# Patient Record
Sex: Male | Born: 1960 | Race: White | Hispanic: No | Marital: Single | Smoking: Never smoker
Health system: Southern US, Community
[De-identification: ages and names within clinical notes are randomized; demographics above are authoritative.]

## PROBLEM LIST (undated history)

## (undated) DIAGNOSIS — M7580 Other shoulder lesions, unspecified shoulder: Secondary | ICD-10-CM

## (undated) DIAGNOSIS — C9211 Chronic myeloid leukemia, BCR/ABL-positive, in remission: Principal | ICD-10-CM

## (undated) DIAGNOSIS — N183 Chronic kidney disease, stage 3 (moderate): Secondary | ICD-10-CM

## (undated) DIAGNOSIS — T50905A Adverse effect of unspecified drugs, medicaments and biological substances, initial encounter: Secondary | ICD-10-CM

## (undated) DIAGNOSIS — I10 Essential (primary) hypertension: Secondary | ICD-10-CM

## (undated) DIAGNOSIS — C9590 Leukemia, unspecified not having achieved remission: Secondary | ICD-10-CM

## (undated) DIAGNOSIS — D721 Eosinophilia: Secondary | ICD-10-CM

## (undated) DIAGNOSIS — R1310 Dysphagia, unspecified: Principal | ICD-10-CM

## (undated) HISTORY — DX: Essential (primary) hypertension: I10

## (undated) HISTORY — DX: Chronic myeloid leukemia, BCR/ABL-positive, in remission: C92.11

## (undated) HISTORY — DX: Dysphagia, unspecified: R13.10

## (undated) HISTORY — DX: Leukemia, unspecified not having achieved remission: C95.90

## (undated) HISTORY — DX: Adverse effect of unspecified drugs, medicaments and biological substances, initial encounter: T50.905A

## (undated) HISTORY — DX: Eosinophilia: D72.1

## (undated) HISTORY — DX: Other shoulder lesions, unspecified shoulder: M75.80

## (undated) HISTORY — DX: Chronic kidney disease, stage 3 (moderate): N18.3

---

## 2002-04-30 DIAGNOSIS — C9590 Leukemia, unspecified not having achieved remission: Secondary | ICD-10-CM

## 2002-04-30 HISTORY — DX: Leukemia, unspecified not having achieved remission: C95.90

## 2003-04-15 ENCOUNTER — Encounter: Admission: RE | Admit: 2003-04-15 | Discharge: 2003-04-15 | Payer: Self-pay | Admitting: Infectious Diseases

## 2003-04-16 ENCOUNTER — Other Ambulatory Visit: Admission: RE | Admit: 2003-04-16 | Discharge: 2003-04-16 | Payer: Self-pay | Admitting: Oncology

## 2003-04-19 DIAGNOSIS — C9211 Chronic myeloid leukemia, BCR/ABL-positive, in remission: Secondary | ICD-10-CM

## 2003-04-19 HISTORY — DX: Chronic myeloid leukemia, BCR/ABL-positive, in remission: C92.11

## 2003-04-20 ENCOUNTER — Ambulatory Visit (HOSPITAL_COMMUNITY): Admission: RE | Admit: 2003-04-20 | Discharge: 2003-04-20 | Payer: Self-pay | Admitting: Oncology

## 2003-04-20 ENCOUNTER — Encounter (INDEPENDENT_AMBULATORY_CARE_PROVIDER_SITE_OTHER): Payer: Self-pay | Admitting: *Deleted

## 2006-01-29 ENCOUNTER — Ambulatory Visit: Payer: Self-pay | Admitting: Oncology

## 2006-03-06 LAB — COMPREHENSIVE METABOLIC PANEL
ALT: 19 U/L (ref 0–53)
Albumin: 4.4 g/dL (ref 3.5–5.2)
CO2: 29 mEq/L (ref 19–32)
Calcium: 9.1 mg/dL (ref 8.4–10.5)
Chloride: 103 mEq/L (ref 96–112)
Glucose, Bld: 98 mg/dL (ref 70–99)
Potassium: 3.9 mEq/L (ref 3.5–5.3)
Sodium: 139 mEq/L (ref 135–145)
Total Bilirubin: 0.6 mg/dL (ref 0.3–1.2)
Total Protein: 6.7 g/dL (ref 6.0–8.3)

## 2006-03-06 LAB — MORPHOLOGY: PLT EST: ADEQUATE

## 2006-03-06 LAB — CBC WITH DIFFERENTIAL/PLATELET
BASO%: 0.6 % (ref 0.0–2.0)
LYMPH%: 31.3 % (ref 14.0–48.0)
MCHC: 33.9 g/dL (ref 32.0–35.9)
MCV: 92.8 fL (ref 81.6–98.0)
MONO#: 0.4 10*3/uL (ref 0.1–0.9)
MONO%: 7.1 % (ref 0.0–13.0)
NEUT#: 3.1 10*3/uL (ref 1.5–6.5)
Platelets: 194 10*3/uL (ref 145–400)
RBC: 3.48 10*6/uL — ABNORMAL LOW (ref 4.20–5.71)
RDW: 13.7 % (ref 11.2–14.6)
WBC: 5.5 10*3/uL (ref 4.0–10.0)

## 2006-03-06 LAB — LACTATE DEHYDROGENASE: LDH: 199 U/L (ref 94–250)

## 2006-04-01 ENCOUNTER — Ambulatory Visit: Payer: Self-pay | Admitting: Oncology

## 2006-04-03 LAB — CBC WITH DIFFERENTIAL/PLATELET
Basophils Absolute: 0 10*3/uL (ref 0.0–0.1)
Eosinophils Absolute: 0.3 10*3/uL (ref 0.0–0.5)
HGB: 11 g/dL — ABNORMAL LOW (ref 13.0–17.1)
MCV: 91.7 fL (ref 81.6–98.0)
MONO#: 0.5 10*3/uL (ref 0.1–0.9)
MONO%: 7.9 % (ref 0.0–13.0)
NEUT#: 3.7 10*3/uL (ref 1.5–6.5)
Platelets: 241 10*3/uL (ref 145–400)
RDW: 13.1 % (ref 11.2–14.6)
WBC: 6.2 10*3/uL (ref 4.0–10.0)

## 2006-04-03 LAB — COMPREHENSIVE METABOLIC PANEL
Albumin: 4.6 g/dL (ref 3.5–5.2)
Alkaline Phosphatase: 69 U/L (ref 39–117)
BUN: 20 mg/dL (ref 6–23)
Calcium: 9.4 mg/dL (ref 8.4–10.5)
Chloride: 104 mEq/L (ref 96–112)
Glucose, Bld: 108 mg/dL — ABNORMAL HIGH (ref 70–99)
Potassium: 4.2 mEq/L (ref 3.5–5.3)
Sodium: 142 mEq/L (ref 135–145)
Total Protein: 6.7 g/dL (ref 6.0–8.3)

## 2006-04-03 LAB — MORPHOLOGY

## 2006-05-03 LAB — CBC WITH DIFFERENTIAL/PLATELET
BASO%: 0.7 % (ref 0.0–2.0)
EOS%: 5.2 % (ref 0.0–7.0)
HCT: 33.2 % — ABNORMAL LOW (ref 38.7–49.9)
MCH: 31.1 pg (ref 28.0–33.4)
MCHC: 33.8 g/dL (ref 32.0–35.9)
MONO#: 0.4 10*3/uL (ref 0.1–0.9)
NEUT%: 60.7 % (ref 40.0–75.0)
RBC: 3.61 10*6/uL — ABNORMAL LOW (ref 4.20–5.71)
WBC: 5.8 10*3/uL (ref 4.0–10.0)
lymph#: 1.5 10*3/uL (ref 0.9–3.3)

## 2006-05-03 LAB — MORPHOLOGY: RBC Comments: NORMAL

## 2006-05-03 LAB — COMPREHENSIVE METABOLIC PANEL
AST: 25 U/L (ref 0–37)
BUN: 22 mg/dL (ref 6–23)
CO2: 28 mEq/L (ref 19–32)
Calcium: 9.3 mg/dL (ref 8.4–10.5)
Chloride: 103 mEq/L (ref 96–112)
Creatinine, Ser: 1.17 mg/dL (ref 0.40–1.50)
Total Bilirubin: 0.5 mg/dL (ref 0.3–1.2)

## 2006-05-03 LAB — LACTATE DEHYDROGENASE: LDH: 190 U/L (ref 94–250)

## 2006-05-28 ENCOUNTER — Ambulatory Visit: Payer: Self-pay | Admitting: Oncology

## 2006-05-30 LAB — CBC WITH DIFFERENTIAL/PLATELET
Basophils Absolute: 0 10*3/uL (ref 0.0–0.1)
EOS%: 5.7 % (ref 0.0–7.0)
Eosinophils Absolute: 0.3 10*3/uL (ref 0.0–0.5)
HGB: 11 g/dL — ABNORMAL LOW (ref 13.0–17.1)
NEUT#: 2.3 10*3/uL (ref 1.5–6.5)
RBC: 3.45 10*6/uL — ABNORMAL LOW (ref 4.20–5.71)
RDW: 12.8 % (ref 11.2–14.6)
WBC: 4.4 10*3/uL (ref 4.0–10.0)
lymph#: 1.2 10*3/uL (ref 0.9–3.3)

## 2006-05-30 LAB — MORPHOLOGY

## 2006-06-06 LAB — BCR/ABL GENE REARRANGEMENT QNT, PCR
BCR/ABL Previous Quant 1: NEGATIVE
BCR/ABL Quantitative 1: POSITIVE — AB
BCR/ABL ratio: 0.0005

## 2006-06-06 LAB — COMPREHENSIVE METABOLIC PANEL WITH GFR
ALT: 21 U/L (ref 0–53)
AST: 24 U/L (ref 0–37)
Albumin: 4.4 g/dL (ref 3.5–5.2)
Alkaline Phosphatase: 66 U/L (ref 39–117)
BUN: 22 mg/dL (ref 6–23)
CO2: 28 meq/L (ref 19–32)
Calcium: 8.7 mg/dL (ref 8.4–10.5)
Chloride: 103 meq/L (ref 96–112)
Creatinine, Ser: 1.15 mg/dL (ref 0.40–1.50)
Glucose, Bld: 105 mg/dL — ABNORMAL HIGH (ref 70–99)
Potassium: 3.7 meq/L (ref 3.5–5.3)
Sodium: 143 meq/L (ref 135–145)
Total Bilirubin: 0.6 mg/dL (ref 0.3–1.2)
Total Protein: 6.7 g/dL (ref 6.0–8.3)

## 2006-06-06 LAB — BCR/ABL QUALITATIVE: BCR/ABL t(9,22): POSITIVE — AB

## 2006-06-06 LAB — LACTATE DEHYDROGENASE: LDH: 181 U/L (ref 94–250)

## 2006-07-09 LAB — COMPREHENSIVE METABOLIC PANEL
Albumin: 4.4 g/dL (ref 3.5–5.2)
BUN: 17 mg/dL (ref 6–23)
CO2: 28 mEq/L (ref 19–32)
Calcium: 9.3 mg/dL (ref 8.4–10.5)
Chloride: 104 mEq/L (ref 96–112)
Glucose, Bld: 108 mg/dL — ABNORMAL HIGH (ref 70–99)
Potassium: 3.9 mEq/L (ref 3.5–5.3)

## 2006-07-09 LAB — CBC WITH DIFFERENTIAL/PLATELET
Basophils Absolute: 0 10*3/uL (ref 0.0–0.1)
EOS%: 5.8 % (ref 0.0–7.0)
HCT: 30.9 % — ABNORMAL LOW (ref 38.7–49.9)
HGB: 10.7 g/dL — ABNORMAL LOW (ref 13.0–17.1)
MCH: 31.5 pg (ref 28.0–33.4)
MCHC: 34.7 g/dL (ref 32.0–35.9)
MCV: 90.6 fL (ref 81.6–98.0)
MONO%: 9.4 % (ref 0.0–13.0)
NEUT%: 49.8 % (ref 40.0–75.0)

## 2006-08-07 ENCOUNTER — Ambulatory Visit: Payer: Self-pay | Admitting: Oncology

## 2006-08-12 LAB — CBC WITH DIFFERENTIAL/PLATELET
Eosinophils Absolute: 0.4 10*3/uL (ref 0.0–0.5)
HGB: 11.1 g/dL — ABNORMAL LOW (ref 13.0–17.1)
MONO#: 0.6 10*3/uL (ref 0.1–0.9)
MONO%: 9.9 % (ref 0.0–13.0)
NEUT#: 2.6 10*3/uL (ref 1.5–6.5)
RBC: 3.53 10*6/uL — ABNORMAL LOW (ref 4.20–5.71)
RDW: 13.3 % (ref 11.2–14.6)
WBC: 5.7 10*3/uL (ref 4.0–10.0)
lymph#: 2.1 10*3/uL (ref 0.9–3.3)

## 2006-08-12 LAB — MORPHOLOGY: PLT EST: ADEQUATE

## 2006-08-12 LAB — COMPREHENSIVE METABOLIC PANEL
ALT: 18 U/L (ref 0–53)
AST: 22 U/L (ref 0–37)
Albumin: 4.7 g/dL (ref 3.5–5.2)
Alkaline Phosphatase: 76 U/L (ref 39–117)
BUN: 18 mg/dL (ref 6–23)
Potassium: 4.3 mEq/L (ref 3.5–5.3)

## 2006-09-09 LAB — CBC WITH DIFFERENTIAL/PLATELET
Basophils Absolute: 0 10*3/uL (ref 0.0–0.1)
Eosinophils Absolute: 0.4 10*3/uL (ref 0.0–0.5)
HGB: 11.2 g/dL — ABNORMAL LOW (ref 13.0–17.1)
LYMPH%: 34.3 % (ref 14.0–48.0)
MCV: 91.1 fL (ref 81.6–98.0)
MONO%: 8.7 % (ref 0.0–13.0)
NEUT#: 2.3 10*3/uL (ref 1.5–6.5)
Platelets: 208 10*3/uL (ref 145–400)
RBC: 3.53 10*6/uL — ABNORMAL LOW (ref 4.20–5.71)

## 2006-09-09 LAB — MORPHOLOGY

## 2006-09-11 LAB — COMPREHENSIVE METABOLIC PANEL
AST: 20 U/L (ref 0–37)
Albumin: 4.7 g/dL (ref 3.5–5.2)
BUN: 17 mg/dL (ref 6–23)
CO2: 33 mEq/L — ABNORMAL HIGH (ref 19–32)
Calcium: 9.6 mg/dL (ref 8.4–10.5)
Chloride: 103 mEq/L (ref 96–112)
Creatinine, Ser: 1.25 mg/dL (ref 0.40–1.50)
Glucose, Bld: 101 mg/dL — ABNORMAL HIGH (ref 70–99)
Potassium: 4 mEq/L (ref 3.5–5.3)

## 2006-09-11 LAB — LACTATE DEHYDROGENASE: LDH: 175 U/L (ref 94–250)

## 2006-09-17 LAB — BCR/ABL GENE REARRANGEMENT QNT, PCR

## 2006-10-10 ENCOUNTER — Ambulatory Visit: Payer: Self-pay | Admitting: Oncology

## 2006-10-14 LAB — CBC WITH DIFFERENTIAL/PLATELET
Basophils Absolute: 0 10*3/uL (ref 0.0–0.1)
Eosinophils Absolute: 0.2 10*3/uL (ref 0.0–0.5)
HGB: 10.7 g/dL — ABNORMAL LOW (ref 13.0–17.1)
MONO#: 0.7 10*3/uL (ref 0.1–0.9)
NEUT#: 4.2 10*3/uL (ref 1.5–6.5)
Platelets: 207 10*3/uL (ref 145–400)
RBC: 3.38 10*6/uL — ABNORMAL LOW (ref 4.20–5.71)
RDW: 12.8 % (ref 11.2–14.6)
WBC: 6.9 10*3/uL (ref 4.0–10.0)

## 2006-10-14 LAB — COMPREHENSIVE METABOLIC PANEL
Albumin: 4.5 g/dL (ref 3.5–5.2)
BUN: 17 mg/dL (ref 6–23)
Calcium: 9.3 mg/dL (ref 8.4–10.5)
Chloride: 107 mEq/L (ref 96–112)
Glucose, Bld: 88 mg/dL (ref 70–99)
Potassium: 4 mEq/L (ref 3.5–5.3)

## 2006-11-11 LAB — CBC WITH DIFFERENTIAL/PLATELET
Eosinophils Absolute: 0.3 10*3/uL (ref 0.0–0.5)
MONO#: 0.4 10*3/uL (ref 0.1–0.9)
MONO%: 7.4 % (ref 0.0–13.0)
NEUT#: 2.5 10*3/uL (ref 1.5–6.5)
RBC: 3.4 10*6/uL — ABNORMAL LOW (ref 4.20–5.71)
RDW: 12.7 % (ref 11.2–14.6)
WBC: 4.7 10*3/uL (ref 4.0–10.0)

## 2006-11-11 LAB — COMPREHENSIVE METABOLIC PANEL
ALT: 12 U/L (ref 0–53)
Albumin: 4.5 g/dL (ref 3.5–5.2)
CO2: 26 mEq/L (ref 19–32)
Calcium: 9.4 mg/dL (ref 8.4–10.5)
Chloride: 102 mEq/L (ref 96–112)
Glucose, Bld: 109 mg/dL — ABNORMAL HIGH (ref 70–99)
Potassium: 4 mEq/L (ref 3.5–5.3)
Sodium: 142 mEq/L (ref 135–145)
Total Protein: 6.5 g/dL (ref 6.0–8.3)

## 2006-11-11 LAB — MORPHOLOGY: PLT EST: ADEQUATE

## 2006-12-12 ENCOUNTER — Ambulatory Visit: Payer: Self-pay | Admitting: Oncology

## 2006-12-16 LAB — COMPREHENSIVE METABOLIC PANEL
Albumin: 4.5 g/dL (ref 3.5–5.2)
Alkaline Phosphatase: 71 U/L (ref 39–117)
Glucose, Bld: 83 mg/dL (ref 70–99)
Potassium: 4 mEq/L (ref 3.5–5.3)
Sodium: 139 mEq/L (ref 135–145)
Total Protein: 6.9 g/dL (ref 6.0–8.3)

## 2006-12-16 LAB — CBC WITH DIFFERENTIAL/PLATELET
Basophils Absolute: 0 10*3/uL (ref 0.0–0.1)
Eosinophils Absolute: 0.3 10*3/uL (ref 0.0–0.5)
HCT: 32.7 % — ABNORMAL LOW (ref 38.7–49.9)
HGB: 11.4 g/dL — ABNORMAL LOW (ref 13.0–17.1)
LYMPH%: 24 % (ref 14.0–48.0)
MCV: 89.9 fL (ref 81.6–98.0)
MONO%: 9.6 % (ref 0.0–13.0)
NEUT#: 4.4 10*3/uL (ref 1.5–6.5)
Platelets: 221 10*3/uL (ref 145–400)
RDW: 12.3 % (ref 11.2–14.6)

## 2006-12-16 LAB — MORPHOLOGY

## 2006-12-16 LAB — LACTATE DEHYDROGENASE: LDH: 187 U/L (ref 94–250)

## 2006-12-25 LAB — BCR/ABL

## 2007-01-31 ENCOUNTER — Ambulatory Visit: Payer: Self-pay | Admitting: Oncology

## 2007-02-04 LAB — COMPREHENSIVE METABOLIC PANEL
AST: 20 U/L (ref 0–37)
Alkaline Phosphatase: 69 U/L (ref 39–117)
BUN: 17 mg/dL (ref 6–23)
Calcium: 9.2 mg/dL (ref 8.4–10.5)
Creatinine, Ser: 1.18 mg/dL (ref 0.40–1.50)
Total Bilirubin: 0.7 mg/dL (ref 0.3–1.2)

## 2007-02-04 LAB — CBC WITH DIFFERENTIAL/PLATELET
BASO%: 0.5 % (ref 0.0–2.0)
EOS%: 5.7 % (ref 0.0–7.0)
MCH: 31.5 pg (ref 28.0–33.4)
MCV: 89 fL (ref 81.6–98.0)
MONO%: 9.4 % (ref 0.0–13.0)
RBC: 3.69 10*6/uL — ABNORMAL LOW (ref 4.20–5.71)
RDW: 12.5 % (ref 11.2–14.6)
lymph#: 1.7 10*3/uL (ref 0.9–3.3)

## 2007-02-04 LAB — MORPHOLOGY
PLT EST: ADEQUATE
RBC Comments: NORMAL

## 2007-03-04 LAB — COMPREHENSIVE METABOLIC PANEL
ALT: 16 U/L (ref 0–53)
AST: 20 U/L (ref 0–37)
Albumin: 4.4 g/dL (ref 3.5–5.2)
BUN: 15 mg/dL (ref 6–23)
CO2: 27 mEq/L (ref 19–32)
Calcium: 9.7 mg/dL (ref 8.4–10.5)
Chloride: 101 mEq/L (ref 96–112)
Potassium: 3.9 mEq/L (ref 3.5–5.3)

## 2007-03-04 LAB — CBC WITH DIFFERENTIAL/PLATELET
Basophils Absolute: 0 10*3/uL (ref 0.0–0.1)
EOS%: 6.7 % (ref 0.0–7.0)
HCT: 32.3 % — ABNORMAL LOW (ref 38.7–49.9)
HGB: 11.4 g/dL — ABNORMAL LOW (ref 13.0–17.1)
LYMPH%: 36.1 % (ref 14.0–48.0)
MCH: 31.4 pg (ref 28.0–33.4)
MCHC: 35.4 g/dL (ref 32.0–35.9)
MCV: 88.7 fL (ref 81.6–98.0)
NEUT%: 47.1 % (ref 40.0–75.0)
Platelets: 215 10*3/uL (ref 145–400)
lymph#: 2 10*3/uL (ref 0.9–3.3)

## 2007-03-04 LAB — LACTATE DEHYDROGENASE: LDH: 174 U/L (ref 94–250)

## 2007-03-04 LAB — MORPHOLOGY

## 2007-03-26 ENCOUNTER — Ambulatory Visit: Payer: Self-pay | Admitting: Oncology

## 2007-04-01 LAB — COMPREHENSIVE METABOLIC PANEL
ALT: 15 U/L (ref 0–53)
Albumin: 4.5 g/dL (ref 3.5–5.2)
CO2: 27 mEq/L (ref 19–32)
Calcium: 9.5 mg/dL (ref 8.4–10.5)
Chloride: 102 mEq/L (ref 96–112)
Potassium: 4.2 mEq/L (ref 3.5–5.3)
Sodium: 140 mEq/L (ref 135–145)
Total Protein: 7 g/dL (ref 6.0–8.3)

## 2007-04-01 LAB — CBC WITH DIFFERENTIAL/PLATELET
EOS%: 5.2 % (ref 0.0–7.0)
MCH: 31.2 pg (ref 28.0–33.4)
MCV: 88.3 fL (ref 81.6–98.0)
MONO%: 8.5 % (ref 0.0–13.0)
NEUT#: 2.2 10*3/uL (ref 1.5–6.5)
RBC: 3.67 10*6/uL — ABNORMAL LOW (ref 4.20–5.71)
RDW: 12.4 % (ref 11.2–14.6)

## 2007-04-01 LAB — MORPHOLOGY: RBC Comments: NORMAL

## 2007-04-01 LAB — LACTATE DEHYDROGENASE: LDH: 169 U/L (ref 94–250)

## 2007-05-08 ENCOUNTER — Ambulatory Visit: Payer: Self-pay | Admitting: Oncology

## 2007-05-12 LAB — CBC WITH DIFFERENTIAL/PLATELET
Basophils Absolute: 0.1 10*3/uL (ref 0.0–0.1)
EOS%: 5.6 % (ref 0.0–7.0)
HGB: 11.2 g/dL — ABNORMAL LOW (ref 13.0–17.1)
MCH: 30.1 pg (ref 28.0–33.4)
MCHC: 33.9 g/dL (ref 32.0–35.9)
MCV: 88.8 fL (ref 81.6–98.0)
MONO%: 7.7 % (ref 0.0–13.0)
RBC: 3.72 10*6/uL — ABNORMAL LOW (ref 4.20–5.71)
RDW: 12.4 % (ref 11.2–14.6)

## 2007-05-12 LAB — COMPREHENSIVE METABOLIC PANEL
ALT: 15 U/L (ref 0–53)
AST: 18 U/L (ref 0–37)
Alkaline Phosphatase: 70 U/L (ref 39–117)
Calcium: 9.5 mg/dL (ref 8.4–10.5)
Chloride: 106 mEq/L (ref 96–112)
Creatinine, Ser: 1.09 mg/dL (ref 0.40–1.50)
Potassium: 3.9 mEq/L (ref 3.5–5.3)

## 2007-05-12 LAB — MORPHOLOGY

## 2007-06-09 LAB — COMPREHENSIVE METABOLIC PANEL
AST: 22 U/L (ref 0–37)
Alkaline Phosphatase: 71 U/L (ref 39–117)
BUN: 21 mg/dL (ref 6–23)
Calcium: 9 mg/dL (ref 8.4–10.5)
Creatinine, Ser: 1.2 mg/dL (ref 0.40–1.50)
Glucose, Bld: 95 mg/dL (ref 70–99)

## 2007-06-09 LAB — CBC WITH DIFFERENTIAL/PLATELET
EOS%: 4.2 % (ref 0.0–7.0)
MCH: 29.2 pg (ref 28.0–33.4)
MCHC: 32.8 g/dL (ref 32.0–35.9)
MCV: 88.9 fL (ref 81.6–98.0)
MONO%: 7.3 % (ref 0.0–13.0)
RBC: 3.94 10*6/uL — ABNORMAL LOW (ref 4.20–5.71)
RDW: 12.4 % (ref 11.2–14.6)

## 2007-06-09 LAB — MORPHOLOGY: RBC Comments: NORMAL

## 2007-07-03 ENCOUNTER — Ambulatory Visit: Payer: Self-pay | Admitting: Oncology

## 2007-07-07 LAB — COMPREHENSIVE METABOLIC PANEL
Albumin: 4.4 g/dL (ref 3.5–5.2)
Alkaline Phosphatase: 73 U/L (ref 39–117)
BUN: 19 mg/dL (ref 6–23)
CO2: 25 mEq/L (ref 19–32)
Calcium: 8.9 mg/dL (ref 8.4–10.5)
Glucose, Bld: 87 mg/dL (ref 70–99)
Potassium: 3.8 mEq/L (ref 3.5–5.3)

## 2007-07-07 LAB — CBC WITH DIFFERENTIAL/PLATELET
Eosinophils Absolute: 0.2 10*3/uL (ref 0.0–0.5)
LYMPH%: 34.8 % (ref 14.0–48.0)
MCHC: 34.7 g/dL (ref 32.0–35.9)
MCV: 88.5 fL (ref 81.6–98.0)
MONO%: 10.2 % (ref 0.0–13.0)
NEUT#: 2.5 10*3/uL (ref 1.5–6.5)
NEUT%: 49.9 % (ref 40.0–75.0)
Platelets: 213 10*3/uL (ref 145–400)
RBC: 3.64 10*6/uL — ABNORMAL LOW (ref 4.20–5.71)

## 2007-07-07 LAB — MORPHOLOGY: RBC Comments: NORMAL

## 2007-08-04 LAB — COMPREHENSIVE METABOLIC PANEL
ALT: 14 U/L (ref 0–53)
AST: 16 U/L (ref 0–37)
Albumin: 4.4 g/dL (ref 3.5–5.2)
CO2: 26 mEq/L (ref 19–32)
Calcium: 9.3 mg/dL (ref 8.4–10.5)
Chloride: 105 mEq/L (ref 96–112)
Creatinine, Ser: 1.11 mg/dL (ref 0.40–1.50)
Potassium: 4 mEq/L (ref 3.5–5.3)
Total Protein: 6.7 g/dL (ref 6.0–8.3)

## 2007-08-04 LAB — CBC WITH DIFFERENTIAL/PLATELET
BASO%: 0.5 % (ref 0.0–2.0)
Basophils Absolute: 0 10*3/uL (ref 0.0–0.1)
EOS%: 3.7 % (ref 0.0–7.0)
HGB: 11.1 g/dL — ABNORMAL LOW (ref 13.0–17.1)
MCH: 30.9 pg (ref 28.0–33.4)
MONO#: 0.5 10*3/uL (ref 0.1–0.9)
NEUT#: 2.7 10*3/uL (ref 1.5–6.5)
RDW: 13.2 % (ref 11.2–14.6)
WBC: 5.4 10*3/uL (ref 4.0–10.0)
lymph#: 1.9 10*3/uL (ref 0.9–3.3)

## 2007-08-04 LAB — MORPHOLOGY: PLT EST: ADEQUATE

## 2007-08-04 LAB — LACTATE DEHYDROGENASE: LDH: 155 U/L (ref 94–250)

## 2007-08-13 ENCOUNTER — Ambulatory Visit: Payer: Self-pay | Admitting: Oncology

## 2007-09-17 LAB — CBC WITH DIFFERENTIAL/PLATELET
Basophils Absolute: 0 10*3/uL (ref 0.0–0.1)
EOS%: 5.4 % (ref 0.0–7.0)
Eosinophils Absolute: 0.3 10*3/uL (ref 0.0–0.5)
HGB: 11.5 g/dL — ABNORMAL LOW (ref 13.0–17.1)
NEUT#: 2.4 10*3/uL (ref 1.5–6.5)
RDW: 12.5 % (ref 11.2–14.6)
WBC: 5 10*3/uL (ref 4.0–10.0)
lymph#: 1.9 10*3/uL (ref 0.9–3.3)

## 2007-09-17 LAB — COMPREHENSIVE METABOLIC PANEL
ALT: 17 U/L (ref 0–53)
BUN: 18 mg/dL (ref 6–23)
CO2: 24 mEq/L (ref 19–32)
Calcium: 9.4 mg/dL (ref 8.4–10.5)
Chloride: 104 mEq/L (ref 96–112)
Creatinine, Ser: 1.19 mg/dL (ref 0.40–1.50)
Glucose, Bld: 91 mg/dL (ref 70–99)

## 2007-09-17 LAB — MORPHOLOGY: PLT EST: ADEQUATE

## 2007-09-17 LAB — LACTATE DEHYDROGENASE: LDH: 183 U/L (ref 94–250)

## 2007-10-13 ENCOUNTER — Ambulatory Visit: Payer: Self-pay | Admitting: Oncology

## 2007-10-15 LAB — CBC WITH DIFFERENTIAL/PLATELET
Basophils Absolute: 0 10*3/uL (ref 0.0–0.1)
EOS%: 6.8 % (ref 0.0–7.0)
HCT: 33.3 % — ABNORMAL LOW (ref 38.7–49.9)
HGB: 11.5 g/dL — ABNORMAL LOW (ref 13.0–17.1)
MCH: 31.2 pg (ref 28.0–33.4)
MCHC: 34.6 g/dL (ref 32.0–35.9)
MCV: 90.2 fL (ref 81.6–98.0)
MONO%: 8.6 % (ref 0.0–13.0)
NEUT%: 51.6 % (ref 40.0–75.0)
RDW: 12.6 % (ref 11.2–14.6)

## 2007-10-15 LAB — COMPREHENSIVE METABOLIC PANEL
BUN: 20 mg/dL (ref 6–23)
CO2: 26 mEq/L (ref 19–32)
Calcium: 9.4 mg/dL (ref 8.4–10.5)
Chloride: 104 mEq/L (ref 96–112)
Creatinine, Ser: 1.08 mg/dL (ref 0.40–1.50)

## 2007-10-15 LAB — LACTATE DEHYDROGENASE: LDH: 167 U/L (ref 94–250)

## 2007-11-12 LAB — CBC WITH DIFFERENTIAL/PLATELET
Basophils Absolute: 0.1 10*3/uL (ref 0.0–0.1)
Eosinophils Absolute: 0.4 10*3/uL (ref 0.0–0.5)
HGB: 11.6 g/dL — ABNORMAL LOW (ref 13.0–17.1)
NEUT#: 2.5 10*3/uL (ref 1.5–6.5)
RDW: 11.7 % (ref 11.2–14.6)
lymph#: 2.1 10*3/uL (ref 0.9–3.3)

## 2007-11-12 LAB — MORPHOLOGY
Blasts: 1 % (ref 0–0)
PLT EST: ADEQUATE

## 2007-11-12 LAB — COMPREHENSIVE METABOLIC PANEL
ALT: 16 U/L (ref 0–53)
Alkaline Phosphatase: 69 U/L (ref 39–117)
Glucose, Bld: 97 mg/dL (ref 70–99)
Sodium: 139 mEq/L (ref 135–145)
Total Bilirubin: 0.5 mg/dL (ref 0.3–1.2)
Total Protein: 6.6 g/dL (ref 6.0–8.3)

## 2007-12-04 ENCOUNTER — Ambulatory Visit: Payer: Self-pay | Admitting: Oncology

## 2007-12-08 LAB — MORPHOLOGY: PLT EST: ADEQUATE

## 2007-12-08 LAB — COMPREHENSIVE METABOLIC PANEL
AST: 19 U/L (ref 0–37)
BUN: 18 mg/dL (ref 6–23)
CO2: 25 mEq/L (ref 19–32)
Calcium: 9.1 mg/dL (ref 8.4–10.5)
Chloride: 105 mEq/L (ref 96–112)
Creatinine, Ser: 1.24 mg/dL (ref 0.40–1.50)

## 2007-12-08 LAB — CBC WITH DIFFERENTIAL/PLATELET
Basophils Absolute: 0 10*3/uL (ref 0.0–0.1)
Eosinophils Absolute: 0.3 10*3/uL (ref 0.0–0.5)
HGB: 11.4 g/dL — ABNORMAL LOW (ref 13.0–17.1)
MCV: 91.7 fL (ref 81.6–98.0)
MONO#: 0.4 10*3/uL (ref 0.1–0.9)
MONO%: 7.8 % (ref 0.0–13.0)
NEUT#: 2.2 10*3/uL (ref 1.5–6.5)
RDW: 12.7 % (ref 11.2–14.6)
lymph#: 1.7 10*3/uL (ref 0.9–3.3)

## 2007-12-08 LAB — LACTATE DEHYDROGENASE: LDH: 178 U/L (ref 94–250)

## 2008-01-20 ENCOUNTER — Ambulatory Visit: Payer: Self-pay | Admitting: Oncology

## 2008-01-20 LAB — COMPREHENSIVE METABOLIC PANEL
AST: 18 U/L (ref 0–37)
BUN: 15 mg/dL (ref 6–23)
Calcium: 8.7 mg/dL (ref 8.4–10.5)
Chloride: 103 mEq/L (ref 96–112)
Creatinine, Ser: 1.19 mg/dL (ref 0.40–1.50)

## 2008-01-20 LAB — CBC WITH DIFFERENTIAL/PLATELET
BASO%: 0.7 % (ref 0.0–2.0)
Basophils Absolute: 0 10*3/uL (ref 0.0–0.1)
EOS%: 5.6 % (ref 0.0–7.0)
HCT: 32.4 % — ABNORMAL LOW (ref 38.7–49.9)
HGB: 11.3 g/dL — ABNORMAL LOW (ref 13.0–17.1)
MCH: 31.7 pg (ref 28.0–33.4)
MCHC: 34.8 g/dL (ref 32.0–35.9)
MCV: 91 fL (ref 81.6–98.0)
MONO%: 8.6 % (ref 0.0–13.0)
NEUT%: 52.3 % (ref 40.0–75.0)
RDW: 12.7 % (ref 11.2–14.6)

## 2008-02-16 LAB — COMPREHENSIVE METABOLIC PANEL
ALT: 17 U/L (ref 0–53)
AST: 22 U/L (ref 0–37)
CO2: 27 mEq/L (ref 19–32)
Calcium: 8.7 mg/dL (ref 8.4–10.5)
Chloride: 103 mEq/L (ref 96–112)
Creatinine, Ser: 1.15 mg/dL (ref 0.40–1.50)
Potassium: 3.9 mEq/L (ref 3.5–5.3)
Sodium: 138 mEq/L (ref 135–145)
Total Protein: 6.8 g/dL (ref 6.0–8.3)

## 2008-02-16 LAB — LACTATE DEHYDROGENASE: LDH: 181 U/L (ref 94–250)

## 2008-02-16 LAB — CBC WITH DIFFERENTIAL/PLATELET
BASO%: 0.8 % (ref 0.0–2.0)
Basophils Absolute: 0 10*3/uL (ref 0.0–0.1)
Eosinophils Absolute: 0.3 10*3/uL (ref 0.0–0.5)
HCT: 32.8 % — ABNORMAL LOW (ref 38.7–49.9)
HGB: 11.3 g/dL — ABNORMAL LOW (ref 13.0–17.1)
LYMPH%: 36.3 % (ref 14.0–48.0)
MCHC: 34.5 g/dL (ref 32.0–35.9)
MONO#: 0.5 10*3/uL (ref 0.1–0.9)
NEUT#: 2.5 10*3/uL (ref 1.5–6.5)
NEUT%: 48.2 % (ref 40.0–75.0)
Platelets: 206 10*3/uL (ref 145–400)
WBC: 5.2 10*3/uL (ref 4.0–10.0)
lymph#: 1.9 10*3/uL (ref 0.9–3.3)

## 2008-02-16 LAB — MORPHOLOGY: PLT EST: ADEQUATE

## 2008-03-11 ENCOUNTER — Ambulatory Visit: Payer: Self-pay | Admitting: Oncology

## 2008-03-15 LAB — CBC WITH DIFFERENTIAL/PLATELET
Basophils Absolute: 0 10*3/uL (ref 0.0–0.1)
Eosinophils Absolute: 0.3 10*3/uL (ref 0.0–0.5)
HCT: 32 % — ABNORMAL LOW (ref 38.7–49.9)
HGB: 11.2 g/dL — ABNORMAL LOW (ref 13.0–17.1)
MCV: 91.2 fL (ref 81.6–98.0)
MONO%: 8.9 % (ref 0.0–13.0)
NEUT#: 3 10*3/uL (ref 1.5–6.5)
NEUT%: 52.2 % (ref 40.0–75.0)
Platelets: 202 10*3/uL (ref 145–400)
RDW: 12.8 % (ref 11.2–14.6)

## 2008-03-15 LAB — MORPHOLOGY

## 2008-03-15 LAB — COMPREHENSIVE METABOLIC PANEL
ALT: 17 U/L (ref 0–53)
AST: 19 U/L (ref 0–37)
Albumin: 4.4 g/dL (ref 3.5–5.2)
BUN: 18 mg/dL (ref 6–23)
Calcium: 9 mg/dL (ref 8.4–10.5)
Chloride: 104 mEq/L (ref 96–112)
Potassium: 4 mEq/L (ref 3.5–5.3)

## 2008-04-20 LAB — COMPREHENSIVE METABOLIC PANEL
ALT: 17 U/L (ref 0–53)
Albumin: 4.3 g/dL (ref 3.5–5.2)
CO2: 25 mEq/L (ref 19–32)
Calcium: 8.9 mg/dL (ref 8.4–10.5)
Chloride: 106 mEq/L (ref 96–112)
Creatinine, Ser: 1.15 mg/dL (ref 0.40–1.50)

## 2008-04-20 LAB — MORPHOLOGY: PLT EST: ADEQUATE

## 2008-04-20 LAB — CBC WITH DIFFERENTIAL/PLATELET
Basophils Absolute: 0 10*3/uL (ref 0.0–0.1)
EOS%: 7.7 % — ABNORMAL HIGH (ref 0.0–7.0)
HCT: 32 % — ABNORMAL LOW (ref 38.7–49.9)
HGB: 11 g/dL — ABNORMAL LOW (ref 13.0–17.1)
MCH: 31.5 pg (ref 28.0–33.4)
MCV: 91.5 fL (ref 81.6–98.0)
MONO%: 9.6 % (ref 0.0–13.0)
NEUT%: 48 % (ref 40.0–75.0)

## 2008-04-20 LAB — LACTATE DEHYDROGENASE: LDH: 163 U/L (ref 94–250)

## 2008-04-28 ENCOUNTER — Ambulatory Visit: Payer: Self-pay | Admitting: Oncology

## 2008-06-17 ENCOUNTER — Ambulatory Visit: Payer: Self-pay | Admitting: Oncology

## 2008-06-21 LAB — CBC WITH DIFFERENTIAL/PLATELET
Basophils Absolute: 0 10*3/uL (ref 0.0–0.1)
Eosinophils Absolute: 0.3 10*3/uL (ref 0.0–0.5)
HCT: 33.3 % — ABNORMAL LOW (ref 38.4–49.9)
HGB: 11.5 g/dL — ABNORMAL LOW (ref 13.0–17.1)
MCV: 91.4 fL (ref 79.3–98.0)
MONO%: 9.8 % (ref 0.0–14.0)
NEUT#: 2.6 10*3/uL (ref 1.5–6.5)
NEUT%: 52.6 % (ref 39.0–75.0)
Platelets: 198 10*3/uL (ref 140–400)
RDW: 12.6 % (ref 11.0–14.6)

## 2008-06-21 LAB — COMPREHENSIVE METABOLIC PANEL
ALT: 22 U/L (ref 0–53)
AST: 26 U/L (ref 0–37)
Alkaline Phosphatase: 68 U/L (ref 39–117)
Creatinine, Ser: 1.06 mg/dL (ref 0.40–1.50)
Total Bilirubin: 0.5 mg/dL (ref 0.3–1.2)

## 2008-06-21 LAB — MORPHOLOGY

## 2008-07-19 LAB — CBC WITH DIFFERENTIAL/PLATELET
Basophils Absolute: 0 10*3/uL (ref 0.0–0.1)
Eosinophils Absolute: 0.3 10*3/uL (ref 0.0–0.5)
HGB: 11.4 g/dL — ABNORMAL LOW (ref 13.0–17.1)
MCV: 90.9 fL (ref 79.3–98.0)
MONO#: 0.5 10*3/uL (ref 0.1–0.9)
NEUT#: 2.3 10*3/uL (ref 1.5–6.5)
RBC: 3.64 10*6/uL — ABNORMAL LOW (ref 4.20–5.82)
RDW: 12.5 % (ref 11.0–14.6)
WBC: 4.8 10*3/uL (ref 4.0–10.3)
lymph#: 1.7 10*3/uL (ref 0.9–3.3)

## 2008-07-19 LAB — MORPHOLOGY: PLT EST: ADEQUATE

## 2008-07-19 LAB — COMPREHENSIVE METABOLIC PANEL
AST: 24 U/L (ref 0–37)
Alkaline Phosphatase: 69 U/L (ref 39–117)
BUN: 13 mg/dL (ref 6–23)
Calcium: 8.9 mg/dL (ref 8.4–10.5)
Creatinine, Ser: 1.21 mg/dL (ref 0.40–1.50)
Total Bilirubin: 0.6 mg/dL (ref 0.3–1.2)

## 2008-08-12 ENCOUNTER — Ambulatory Visit: Payer: Self-pay | Admitting: Oncology

## 2008-08-16 LAB — COMPREHENSIVE METABOLIC PANEL
ALT: 14 U/L (ref 0–53)
BUN: 16 mg/dL (ref 6–23)
CO2: 26 mEq/L (ref 19–32)
Calcium: 9 mg/dL (ref 8.4–10.5)
Chloride: 104 mEq/L (ref 96–112)
Creatinine, Ser: 1.09 mg/dL (ref 0.40–1.50)
Total Bilirubin: 0.4 mg/dL (ref 0.3–1.2)

## 2008-08-16 LAB — MORPHOLOGY: PLT EST: ADEQUATE

## 2008-08-16 LAB — CBC WITH DIFFERENTIAL/PLATELET
Basophils Absolute: 0 10*3/uL (ref 0.0–0.1)
Eosinophils Absolute: 0.3 10*3/uL (ref 0.0–0.5)
HGB: 11.1 g/dL — ABNORMAL LOW (ref 13.0–17.1)
NEUT#: 2 10*3/uL (ref 1.5–6.5)
RBC: 3.58 10*6/uL — ABNORMAL LOW (ref 4.20–5.82)
RDW: 12.7 % (ref 11.0–14.6)
WBC: 4.4 10*3/uL (ref 4.0–10.3)
lymph#: 1.7 10*3/uL (ref 0.9–3.3)

## 2008-08-16 LAB — LACTATE DEHYDROGENASE: LDH: 148 U/L (ref 94–250)

## 2008-10-13 ENCOUNTER — Ambulatory Visit: Payer: Self-pay | Admitting: Oncology

## 2008-10-18 LAB — MORPHOLOGY: PLT EST: ADEQUATE

## 2008-10-18 LAB — COMPREHENSIVE METABOLIC PANEL
ALT: 15 U/L (ref 0–53)
AST: 22 U/L (ref 0–37)
Alkaline Phosphatase: 60 U/L (ref 39–117)
CO2: 29 mEq/L (ref 19–32)
Creatinine, Ser: 1.14 mg/dL (ref 0.40–1.50)
Sodium: 138 mEq/L (ref 135–145)
Total Bilirubin: 0.9 mg/dL (ref 0.3–1.2)
Total Protein: 6.5 g/dL (ref 6.0–8.3)

## 2008-10-18 LAB — CBC WITH DIFFERENTIAL/PLATELET
Basophils Absolute: 0 10*3/uL (ref 0.0–0.1)
Eosinophils Absolute: 0.3 10*3/uL (ref 0.0–0.5)
HGB: 11.6 g/dL — ABNORMAL LOW (ref 13.0–17.1)
MCV: 91.5 fL (ref 79.3–98.0)
MONO#: 0.4 10*3/uL (ref 0.1–0.9)
MONO%: 8.6 % (ref 0.0–14.0)
NEUT#: 2.1 10*3/uL (ref 1.5–6.5)
Platelets: 175 10*3/uL (ref 140–400)
RDW: 13 % (ref 11.0–14.6)
WBC: 4.4 10*3/uL (ref 4.0–10.3)

## 2008-12-15 ENCOUNTER — Ambulatory Visit: Payer: Self-pay | Admitting: Oncology

## 2009-02-24 ENCOUNTER — Ambulatory Visit: Payer: Self-pay | Admitting: Oncology

## 2009-02-28 LAB — MORPHOLOGY
PLT EST: ADEQUATE
RBC Comments: NORMAL

## 2009-02-28 LAB — CBC WITH DIFFERENTIAL/PLATELET
BASO%: 0.4 % (ref 0.0–2.0)
HCT: 33.3 % — ABNORMAL LOW (ref 38.4–49.9)
MCHC: 34 g/dL (ref 32.0–36.0)
MONO#: 0.5 10*3/uL (ref 0.1–0.9)
RBC: 3.54 10*6/uL — ABNORMAL LOW (ref 4.20–5.82)
WBC: 6.3 10*3/uL (ref 4.0–10.3)
lymph#: 1.9 10*3/uL (ref 0.9–3.3)

## 2009-02-28 LAB — COMPREHENSIVE METABOLIC PANEL
ALT: 21 U/L (ref 0–53)
Alkaline Phosphatase: 83 U/L (ref 39–117)
Sodium: 138 mEq/L (ref 135–145)
Total Bilirubin: 0.4 mg/dL (ref 0.3–1.2)
Total Protein: 6.7 g/dL (ref 6.0–8.3)

## 2009-04-27 ENCOUNTER — Ambulatory Visit: Payer: Self-pay | Admitting: Oncology

## 2009-05-02 LAB — CBC WITH DIFFERENTIAL/PLATELET
BASO%: 0.2 % (ref 0.0–2.0)
EOS%: 3.4 % (ref 0.0–7.0)
HCT: 33.1 % — ABNORMAL LOW (ref 38.4–49.9)
LYMPH%: 22.4 % (ref 14.0–49.0)
MCH: 31.8 pg (ref 27.2–33.4)
MCHC: 34.2 g/dL (ref 32.0–36.0)
MONO%: 9.1 % (ref 0.0–14.0)
NEUT%: 64.9 % (ref 39.0–75.0)
Platelets: 184 10*3/uL (ref 140–400)

## 2009-05-02 LAB — COMPREHENSIVE METABOLIC PANEL
BUN: 17 mg/dL (ref 6–23)
CO2: 26 mEq/L (ref 19–32)
Creatinine, Ser: 1.16 mg/dL (ref 0.40–1.50)
Glucose, Bld: 107 mg/dL — ABNORMAL HIGH (ref 70–99)
Total Bilirubin: 0.4 mg/dL (ref 0.3–1.2)
Total Protein: 6.4 g/dL (ref 6.0–8.3)

## 2009-05-02 LAB — LACTATE DEHYDROGENASE: LDH: 162 U/L (ref 94–250)

## 2009-05-10 LAB — BCR/ABL

## 2009-07-12 ENCOUNTER — Ambulatory Visit: Payer: Self-pay | Admitting: Oncology

## 2009-07-14 LAB — MORPHOLOGY: PLT EST: ADEQUATE

## 2009-07-14 LAB — COMPREHENSIVE METABOLIC PANEL
ALT: 21 U/L (ref 0–53)
CO2: 25 mEq/L (ref 19–32)
Calcium: 9 mg/dL (ref 8.4–10.5)
Chloride: 102 mEq/L (ref 96–112)
Potassium: 3.9 mEq/L (ref 3.5–5.3)
Sodium: 137 mEq/L (ref 135–145)
Total Protein: 6.6 g/dL (ref 6.0–8.3)

## 2009-07-14 LAB — CBC WITH DIFFERENTIAL/PLATELET
Basophils Absolute: 0 10*3/uL (ref 0.0–0.1)
Eosinophils Absolute: 0.3 10*3/uL (ref 0.0–0.5)
HCT: 33 % — ABNORMAL LOW (ref 38.4–49.9)
HGB: 11.4 g/dL — ABNORMAL LOW (ref 13.0–17.1)
MCH: 31.8 pg (ref 27.2–33.4)
MONO#: 0.5 10*3/uL (ref 0.1–0.9)
NEUT%: 52.5 % (ref 39.0–75.0)
lymph#: 1.7 10*3/uL (ref 0.9–3.3)

## 2009-07-14 LAB — LACTATE DEHYDROGENASE: LDH: 155 U/L (ref 94–250)

## 2009-09-12 ENCOUNTER — Ambulatory Visit: Payer: Self-pay | Admitting: Oncology

## 2009-09-13 LAB — CBC WITH DIFFERENTIAL/PLATELET
BASO%: 0.5 % (ref 0.0–2.0)
HCT: 33.9 % — ABNORMAL LOW (ref 38.4–49.9)
HGB: 11.7 g/dL — ABNORMAL LOW (ref 13.0–17.1)
MCHC: 34.5 g/dL (ref 32.0–36.0)
MONO#: 0.4 10*3/uL (ref 0.1–0.9)
NEUT#: 2.8 10*3/uL (ref 1.5–6.5)
NEUT%: 55.6 % (ref 39.0–75.0)
WBC: 5.1 10*3/uL (ref 4.0–10.3)
lymph#: 1.6 10*3/uL (ref 0.9–3.3)

## 2009-09-13 LAB — COMPREHENSIVE METABOLIC PANEL
AST: 18 U/L (ref 0–37)
Albumin: 4.6 g/dL (ref 3.5–5.2)
Alkaline Phosphatase: 68 U/L (ref 39–117)
BUN: 16 mg/dL (ref 6–23)
Creatinine, Ser: 1.13 mg/dL (ref 0.40–1.50)
Potassium: 3.9 mEq/L (ref 3.5–5.3)

## 2009-09-13 LAB — MORPHOLOGY

## 2009-09-19 LAB — BCR/ABL

## 2009-10-27 ENCOUNTER — Ambulatory Visit: Payer: Self-pay | Admitting: Oncology

## 2009-10-27 LAB — COMPREHENSIVE METABOLIC PANEL
BUN: 16 mg/dL (ref 6–23)
CO2: 29 mEq/L (ref 19–32)
Creatinine, Ser: 1.18 mg/dL (ref 0.40–1.50)
Glucose, Bld: 105 mg/dL — ABNORMAL HIGH (ref 70–99)
Total Bilirubin: 0.6 mg/dL (ref 0.3–1.2)
Total Protein: 6.4 g/dL (ref 6.0–8.3)

## 2009-10-27 LAB — CBC WITH DIFFERENTIAL/PLATELET
BASO%: 1.1 % (ref 0.0–2.0)
EOS%: 5.9 % (ref 0.0–7.0)
HCT: 32 % — ABNORMAL LOW (ref 38.4–49.9)
LYMPH%: 31.1 % (ref 14.0–49.0)
MCH: 32 pg (ref 27.2–33.4)
MCHC: 34.9 g/dL (ref 32.0–36.0)
MONO%: 9.3 % (ref 0.0–14.0)
NEUT%: 52.6 % (ref 39.0–75.0)
Platelets: 253 10*3/uL (ref 140–400)
RBC: 3.5 10*6/uL — ABNORMAL LOW (ref 4.20–5.82)

## 2009-10-27 LAB — LACTATE DEHYDROGENASE: LDH: 165 U/L (ref 94–250)

## 2010-01-06 ENCOUNTER — Ambulatory Visit: Payer: Self-pay | Admitting: Oncology

## 2010-01-10 LAB — CBC WITH DIFFERENTIAL/PLATELET
BASO%: 0.9 % (ref 0.0–2.0)
Basophils Absolute: 0 10*3/uL (ref 0.0–0.1)
EOS%: 4.9 % (ref 0.0–7.0)
MCH: 31.5 pg (ref 27.2–33.4)
MCHC: 34.5 g/dL (ref 32.0–36.0)
MCV: 91.5 fL (ref 79.3–98.0)
MONO%: 9 % (ref 0.0–14.0)
RBC: 3.48 10*6/uL — ABNORMAL LOW (ref 4.20–5.82)
RDW: 12.7 % (ref 11.0–14.6)
lymph#: 1.5 10*3/uL (ref 0.9–3.3)

## 2010-01-10 LAB — COMPREHENSIVE METABOLIC PANEL
AST: 19 U/L (ref 0–37)
Albumin: 4.4 g/dL (ref 3.5–5.2)
Alkaline Phosphatase: 50 U/L (ref 39–117)
Calcium: 9.1 mg/dL (ref 8.4–10.5)
Chloride: 105 mEq/L (ref 96–112)
Glucose, Bld: 103 mg/dL — ABNORMAL HIGH (ref 70–99)
Potassium: 3.9 mEq/L (ref 3.5–5.3)
Sodium: 138 mEq/L (ref 135–145)
Total Protein: 6.5 g/dL (ref 6.0–8.3)

## 2010-01-10 LAB — MORPHOLOGY

## 2010-03-10 ENCOUNTER — Ambulatory Visit: Payer: Self-pay | Admitting: Oncology

## 2010-03-14 LAB — CBC WITH DIFFERENTIAL/PLATELET
BASO%: 0.7 % (ref 0.0–2.0)
EOS%: 5.1 % (ref 0.0–7.0)
MCH: 32.2 pg (ref 27.2–33.4)
MCHC: 34.8 g/dL (ref 32.0–36.0)
MCV: 92.6 fL (ref 79.3–98.0)
MONO%: 9.3 % (ref 0.0–14.0)
NEUT%: 48.1 % (ref 39.0–75.0)
RDW: 12.6 % (ref 11.0–14.6)
lymph#: 1.9 10*3/uL (ref 0.9–3.3)

## 2010-03-14 LAB — LACTATE DEHYDROGENASE: LDH: 162 U/L (ref 94–250)

## 2010-03-14 LAB — MORPHOLOGY
PLT EST: ADEQUATE
White Cell Comments: NONE SEEN

## 2010-03-14 LAB — COMPREHENSIVE METABOLIC PANEL
ALT: 18 U/L (ref 0–53)
BUN: 20 mg/dL (ref 6–23)
CO2: 26 mEq/L (ref 19–32)
Creatinine, Ser: 1.26 mg/dL (ref 0.40–1.50)
Total Bilirubin: 0.4 mg/dL (ref 0.3–1.2)

## 2010-05-25 ENCOUNTER — Ambulatory Visit: Payer: Self-pay | Admitting: Oncology

## 2010-05-30 LAB — LACTATE DEHYDROGENASE: LDH: 166 U/L (ref 94–250)

## 2010-05-30 LAB — CBC WITH DIFFERENTIAL/PLATELET
BASO%: 0.5 % (ref 0.0–2.0)
Basophils Absolute: 0 10*3/uL (ref 0.0–0.1)
EOS%: 6.5 % (ref 0.0–7.0)
HGB: 11.4 g/dL — ABNORMAL LOW (ref 13.0–17.1)
MCH: 31.6 pg (ref 27.2–33.4)
NEUT#: 2.3 10*3/uL (ref 1.5–6.5)
RDW: 12.6 % (ref 11.0–14.6)
WBC: 4.6 10*3/uL (ref 4.0–10.3)
lymph#: 1.6 10*3/uL (ref 0.9–3.3)

## 2010-05-30 LAB — MORPHOLOGY: RBC Comments: NORMAL

## 2010-05-30 LAB — COMPREHENSIVE METABOLIC PANEL
ALT: 19 U/L (ref 0–53)
Albumin: 4.4 g/dL (ref 3.5–5.2)
CO2: 27 mEq/L (ref 19–32)
Calcium: 9.3 mg/dL (ref 8.4–10.5)
Chloride: 104 mEq/L (ref 96–112)
Glucose, Bld: 95 mg/dL (ref 70–99)
Sodium: 140 mEq/L (ref 135–145)
Total Bilirubin: 0.5 mg/dL (ref 0.3–1.2)
Total Protein: 6.6 g/dL (ref 6.0–8.3)

## 2010-06-07 LAB — BCR/ABL

## 2010-07-25 ENCOUNTER — Other Ambulatory Visit: Payer: Self-pay | Admitting: Oncology

## 2010-07-25 ENCOUNTER — Encounter (HOSPITAL_BASED_OUTPATIENT_CLINIC_OR_DEPARTMENT_OTHER): Payer: PRIVATE HEALTH INSURANCE | Admitting: Oncology

## 2010-07-25 DIAGNOSIS — C9210 Chronic myeloid leukemia, BCR/ABL-positive, not having achieved remission: Secondary | ICD-10-CM

## 2010-07-25 DIAGNOSIS — C9211 Chronic myeloid leukemia, BCR/ABL-positive, in remission: Secondary | ICD-10-CM

## 2010-07-25 LAB — COMPREHENSIVE METABOLIC PANEL
ALT: 14 U/L (ref 0–53)
Albumin: 4.4 g/dL (ref 3.5–5.2)
CO2: 29 mEq/L (ref 19–32)
Calcium: 9.2 mg/dL (ref 8.4–10.5)
Chloride: 103 mEq/L (ref 96–112)
Glucose, Bld: 96 mg/dL (ref 70–99)
Sodium: 140 mEq/L (ref 135–145)
Total Bilirubin: 0.5 mg/dL (ref 0.3–1.2)
Total Protein: 6.6 g/dL (ref 6.0–8.3)

## 2010-07-25 LAB — CBC WITH DIFFERENTIAL/PLATELET
Basophils Absolute: 0 10*3/uL (ref 0.0–0.1)
EOS%: 4.5 % (ref 0.0–7.0)
Eosinophils Absolute: 0.2 10*3/uL (ref 0.0–0.5)
HCT: 33.6 % — ABNORMAL LOW (ref 38.4–49.9)
HGB: 11.3 g/dL — ABNORMAL LOW (ref 13.0–17.1)
MCH: 30.4 pg (ref 27.2–33.4)
MONO#: 0.4 10*3/uL (ref 0.1–0.9)
NEUT#: 2.7 10*3/uL (ref 1.5–6.5)
NEUT%: 58.6 % (ref 39.0–75.0)
RDW: 12.8 % (ref 11.0–14.6)
lymph#: 1.3 10*3/uL (ref 0.9–3.3)

## 2010-07-25 LAB — MORPHOLOGY: PLT EST: ADEQUATE

## 2010-07-25 LAB — LACTATE DEHYDROGENASE: LDH: 172 U/L (ref 94–250)

## 2010-09-13 ENCOUNTER — Other Ambulatory Visit: Payer: Self-pay | Admitting: Oncology

## 2010-09-13 ENCOUNTER — Encounter (HOSPITAL_BASED_OUTPATIENT_CLINIC_OR_DEPARTMENT_OTHER): Payer: PRIVATE HEALTH INSURANCE | Admitting: Oncology

## 2010-09-13 DIAGNOSIS — C9211 Chronic myeloid leukemia, BCR/ABL-positive, in remission: Secondary | ICD-10-CM

## 2010-09-13 DIAGNOSIS — C9210 Chronic myeloid leukemia, BCR/ABL-positive, not having achieved remission: Secondary | ICD-10-CM

## 2010-09-13 LAB — CBC WITH DIFFERENTIAL/PLATELET
BASO%: 0.7 % (ref 0.0–2.0)
EOS%: 7.5 % — ABNORMAL HIGH (ref 0.0–7.0)
HCT: 32 % — ABNORMAL LOW (ref 38.4–49.9)
MCH: 31.6 pg (ref 27.2–33.4)
MCHC: 33.9 g/dL (ref 32.0–36.0)
MONO#: 0.4 10*3/uL (ref 0.1–0.9)
NEUT%: 50.6 % (ref 39.0–75.0)
RBC: 3.43 10*6/uL — ABNORMAL LOW (ref 4.20–5.82)
RDW: 12.9 % (ref 11.0–14.6)
WBC: 4.4 10*3/uL (ref 4.0–10.3)
lymph#: 1.4 10*3/uL (ref 0.9–3.3)

## 2010-09-13 LAB — COMPREHENSIVE METABOLIC PANEL
ALT: 12 U/L (ref 0–53)
AST: 20 U/L (ref 0–37)
Alkaline Phosphatase: 53 U/L (ref 39–117)
CO2: 25 mEq/L (ref 19–32)
Sodium: 138 mEq/L (ref 135–145)
Total Bilirubin: 0.6 mg/dL (ref 0.3–1.2)
Total Protein: 6.4 g/dL (ref 6.0–8.3)

## 2010-09-13 LAB — MORPHOLOGY
PLT EST: ADEQUATE
RBC Comments: NORMAL

## 2010-09-26 ENCOUNTER — Encounter (HOSPITAL_BASED_OUTPATIENT_CLINIC_OR_DEPARTMENT_OTHER): Payer: PRIVATE HEALTH INSURANCE | Admitting: Oncology

## 2010-09-26 DIAGNOSIS — C9211 Chronic myeloid leukemia, BCR/ABL-positive, in remission: Secondary | ICD-10-CM

## 2010-09-26 DIAGNOSIS — F411 Generalized anxiety disorder: Secondary | ICD-10-CM

## 2010-11-21 ENCOUNTER — Other Ambulatory Visit: Payer: Self-pay | Admitting: Oncology

## 2010-11-21 ENCOUNTER — Encounter (HOSPITAL_BASED_OUTPATIENT_CLINIC_OR_DEPARTMENT_OTHER): Payer: PRIVATE HEALTH INSURANCE | Admitting: Oncology

## 2010-11-21 DIAGNOSIS — C9210 Chronic myeloid leukemia, BCR/ABL-positive, not having achieved remission: Secondary | ICD-10-CM

## 2010-11-21 DIAGNOSIS — C9211 Chronic myeloid leukemia, BCR/ABL-positive, in remission: Secondary | ICD-10-CM

## 2010-11-21 LAB — MORPHOLOGY

## 2010-11-21 LAB — CBC WITH DIFFERENTIAL/PLATELET
Basophils Absolute: 0 10*3/uL (ref 0.0–0.1)
EOS%: 8.1 % — ABNORMAL HIGH (ref 0.0–7.0)
HCT: 31.7 % — ABNORMAL LOW (ref 38.4–49.9)
HGB: 10.9 g/dL — ABNORMAL LOW (ref 13.0–17.1)
MCH: 31.8 pg (ref 27.2–33.4)
MCV: 92.7 fL (ref 79.3–98.0)
MONO%: 8.7 % (ref 0.0–14.0)
NEUT%: 57.8 % (ref 39.0–75.0)
Platelets: 174 10*3/uL (ref 140–400)

## 2010-11-21 LAB — COMPREHENSIVE METABOLIC PANEL
Albumin: 4.3 g/dL (ref 3.5–5.2)
BUN: 18 mg/dL (ref 6–23)
CO2: 27 mEq/L (ref 19–32)
Glucose, Bld: 95 mg/dL (ref 70–99)
Sodium: 139 mEq/L (ref 135–145)
Total Bilirubin: 0.5 mg/dL (ref 0.3–1.2)
Total Protein: 6.4 g/dL (ref 6.0–8.3)

## 2010-11-21 LAB — LACTATE DEHYDROGENASE: LDH: 163 U/L (ref 94–250)

## 2011-01-23 ENCOUNTER — Other Ambulatory Visit: Payer: Self-pay | Admitting: Oncology

## 2011-01-23 ENCOUNTER — Encounter (HOSPITAL_BASED_OUTPATIENT_CLINIC_OR_DEPARTMENT_OTHER): Payer: PRIVATE HEALTH INSURANCE | Admitting: Oncology

## 2011-01-23 DIAGNOSIS — C9210 Chronic myeloid leukemia, BCR/ABL-positive, not having achieved remission: Secondary | ICD-10-CM

## 2011-01-23 DIAGNOSIS — C9211 Chronic myeloid leukemia, BCR/ABL-positive, in remission: Secondary | ICD-10-CM

## 2011-01-23 LAB — COMPREHENSIVE METABOLIC PANEL
ALT: 15 U/L (ref 0–53)
AST: 21 U/L (ref 0–37)
CO2: 28 mEq/L (ref 19–32)
Calcium: 8.8 mg/dL (ref 8.4–10.5)
Chloride: 104 mEq/L (ref 96–112)
Potassium: 4.1 mEq/L (ref 3.5–5.3)
Sodium: 141 mEq/L (ref 135–145)
Total Protein: 6.1 g/dL (ref 6.0–8.3)

## 2011-01-23 LAB — LACTATE DEHYDROGENASE: LDH: 165 U/L (ref 94–250)

## 2011-01-23 LAB — CBC WITH DIFFERENTIAL/PLATELET
Basophils Absolute: 0 10*3/uL (ref 0.0–0.1)
EOS%: 8.1 % — ABNORMAL HIGH (ref 0.0–7.0)
HGB: 11.1 g/dL — ABNORMAL LOW (ref 13.0–17.1)
LYMPH%: 32.4 % (ref 14.0–49.0)
MCH: 31.6 pg (ref 27.2–33.4)
MCV: 92.9 fL (ref 79.3–98.0)
MONO%: 9.1 % (ref 0.0–14.0)
NEUT%: 49.8 % (ref 39.0–75.0)
Platelets: 165 10*3/uL (ref 140–400)
RDW: 12.4 % (ref 11.0–14.6)

## 2011-01-23 LAB — MORPHOLOGY: PLT EST: ADEQUATE

## 2011-03-17 ENCOUNTER — Telehealth: Payer: Self-pay | Admitting: Oncology

## 2011-03-17 NOTE — Telephone Encounter (Signed)
Talked to pt gave him appt date for January, pt aware of appt on 04/02/11

## 2011-04-02 ENCOUNTER — Other Ambulatory Visit: Payer: Self-pay | Admitting: Oncology

## 2011-04-02 ENCOUNTER — Other Ambulatory Visit (HOSPITAL_BASED_OUTPATIENT_CLINIC_OR_DEPARTMENT_OTHER): Payer: PRIVATE HEALTH INSURANCE

## 2011-04-02 DIAGNOSIS — C9211 Chronic myeloid leukemia, BCR/ABL-positive, in remission: Secondary | ICD-10-CM

## 2011-04-02 LAB — MORPHOLOGY: PLT EST: ADEQUATE

## 2011-04-02 LAB — CBC WITH DIFFERENTIAL/PLATELET
Basophils Absolute: 0 10*3/uL (ref 0.0–0.1)
Eosinophils Absolute: 0.3 10*3/uL (ref 0.0–0.5)
HGB: 11.2 g/dL — ABNORMAL LOW (ref 13.0–17.1)
MCV: 92.6 fL (ref 79.3–98.0)
MONO#: 0.5 10*3/uL (ref 0.1–0.9)
NEUT#: 2 10*3/uL (ref 1.5–6.5)
RDW: 12.9 % (ref 11.0–14.6)
lymph#: 1.5 10*3/uL (ref 0.9–3.3)

## 2011-04-02 LAB — COMPREHENSIVE METABOLIC PANEL
ALT: 19 U/L (ref 0–53)
Alkaline Phosphatase: 63 U/L (ref 39–117)
Sodium: 138 mEq/L (ref 135–145)
Total Bilirubin: 0.4 mg/dL (ref 0.3–1.2)
Total Protein: 6.4 g/dL (ref 6.0–8.3)

## 2011-04-13 NOTE — Progress Notes (Signed)
BCR/ABL report mailed to pt's home address per Dr Cyndie Chime. dph

## 2011-05-03 ENCOUNTER — Other Ambulatory Visit: Payer: Self-pay

## 2011-05-03 DIAGNOSIS — C9210 Chronic myeloid leukemia, BCR/ABL-positive, not having achieved remission: Secondary | ICD-10-CM

## 2011-05-03 MED ORDER — IMATINIB MESYLATE 400 MG PO TABS: 400.0000 mg | ORAL_TABLET | Freq: Every day | ORAL | Status: AC

## 2011-05-29 ENCOUNTER — Encounter: Payer: Self-pay | Admitting: Oncology

## 2011-05-29 ENCOUNTER — Ambulatory Visit (HOSPITAL_BASED_OUTPATIENT_CLINIC_OR_DEPARTMENT_OTHER): Payer: PRIVATE HEALTH INSURANCE | Admitting: Oncology

## 2011-05-29 ENCOUNTER — Ambulatory Visit (HOSPITAL_COMMUNITY)
Admission: RE | Admit: 2011-05-29 | Discharge: 2011-05-29 | Disposition: A | Discharge status: Home / Self Care | Payer: PRIVATE HEALTH INSURANCE | Source: Ambulatory Visit | Attending: Oncology | Admitting: Oncology

## 2011-05-29 ENCOUNTER — Telehealth: Payer: Self-pay | Admitting: Oncology

## 2011-05-29 VITALS — BP 150/85 | HR 99 | Temp 98.5°F | Ht 69.0 in | Wt 173.2 lb

## 2011-05-29 DIAGNOSIS — M7989 Other specified soft tissue disorders: Secondary | ICD-10-CM

## 2011-05-29 DIAGNOSIS — C9211 Chronic myeloid leukemia, BCR/ABL-positive, in remission: Secondary | ICD-10-CM

## 2011-05-29 NOTE — Progress Notes (Signed)
PRELIMINARY  PRELIMINARY PRELIMINARY  Right lower extremity venous duplex performed.  Right leg is negative for deep and superficial vein thrombosis.  Florestine Avers, RVT Chief Vascular Sonographer

## 2011-05-29 NOTE — Progress Notes (Signed)
Hematology and Oncology Follow Up Visit  Amauris Debois [id number] Mar 15, 1961 51 y.o. 05/29/2011 6:21 PM   Principle Diagnosis: Encounter Diagnoses  Name Primary?  . CML in remission   . 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 He was diagnosed in December 2004 when he began to develop progressive left upper quadrant discomfort. His father who is a retired Acupuncturist examined him and found that he had a massively enlarged spleen. Laboratory studies were done and showed marked leukocytosis with a white blood count of 340,000, initial hemoglobin 10, platelets 222,000. Physical findings confirm massive splenomegaly. He also had retinal hemorrhages. He was started on Gleevec which had been recently FDA approved and had a rapid and dramatic response. Dose was escalated up to 800 mg. He achieved a complete molecular response within a short time with BCR-ABL transcript falling to below 3 logs from baseline. Over time, we have slowly decreased his Gleevec dose first to 600 mg daily and then last month in December 2012 to 400 mg daily. Most recent molecular markers still showed complete molecular response done on 04/02/2011. Overall he is doing well. He is still getting some intermittent chest discomfort associated with anxiety. He bumped the anterior tibial region of his leg coming out of the shower when he slipped on the floor matt. It is sore if he palpates over the tibia but it doesn't hurt to walk and he has no calf tenderness.  Medications: reviewed  Allergies: No Known Allergies  Review of Systems: Constitutional:  No constitutional symptoms  Respiratory: No dyspnea or cough Cardiovascular:  See above  Gastrointestinal no change in bowel habit Genito-Urinary: No urinary tract symptoms Musculoskeletal: See above Neurologic: Not questioned Skin: No rash or ecchymosis Remaining ROS  negative.  Physical Exam: Blood pressure 150/85, pulse 99, temperature 98.5 F (36.9 C), temperature source Oral, height 5\' 9"  (1.753 m), weight 173 lb 3.2 oz (78.563 kg). Wt Readings from Last 3 Encounters:  05/29/11 173 lb 3.2 oz (78.563 kg)     General appearance: Pale Caucasian man HENNT: Normal male pattern baldness Lymph nodes: No adenopathy Breasts: Lungs: Clear to auscultation resonant to percussion Heart: Regular cardiac rhythm no murmur Abdomen: soft nontender no mass no organomegaly Extremities: Asymmetric edema right calf 2 cm greater than left 36 cm compared with 34 ankle symmetric 22 cm bilaterally right calf is firm but not tender no cord Vascular: No cyanosis Neurologic: Motor strength 5 over 5 reflexes 1+ symmetric Skin: No rash or ecchymosis  Lab Results: Lab Results  Component Value Date   WBC 4.2 04/02/2011   HGB 11.2* 04/02/2011   HCT 33.0* 04/02/2011   MCV 92.6 04/02/2011   PLT 184 04/02/2011     Chemistry      Component Value Date/Time   NA 138 04/02/2011 1301   K 3.9 04/02/2011 1301   CL 102 04/02/2011 1301   CO2 28 04/02/2011 1301   BUN 19 04/02/2011 1301   CREATININE 1.08 04/02/2011 1301      Component Value Date/Time   CALCIUM 9.0 04/02/2011 1301   ALKPHOS 63 04/02/2011 1301   AST 26 04/02/2011 1301   ALT 19 04/02/2011 1301   BILITOT 0.4 04/02/2011 1301       Radiological Studies: Ultrasound right lower extremity done this evening shows no clot   Impression and Plan: #1. 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 Plan: Continue Gleevec at recently reduced dose 400 mg daily. Continue lab every 2 months and molecular studies every 4 months. #2. Minor trauma right shin with physical findings of calf swelling without tenderness. Ultrasound negative as noted above for any blood clots. #3. Anxiety related chest discomfort stable no progressive symptoms.   CC:.    Levert Feinstein,  MD 1/29/20136:21 PM

## 2011-05-29 NOTE — Telephone Encounter (Signed)
Talked to pt, gave him lab appt on 2/5 then 4/2 and see MD on 10/05/11, MD is on vacation on 10/02/11

## 2011-06-05 ENCOUNTER — Other Ambulatory Visit (HOSPITAL_BASED_OUTPATIENT_CLINIC_OR_DEPARTMENT_OTHER): Payer: PRIVATE HEALTH INSURANCE | Admitting: Lab

## 2011-06-05 DIAGNOSIS — C9211 Chronic myeloid leukemia, BCR/ABL-positive, in remission: Secondary | ICD-10-CM

## 2011-06-05 LAB — CBC WITH DIFFERENTIAL/PLATELET
BASO%: 0.7 % (ref 0.0–2.0)
HCT: 35.3 % — ABNORMAL LOW (ref 38.4–49.9)
HGB: 11.9 g/dL — ABNORMAL LOW (ref 13.0–17.1)
MCHC: 33.6 g/dL (ref 32.0–36.0)
MONO#: 0.7 10*3/uL (ref 0.1–0.9)
NEUT#: 2.6 10*3/uL (ref 1.5–6.5)
NEUT%: 49.2 % (ref 39.0–75.0)
WBC: 5.2 10*3/uL (ref 4.0–10.3)
lymph#: 1.6 10*3/uL (ref 0.9–3.3)

## 2011-06-05 LAB — COMPREHENSIVE METABOLIC PANEL
ALT: 20 U/L (ref 0–53)
CO2: 28 mEq/L (ref 19–32)
Calcium: 9.5 mg/dL (ref 8.4–10.5)
Chloride: 101 mEq/L (ref 96–112)
Creatinine, Ser: 1.28 mg/dL (ref 0.50–1.35)
Total Protein: 6.6 g/dL (ref 6.0–8.3)

## 2011-06-05 LAB — LACTATE DEHYDROGENASE: LDH: 154 U/L (ref 94–250)

## 2011-07-31 ENCOUNTER — Other Ambulatory Visit (HOSPITAL_BASED_OUTPATIENT_CLINIC_OR_DEPARTMENT_OTHER): Payer: PRIVATE HEALTH INSURANCE | Admitting: Lab

## 2011-07-31 DIAGNOSIS — C9211 Chronic myeloid leukemia, BCR/ABL-positive, in remission: Secondary | ICD-10-CM

## 2011-07-31 LAB — CBC WITH DIFFERENTIAL/PLATELET
BASO%: 1.1 % (ref 0.0–2.0)
EOS%: 7.2 % — ABNORMAL HIGH (ref 0.0–7.0)
HGB: 12.1 g/dL — ABNORMAL LOW (ref 13.0–17.1)
MCH: 30.5 pg (ref 27.2–33.4)
MCHC: 33.4 g/dL (ref 32.0–36.0)
RBC: 3.97 10*6/uL — ABNORMAL LOW (ref 4.20–5.82)
RDW: 12.1 % (ref 11.0–14.6)
lymph#: 1.5 10*3/uL (ref 0.9–3.3)
nRBC: 0 % (ref 0–0)

## 2011-07-31 LAB — COMPREHENSIVE METABOLIC PANEL
AST: 23 U/L (ref 0–37)
Alkaline Phosphatase: 67 U/L (ref 39–117)
BUN: 16 mg/dL (ref 6–23)
Creatinine, Ser: 1.3 mg/dL (ref 0.50–1.35)

## 2011-08-10 ENCOUNTER — Telehealth: Payer: Self-pay | Admitting: *Deleted

## 2011-08-10 NOTE — Telephone Encounter (Signed)
Copy of BCR-ABL result mailed to pt per Dr.Granfortuna's request.

## 2011-10-02 ENCOUNTER — Other Ambulatory Visit: Payer: PRIVATE HEALTH INSURANCE

## 2011-10-05 ENCOUNTER — Ambulatory Visit (HOSPITAL_BASED_OUTPATIENT_CLINIC_OR_DEPARTMENT_OTHER): Payer: PRIVATE HEALTH INSURANCE | Admitting: Oncology

## 2011-10-05 ENCOUNTER — Telehealth: Payer: Self-pay | Admitting: Oncology

## 2011-10-05 ENCOUNTER — Other Ambulatory Visit (HOSPITAL_BASED_OUTPATIENT_CLINIC_OR_DEPARTMENT_OTHER): Payer: PRIVATE HEALTH INSURANCE | Admitting: Lab

## 2011-10-05 VITALS — BP 148/81 | HR 102 | Temp 98.5°F | Ht 69.0 in | Wt 173.6 lb

## 2011-10-05 DIAGNOSIS — C9211 Chronic myeloid leukemia, BCR/ABL-positive, in remission: Secondary | ICD-10-CM

## 2011-10-05 LAB — COMPREHENSIVE METABOLIC PANEL
CO2: 30 mEq/L (ref 19–32)
Calcium: 9.2 mg/dL (ref 8.4–10.5)
Chloride: 105 mEq/L (ref 96–112)
Glucose, Bld: 94 mg/dL (ref 70–99)
Sodium: 140 mEq/L (ref 135–145)
Total Bilirubin: 0.4 mg/dL (ref 0.3–1.2)
Total Protein: 6.2 g/dL (ref 6.0–8.3)

## 2011-10-05 LAB — CBC WITH DIFFERENTIAL/PLATELET
Eosinophils Absolute: 0.3 10*3/uL (ref 0.0–0.5)
HCT: 35.8 % — ABNORMAL LOW (ref 38.4–49.9)
LYMPH%: 31.1 % (ref 14.0–49.0)
MONO#: 0.6 10*3/uL (ref 0.1–0.9)
NEUT#: 2.8 10*3/uL (ref 1.5–6.5)
NEUT%: 51.6 % (ref 39.0–75.0)
Platelets: 191 10*3/uL (ref 140–400)
RBC: 3.98 10*6/uL — ABNORMAL LOW (ref 4.20–5.82)
WBC: 5.4 10*3/uL (ref 4.0–10.3)

## 2011-10-05 LAB — LACTATE DEHYDROGENASE: LDH: 132 U/L (ref 94–250)

## 2011-10-05 NOTE — Progress Notes (Signed)
Hematology and Oncology Follow Up Visit  Patrick Nash [id number] Sep 17, 1960 51 y.o. 10/05/2011 11:46 AM   Principle Diagnosis: Encounter Diagnosis  Name Primary?  . 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 He was diagnosed in December 2004 when he began to develop progressive left upper quadrant discomfort. His father who is a retired Acupuncturist examined him and found that he had a massively enlarged spleen. Laboratory studies were done and showed marked leukocytosis with a white blood count of 340,000, initial hemoglobin 10, platelets 222,000. Physical findings confirm massive splenomegaly. He also had retinal hemorrhages. He was started on Gleevec which had been recently FDA approved and had a rapid and dramatic response. Dose was escalated up to 800 mg. He achieved a complete molecular response within a short time with BCR-ABL transcript falling to below 3 logs from baseline. Over time, we have slowly decreased his Gleevec dose first to 600 mg daily and then last month in December 2012 to 400 mg daily. Most recent molecular markers still showed complete molecular response done on 07/31/11 with BCR-ABL transcript level below for logs from baseline. He has no new complaints today. He get some intermittent vertigo when he gets up from bed. He notes that his bed is on the floor. Symptoms have not been persistent or progressive.   Medications: reviewed  Allergies: No Known Allergies  Review of Systems: Constitutional:  No constitutional symptoms  Respiratory: No cough or dyspnea Cardiovascular:  No chest pain or palpitations Gastrointestinal: No change in bowel habit Genito-Urinary: Not questioned Musculoskeletal: No muscle or bone pain Neurologic: No headache or change in vision Skin: No rash Remaining ROS negative.  Physical Exam: Blood pressure 148/81, pulse 102, temperature 98.5  F (36.9 C), temperature source Oral, height 5\' 9"  (1.753 m), weight 173 lb 9.6 oz (78.744 kg). Wt Readings from Last 3 Encounters:  10/05/11 173 lb 9.6 oz (78.744 kg)  05/29/11 173 lb 3.2 oz (78.563 kg)     General appearance: Pale Caucasian man HENNT: Pharynx no erythema or exudate Lymph nodes: No adenopathy Breasts: Lungs: Clear to auscultation resonant to percussion Heart: Regular rhythm no murmur Abdomen: Soft nontender no mass no organomegaly Extremities: No edema no calf tenderness Vascular: No cyanosis Neurologic: Motor strength 5 over 5, reflexes 3+ symmetric Skin: No rash or ecchymosis  Lab Results: Lab Results  Component Value Date   WBC 5.4 10/05/2011   HGB 12.2* 10/05/2011   HCT 35.8* 10/05/2011   MCV 89.8 10/05/2011   PLT 191 10/05/2011     Chemistry      Component Value Date/Time   NA 140 07/31/2011 1029   K 4.1 07/31/2011 1029   CL 105 07/31/2011 1029   CO2 28 07/31/2011 1029   BUN 16 07/31/2011 1029   CREATININE 1.30 07/31/2011 1029      Component Value Date/Time   CALCIUM 9.2 07/31/2011 1029   ALKPHOS 67 07/31/2011 1029   AST 23 07/31/2011 1029   ALT 17 07/31/2011 1029   BILITOT 0.4 07/31/2011 1029       Impression and Plan: #1. CML He remains in a molecular remission on Gleevec now out 8-1/2 years from initial diagnosis in December 2004. Plan: Continue Gleevec. Molecular monitoring every 4 months. Routine lab analysis every 2 months.   CC:.    Levert Feinstein, MD 6/7/201311:46 AM

## 2011-10-05 NOTE — Telephone Encounter (Signed)
Gave pt appt for August  Lab , October lab and see Md with lab in December 2013

## 2011-11-30 ENCOUNTER — Other Ambulatory Visit (HOSPITAL_BASED_OUTPATIENT_CLINIC_OR_DEPARTMENT_OTHER): Payer: PRIVATE HEALTH INSURANCE | Admitting: Lab

## 2011-11-30 DIAGNOSIS — C9210 Chronic myeloid leukemia, BCR/ABL-positive, not having achieved remission: Secondary | ICD-10-CM

## 2011-11-30 DIAGNOSIS — C9211 Chronic myeloid leukemia, BCR/ABL-positive, in remission: Secondary | ICD-10-CM

## 2011-11-30 LAB — CBC WITH DIFFERENTIAL/PLATELET
Basophils Absolute: 0.1 10*3/uL (ref 0.0–0.1)
Eosinophils Absolute: 0.3 10*3/uL (ref 0.0–0.5)
HGB: 12.3 g/dL — ABNORMAL LOW (ref 13.0–17.1)
LYMPH%: 33.1 % (ref 14.0–49.0)
MONO#: 0.5 10*3/uL (ref 0.1–0.9)
NEUT#: 2.4 10*3/uL (ref 1.5–6.5)
Platelets: 192 10*3/uL (ref 140–400)
RBC: 4.05 10*6/uL — ABNORMAL LOW (ref 4.20–5.82)
RDW: 12.3 % (ref 11.0–14.6)
WBC: 4.9 10*3/uL (ref 4.0–10.3)

## 2011-11-30 LAB — COMPREHENSIVE METABOLIC PANEL
ALT: 15 U/L (ref 0–53)
Alkaline Phosphatase: 61 U/L (ref 39–117)
Sodium: 140 mEq/L (ref 135–145)
Total Bilirubin: 0.4 mg/dL (ref 0.3–1.2)
Total Protein: 6.1 g/dL (ref 6.0–8.3)

## 2011-11-30 LAB — MORPHOLOGY: PLT EST: ADEQUATE

## 2011-12-04 ENCOUNTER — Other Ambulatory Visit: Payer: Self-pay | Admitting: Oncology

## 2011-12-04 DIAGNOSIS — C9211 Chronic myeloid leukemia, BCR/ABL-positive, in remission: Secondary | ICD-10-CM

## 2012-01-03 ENCOUNTER — Telehealth: Payer: Self-pay | Admitting: *Deleted

## 2012-01-03 NOTE — Telephone Encounter (Signed)
Pt. called asking for report of BCR-ABL to be mailed to him & also would like to have molecular testing in Dec done before MD visit.  Note to Dr Cyndie Chime & will have schedulers move lab appt. Will mail lab to pt after discussing with MD.

## 2012-01-03 NOTE — Telephone Encounter (Signed)
Talked to patient he is aware of labs on 03/28/12 lab only

## 2012-02-01 ENCOUNTER — Other Ambulatory Visit (HOSPITAL_BASED_OUTPATIENT_CLINIC_OR_DEPARTMENT_OTHER): Payer: PRIVATE HEALTH INSURANCE | Admitting: Lab

## 2012-02-01 DIAGNOSIS — C9211 Chronic myeloid leukemia, BCR/ABL-positive, in remission: Secondary | ICD-10-CM

## 2012-02-01 LAB — COMPREHENSIVE METABOLIC PANEL (CC13)
ALT: 15 U/L (ref 0–55)
AST: 20 U/L (ref 5–34)
Albumin: 3.8 g/dL (ref 3.5–5.0)
Calcium: 9.1 mg/dL (ref 8.4–10.4)
Chloride: 105 mEq/L (ref 98–107)
Potassium: 4 mEq/L (ref 3.5–5.1)
Total Protein: 6.5 g/dL (ref 6.4–8.3)

## 2012-02-01 LAB — CBC WITH DIFFERENTIAL/PLATELET
Eosinophils Absolute: 0.3 10*3/uL (ref 0.0–0.5)
HCT: 35.8 % — ABNORMAL LOW (ref 38.4–49.9)
LYMPH%: 33.9 % (ref 14.0–49.0)
MCHC: 33.8 g/dL (ref 32.0–36.0)
MCV: 88.4 fL (ref 79.3–98.0)
MONO#: 0.5 10*3/uL (ref 0.1–0.9)
NEUT%: 48.6 % (ref 39.0–75.0)
Platelets: 200 10*3/uL (ref 140–400)
WBC: 5.1 10*3/uL (ref 4.0–10.3)

## 2012-02-01 LAB — MORPHOLOGY: PLT EST: ADEQUATE

## 2012-03-28 ENCOUNTER — Other Ambulatory Visit (HOSPITAL_BASED_OUTPATIENT_CLINIC_OR_DEPARTMENT_OTHER): Payer: PRIVATE HEALTH INSURANCE | Admitting: Lab

## 2012-03-28 DIAGNOSIS — C9211 Chronic myeloid leukemia, BCR/ABL-positive, in remission: Secondary | ICD-10-CM

## 2012-03-28 LAB — COMPREHENSIVE METABOLIC PANEL (CC13)
ALT: 21 U/L (ref 0–55)
AST: 23 U/L (ref 5–34)
Alkaline Phosphatase: 89 U/L (ref 40–150)
BUN: 15 mg/dL (ref 7.0–26.0)
Chloride: 104 mEq/L (ref 98–107)
Creatinine: 1.2 mg/dL (ref 0.7–1.3)
Total Bilirubin: 0.46 mg/dL (ref 0.20–1.20)

## 2012-03-28 LAB — CBC WITH DIFFERENTIAL/PLATELET
Basophils Absolute: 0 10*3/uL (ref 0.0–0.1)
EOS%: 7.1 % — ABNORMAL HIGH (ref 0.0–7.0)
MCH: 29.6 pg (ref 27.2–33.4)
MCV: 90.2 fL (ref 79.3–98.0)
MONO%: 12.2 % (ref 0.0–14.0)
RBC: 4.19 10*6/uL — ABNORMAL LOW (ref 4.20–5.82)
RDW: 12.9 % (ref 11.0–14.6)

## 2012-03-28 LAB — MORPHOLOGY

## 2012-03-28 LAB — LACTATE DEHYDROGENASE (CC13): LDH: 176 U/L (ref 125–245)

## 2012-04-10 ENCOUNTER — Telehealth: Payer: Self-pay | Admitting: *Deleted

## 2012-04-10 NOTE — Telephone Encounter (Signed)
Copy of BCR/ABL report from Nov. mailed to pt per Dr. Cyndie Chime.

## 2012-04-11 ENCOUNTER — Telehealth: Payer: Self-pay | Admitting: Oncology

## 2012-04-11 ENCOUNTER — Other Ambulatory Visit: Payer: PRIVATE HEALTH INSURANCE

## 2012-04-11 ENCOUNTER — Ambulatory Visit (HOSPITAL_BASED_OUTPATIENT_CLINIC_OR_DEPARTMENT_OTHER): Payer: PRIVATE HEALTH INSURANCE | Admitting: Oncology

## 2012-04-11 VITALS — BP 146/76 | HR 90 | Temp 97.9°F | Resp 18 | Ht 69.0 in | Wt 173.2 lb

## 2012-04-11 DIAGNOSIS — C9211 Chronic myeloid leukemia, BCR/ABL-positive, in remission: Secondary | ICD-10-CM

## 2012-04-11 NOTE — Telephone Encounter (Signed)
gv and printed pt appt schedule for Jan, March, May and June 2014....the patient wanted May 26th lab order to be done May 16th

## 2012-04-11 NOTE — Progress Notes (Signed)
in Hematology and Oncology Follow Up Visit  Carter Kassel [id number] 1961-04-30 51 y.o. 04/11/2012 12:18 PM   Principle Diagnosis: Encounter Diagnosis  Name Primary?  . 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 He was one of the first patients I treated with Gleevec without using any other induction chemotherapy to lower his white count. He achieved a rapid molecular response. We were testing different dose levels at that time and his dose was escalated from the standard 400 mg daily dose to 800 mg. We have been able to taper his dose over time and he is currently down to just 400 mg a day and he is maintaining an excellent molecular response consistently less than 3 logs below baseline. Most recent testing done just 2 weeks ago.  He has had no major side effects on the drug. He has a mild, chronic, stable, anemia. He had some minimal fluid retention initially but this has resolved over time. Liver functions have remained stable.  He had a recent bronchitis which has resolved. No other interim medical problems.  Medications: reviewed  Allergies: No Known Allergies  Review of Systems: Constitutional:   No constitutional symptoms Respiratory: Resolved bronchitis Cardiovascular:  No chest pain or palpitations Gastrointestinal: No change in bowel but Genito-Urinary: Not questioned Musculoskeletal: No muscle or bone pain Neurologic: No recurrence of lumbar radiculopathy Skin: No rash Remaining ROS negative.  Physical Exam: Blood pressure 146/76, pulse 90, temperature 97.9 F (36.6 C), temperature source Oral, resp. rate 18, height 5\' 9"  (1.753 m), weight 173 lb 3.2 oz (78.563 kg). Wt Readings from Last 3 Encounters:   04/11/12 173 lb 3.2 oz (78.563 kg)  10/05/11 173 lb 9.6 oz (78.744 kg)  05/29/11 173 lb 3.2 oz (78.563 kg)     General appearance: Well-nourished Caucasian man HENNT: Male pattern baldness, pharynx no erythema or exudate Lymph nodes: No adenopathy Breasts: Lungs: Clear to auscultation resonant to percussion Heart: Regular rhythm no murmur Abdomen: Soft, nontender, no mass, no organomegaly Extremities: No edema, no Tenderness Vascular: No cyanosis Neurologic: Mental status intact, cranial nerves intact, motor strength 5 over 5, reflexes 1+ symmetric Skin: No rash or ecchymosis  Lab Results: Lab Results  Component Value Date   WBC 5.3 03/28/2012   HGB 12.4* 03/28/2012   HCT 37.8* 03/28/2012   MCV 90.2 03/28/2012   PLT 212 03/28/2012     Chemistry      Component Value Date/Time   NA 141 03/28/2012 1202   NA 140 11/30/2011 1112   K 4.5 03/28/2012 1202   K 4.2 11/30/2011 1112   CL 104 03/28/2012 1202   CL 104 11/30/2011 1112   CO2 29 03/28/2012 1202   CO2 29 11/30/2011 1112   BUN 15.0 03/28/2012 1202   BUN 18 11/30/2011 1112   CREATININE 1.2 03/28/2012 1202   CREATININE 1.27 11/30/2011 1112      Component Value Date/Time   CALCIUM 9.6 03/28/2012 1202   CALCIUM 9.3 11/30/2011 1112   ALKPHOS 89 03/28/2012 1202   ALKPHOS 61 11/30/2011 1112   AST 23 03/28/2012 1202   AST 19 11/30/2011 1112   ALT 21 03/28/2012 1202   ALT 15 11/30/2011 1112   BILITOT 0.46 03/28/2012 1202   BILITOT 0.4 11/30/2011 1112  Impression and Plan: Chronic myeloid leukemia. He remains in a hematologic and molecular remission 9 years out from initial diagnosis Plan: Continue every other month laboratory analysis. We will decrease frequency of molecular testing to every 6 months. I am back from recent [employer] of hemat[employer] meeting last week. It appears that about 30% of patients who stop Gleevec stay in remission long-term. We don't have any good predictors other than initial rapid complete cytogenetic  response is. I reviewed this information with Mr. Fedor. I think time will tell with this strategy can be applied to the average person on treatment. For now we will continue the Gleevec.   CC:.    Levert Feinstein, MD 12/13/201312:18 PM

## 2012-04-11 NOTE — Patient Instructions (Addendum)
Continue gleevec 400 mg daily Monthly lab  bcr-abl testing in 6 months

## 2012-04-14 LAB — BCR/ABL

## 2012-05-23 ENCOUNTER — Other Ambulatory Visit (HOSPITAL_BASED_OUTPATIENT_CLINIC_OR_DEPARTMENT_OTHER): Payer: BC Managed Care – PPO | Admitting: Lab

## 2012-05-23 DIAGNOSIS — C9211 Chronic myeloid leukemia, BCR/ABL-positive, in remission: Secondary | ICD-10-CM

## 2012-05-23 LAB — CBC WITH DIFFERENTIAL/PLATELET
BASO%: 0.9 % (ref 0.0–2.0)
Basophils Absolute: 0.1 10*3/uL (ref 0.0–0.1)
EOS%: 6.9 % (ref 0.0–7.0)
HCT: 37.8 % — ABNORMAL LOW (ref 38.4–49.9)
HGB: 12.9 g/dL — ABNORMAL LOW (ref 13.0–17.1)
LYMPH%: 29.4 % (ref 14.0–49.0)
MCH: 30.2 pg (ref 27.2–33.4)
MCHC: 34 g/dL (ref 32.0–36.0)
MCV: 89 fL (ref 79.3–98.0)
MONO%: 10 % (ref 0.0–14.0)
NEUT%: 52.8 % (ref 39.0–75.0)
Platelets: 212 10*3/uL (ref 140–400)
lymph#: 1.7 10*3/uL (ref 0.9–3.3)

## 2012-05-23 LAB — COMPREHENSIVE METABOLIC PANEL (CC13)
ALT: 20 U/L (ref 0–55)
Albumin: 3.9 g/dL (ref 3.5–5.0)
CO2: 29 mEq/L (ref 22–29)
Calcium: 9.6 mg/dL (ref 8.4–10.4)
Chloride: 102 mEq/L (ref 98–107)
Potassium: 4 mEq/L (ref 3.5–5.1)
Sodium: 140 mEq/L (ref 136–145)
Total Protein: 7.1 g/dL (ref 6.4–8.3)

## 2012-05-23 LAB — LACTATE DEHYDROGENASE (CC13): LDH: 179 U/L (ref 125–245)

## 2012-05-23 LAB — MORPHOLOGY: PLT EST: ADEQUATE

## 2012-05-28 ENCOUNTER — Other Ambulatory Visit: Payer: Self-pay | Admitting: *Deleted

## 2012-05-28 DIAGNOSIS — C9211 Chronic myeloid leukemia, BCR/ABL-positive, in remission: Secondary | ICD-10-CM

## 2012-05-28 MED ORDER — IMATINIB MESYLATE 400 MG PO TABS: 400.0000 mg | ORAL_TABLET | Freq: Every day | ORAL | Status: DC

## 2012-06-14 ENCOUNTER — Other Ambulatory Visit: Payer: Self-pay

## 2012-07-18 ENCOUNTER — Other Ambulatory Visit (HOSPITAL_BASED_OUTPATIENT_CLINIC_OR_DEPARTMENT_OTHER): Payer: BC Managed Care – PPO | Admitting: Lab

## 2012-07-18 DIAGNOSIS — C9211 Chronic myeloid leukemia, BCR/ABL-positive, in remission: Secondary | ICD-10-CM

## 2012-07-18 LAB — COMPREHENSIVE METABOLIC PANEL (CC13)
CO2: 28 mEq/L (ref 22–29)
Calcium: 9.3 mg/dL (ref 8.4–10.4)
Chloride: 104 mEq/L (ref 98–107)
Creatinine: 1.2 mg/dL (ref 0.7–1.3)
Glucose: 92 mg/dl (ref 70–99)
Total Bilirubin: 0.46 mg/dL (ref 0.20–1.20)

## 2012-07-18 LAB — CBC WITH DIFFERENTIAL/PLATELET
BASO%: 0.5 % (ref 0.0–2.0)
EOS%: 6 % (ref 0.0–7.0)
HCT: 37.3 % — ABNORMAL LOW (ref 38.4–49.9)
MCH: 29.8 pg (ref 27.2–33.4)
MCHC: 33.5 g/dL (ref 32.0–36.0)
NEUT%: 49.1 % (ref 39.0–75.0)
RDW: 12.7 % (ref 11.0–14.6)
lymph#: 1.8 10*3/uL (ref 0.9–3.3)

## 2012-07-18 LAB — MORPHOLOGY

## 2012-09-12 ENCOUNTER — Other Ambulatory Visit (HOSPITAL_BASED_OUTPATIENT_CLINIC_OR_DEPARTMENT_OTHER): Payer: BC Managed Care – PPO | Admitting: Lab

## 2012-09-12 DIAGNOSIS — C9211 Chronic myeloid leukemia, BCR/ABL-positive, in remission: Secondary | ICD-10-CM

## 2012-09-12 LAB — CBC WITH DIFFERENTIAL/PLATELET
BASO%: 1.1 % (ref 0.0–2.0)
Eosinophils Absolute: 0.4 10*3/uL (ref 0.0–0.5)
HCT: 36.9 % — ABNORMAL LOW (ref 38.4–49.9)
LYMPH%: 32.9 % (ref 14.0–49.0)
MCHC: 33.4 g/dL (ref 32.0–36.0)
MONO#: 0.6 10*3/uL (ref 0.1–0.9)
NEUT#: 2.5 10*3/uL (ref 1.5–6.5)
NEUT%: 47.7 % (ref 39.0–75.0)
Platelets: 192 10*3/uL (ref 140–400)
RBC: 4.16 10*6/uL — ABNORMAL LOW (ref 4.20–5.82)
WBC: 5.2 10*3/uL (ref 4.0–10.3)
lymph#: 1.7 10*3/uL (ref 0.9–3.3)

## 2012-09-12 LAB — COMPREHENSIVE METABOLIC PANEL (CC13)
ALT: 14 U/L (ref 0–55)
AST: 18 U/L (ref 5–34)
Alkaline Phosphatase: 71 U/L (ref 40–150)
CO2: 27 mEq/L (ref 22–29)
Creatinine: 1.3 mg/dL (ref 0.7–1.3)
Sodium: 141 mEq/L (ref 136–145)
Total Bilirubin: 0.52 mg/dL (ref 0.20–1.20)
Total Protein: 6.7 g/dL (ref 6.4–8.3)

## 2012-09-12 LAB — MORPHOLOGY: PLT EST: ADEQUATE

## 2012-09-19 LAB — BCR/ABL

## 2012-10-10 ENCOUNTER — Ambulatory Visit (HOSPITAL_BASED_OUTPATIENT_CLINIC_OR_DEPARTMENT_OTHER): Payer: BC Managed Care – PPO | Admitting: Oncology

## 2012-10-10 ENCOUNTER — Encounter: Payer: Self-pay | Admitting: Oncology

## 2012-10-10 ENCOUNTER — Telehealth: Payer: Self-pay | Admitting: Oncology

## 2012-10-10 VITALS — BP 143/89 | HR 90 | Temp 98.0°F | Resp 18 | Ht 69.0 in | Wt 177.4 lb

## 2012-10-10 DIAGNOSIS — M778 Other enthesopathies, not elsewhere classified: Secondary | ICD-10-CM

## 2012-10-10 DIAGNOSIS — C9211 Chronic myeloid leukemia, BCR/ABL-positive, in remission: Secondary | ICD-10-CM

## 2012-10-10 DIAGNOSIS — M67919 Unspecified disorder of synovium and tendon, unspecified shoulder: Secondary | ICD-10-CM

## 2012-10-10 HISTORY — DX: Other enthesopathies, not elsewhere classified: M77.8

## 2012-10-10 NOTE — Progress Notes (Signed)
Hematology and Oncology Follow Up Visit  Patrick Nash [id number] 24-Nov-1960 52 y.o. 10/10/2012 6:40 PM   Principle Diagnosis: Encounter Diagnosis  Name Primary?  . 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 He was one of the first patients I treated with Gleevec without using any other induction chemotherapy to lower his white count. He achieved a rapid molecular response. We were testing different dose levels at that time and his dose was escalated from the standard 400 mg daily dose to 800 mg. I have been able to taper his dose over time and he is currently down to just 400 mg a day and he is maintaining an excellent molecular response consistently less than 3 logs below baseline. Most recent testing done on 09/12/2012 remains stable.  His main complaint today is some intermittent pain posterior upper left shoulder which began after he helped his father move from his house to an apartment and was doing considerable heavy lifting. He still has full range of motion around the joint. Pain is intermittent. He has really taken no medication except for an occasional aspirin for relief.  Medications: reviewed  Allergies: No Known Allergies  Review of Systems: Constitutional:   No constitutional symptoms Respiratory: No cough or dyspnea Cardiovascular:  No chest pain or palpitations Gastrointestinal: No change in bowel habit Genito-Urinary:  Musculoskeletal: See above Neurologic: No headache or change in vision Skin: No rash or ecchymosis Remaining ROS negative.  Physical Exam: Blood pressure 143/89, pulse 90, temperature 98 F (36.7 C), temperature source Oral, resp. rate 18, height 5\' 9"  (1.753 m), weight 177 lb 6.4 oz  (80.468 kg). Wt Readings from Last 3 Encounters:  10/10/12 177 lb 6.4 oz (80.468 kg)  04/11/12 173 lb 3.2 oz (78.563 kg)  10/05/11 173 lb 9.6 oz (78.744 kg)     General appearance: Well-nourished Caucasian man HENNT: Male pattern baldness. Pharynx no erythema or exudate Lymph nodes: No adenopathy Breasts: Lungs: Clear to auscultation resonant to percussion Heart: Regular rhythm no murmur Abdomen: Soft, nontender, no mass, no organomegaly Extremities: No edema, no calf tenderness Musculoskeletal: Full range of motion at the left shoulder point tenderness posterior superior glenohumeral joint GU: Vascular: No cyanosis Neurologic: Motor strength 5 over 5, reflexes 1+ symmetric Skin: Pale  Lab Results: Lab Results  Component Value Date   WBC 5.2 09/12/2012   HGB 12.3* 09/12/2012   HCT 36.9* 09/12/2012   MCV 88.7 09/12/2012   PLT 192 09/12/2012  White count differential: 48 neutrophils, 33 lymphocytes, 11 monocytes, 6 eosinophils, 1 basophil,   Chemistry      Component Value Date/Time   NA 141 09/12/2012 1119   NA 140 11/30/2011 1112   K 4.2 09/12/2012 1119   K 4.2 11/30/2011 1112   CL 103 09/12/2012 1119   CL 104 11/30/2011 1112   CO2 27 09/12/2012 1119   CO2 29 11/30/2011 1112   BUN 16.6 09/12/2012 1119   BUN 18 11/30/2011 1112   CREATININE 1.3 09/12/2012 1119   CREATININE 1.27 11/30/2011 1112      Component Value Date/Time   CALCIUM 9.1 09/12/2012 1119   CALCIUM 9.3 11/30/2011 1112   ALKPHOS 71 09/12/2012 1119   ALKPHOS 61 11/30/2011 1112   AST 18  09/12/2012 1119   AST 19 11/30/2011 1112   ALT 14 09/12/2012 1119   ALT 15 11/30/2011 1112   BILITOT 0.52 09/12/2012 1119   BILITOT 0.4 11/30/2011 1112       Impression: #1. BCR-ABL chronic myeloid leukemia. He remains an ongoing hematologic and molecular remission now out 9-1/2 years. Plan continue Gleevec 400 mg daily. Routine lab every 2 months. Followup BCR-ABL analysis every 6 months.  #2. Minor tendon injury left rotator cuff I told him it  may take some time to heal. I told him not to over exercise the arm and to use aspirin or nonsteroidals as needed for discomfort. I do not think he needs an orthopedic surgery referral at this point.     CC:.    Levert Feinstein, MD 6/13/20146:40 PM

## 2012-10-10 NOTE — Telephone Encounter (Signed)
gv and printed appt sched and avs for pt  °

## 2012-10-22 ENCOUNTER — Other Ambulatory Visit: Payer: Self-pay | Admitting: *Deleted

## 2012-10-22 DIAGNOSIS — C9211 Chronic myeloid leukemia, BCR/ABL-positive, in remission: Secondary | ICD-10-CM

## 2012-10-22 MED ORDER — IMATINIB MESYLATE 400 MG PO TABS: 400.0000 mg | ORAL_TABLET | Freq: Every day | ORAL | Status: DC

## 2012-12-08 ENCOUNTER — Other Ambulatory Visit (HOSPITAL_BASED_OUTPATIENT_CLINIC_OR_DEPARTMENT_OTHER): Payer: BC Managed Care – PPO

## 2012-12-08 DIAGNOSIS — C9211 Chronic myeloid leukemia, BCR/ABL-positive, in remission: Secondary | ICD-10-CM

## 2012-12-08 LAB — COMPREHENSIVE METABOLIC PANEL (CC13)
ALT: 11 U/L (ref 0–55)
Albumin: 3.7 g/dL (ref 3.5–5.0)
Alkaline Phosphatase: 68 U/L (ref 40–150)
Glucose: 98 mg/dl (ref 70–140)
Potassium: 4 mEq/L (ref 3.5–5.1)
Sodium: 141 mEq/L (ref 136–145)
Total Protein: 6.6 g/dL (ref 6.4–8.3)

## 2012-12-08 LAB — CBC WITH DIFFERENTIAL/PLATELET
BASO%: 1 % (ref 0.0–2.0)
Eosinophils Absolute: 0.3 10*3/uL (ref 0.0–0.5)
MCHC: 34.3 g/dL (ref 32.0–36.0)
MONO#: 0.6 10*3/uL (ref 0.1–0.9)
MONO%: 10.6 % (ref 0.0–14.0)
NEUT#: 3.4 10*3/uL (ref 1.5–6.5)
RBC: 4.04 10*6/uL — ABNORMAL LOW (ref 4.20–5.82)
RDW: 12.7 % (ref 11.0–14.6)
WBC: 5.8 10*3/uL (ref 4.0–10.3)

## 2013-02-02 ENCOUNTER — Other Ambulatory Visit (HOSPITAL_BASED_OUTPATIENT_CLINIC_OR_DEPARTMENT_OTHER): Payer: BC Managed Care – PPO

## 2013-02-02 DIAGNOSIS — C9211 Chronic myeloid leukemia, BCR/ABL-positive, in remission: Secondary | ICD-10-CM

## 2013-02-02 LAB — COMPREHENSIVE METABOLIC PANEL (CC13)
ALT: 9 U/L (ref 0–55)
AST: 16 U/L (ref 5–34)
Albumin: 3.8 g/dL (ref 3.5–5.0)
Alkaline Phosphatase: 63 U/L (ref 40–150)
BUN: 16.1 mg/dL (ref 7.0–26.0)
Creatinine: 1.2 mg/dL (ref 0.7–1.3)
Total Bilirubin: 0.41 mg/dL (ref 0.20–1.20)

## 2013-02-02 LAB — MORPHOLOGY

## 2013-02-02 LAB — CBC WITH DIFFERENTIAL/PLATELET
BASO%: 1 % (ref 0.0–2.0)
EOS%: 7.1 % — ABNORMAL HIGH (ref 0.0–7.0)
Eosinophils Absolute: 0.4 10*3/uL (ref 0.0–0.5)
LYMPH%: 31.3 % (ref 14.0–49.0)
MCH: 30.2 pg (ref 27.2–33.4)
MCHC: 33.7 g/dL (ref 32.0–36.0)
MCV: 89.6 fL (ref 79.3–98.0)
MONO%: 11.9 % (ref 0.0–14.0)
NEUT#: 2.8 10*3/uL (ref 1.5–6.5)
RBC: 4.03 10*6/uL — ABNORMAL LOW (ref 4.20–5.82)
RDW: 12.6 % (ref 11.0–14.6)

## 2013-03-05 ENCOUNTER — Other Ambulatory Visit: Payer: Self-pay

## 2013-03-06 ENCOUNTER — Other Ambulatory Visit: Payer: Self-pay | Admitting: *Deleted

## 2013-03-09 ENCOUNTER — Other Ambulatory Visit: Payer: BC Managed Care – PPO

## 2013-03-09 DIAGNOSIS — C9211 Chronic myeloid leukemia, BCR/ABL-positive, in remission: Secondary | ICD-10-CM

## 2013-03-10 ENCOUNTER — Telehealth: Payer: Self-pay | Admitting: Oncology

## 2013-03-10 NOTE — Telephone Encounter (Signed)
, °

## 2013-03-30 ENCOUNTER — Telehealth: Payer: Self-pay | Admitting: Oncology

## 2013-03-30 ENCOUNTER — Other Ambulatory Visit (HOSPITAL_BASED_OUTPATIENT_CLINIC_OR_DEPARTMENT_OTHER): Payer: BC Managed Care – PPO

## 2013-03-30 ENCOUNTER — Ambulatory Visit (HOSPITAL_BASED_OUTPATIENT_CLINIC_OR_DEPARTMENT_OTHER): Payer: BC Managed Care – PPO | Admitting: Oncology

## 2013-03-30 VITALS — BP 140/82 | HR 89 | Temp 98.3°F | Resp 20 | Ht 69.0 in | Wt 173.5 lb

## 2013-03-30 DIAGNOSIS — C9211 Chronic myeloid leukemia, BCR/ABL-positive, in remission: Secondary | ICD-10-CM

## 2013-03-30 LAB — CBC WITH DIFFERENTIAL/PLATELET
Basophils Absolute: 0 10*3/uL (ref 0.0–0.1)
EOS%: 6 % (ref 0.0–7.0)
Eosinophils Absolute: 0.3 10*3/uL (ref 0.0–0.5)
HCT: 36 % — ABNORMAL LOW (ref 38.4–49.9)
HGB: 11.8 g/dL — ABNORMAL LOW (ref 13.0–17.1)
MCH: 30.2 pg (ref 27.2–33.4)
MCV: 92.1 fL (ref 79.3–98.0)
MONO%: 11.1 % (ref 0.0–14.0)
NEUT#: 3 10*3/uL (ref 1.5–6.5)
NEUT%: 53.2 % (ref 39.0–75.0)
RBC: 3.91 10*6/uL — ABNORMAL LOW (ref 4.20–5.82)
RDW: 12.7 % (ref 11.0–14.6)
WBC: 5.6 10*3/uL (ref 4.0–10.3)
lymph#: 1.6 10*3/uL (ref 0.9–3.3)

## 2013-03-30 LAB — COMPREHENSIVE METABOLIC PANEL (CC13)
AST: 20 U/L (ref 5–34)
Albumin: 3.9 g/dL (ref 3.5–5.0)
Alkaline Phosphatase: 66 U/L (ref 40–150)
BUN: 16.6 mg/dL (ref 7.0–26.0)
Calcium: 9.2 mg/dL (ref 8.4–10.4)
Chloride: 106 mEq/L (ref 98–109)
Potassium: 4 mEq/L (ref 3.5–5.1)
Sodium: 141 mEq/L (ref 136–145)
Total Protein: 6.6 g/dL (ref 6.4–8.3)

## 2013-03-30 LAB — MORPHOLOGY

## 2013-03-30 NOTE — Progress Notes (Signed)
Hematology and Oncology Follow Up Visit  Aiyden Lauderback [id number] 1960/07/26 52 y.o. 03/30/2013 2:44 PM   Principle Diagnosis: Encounter Diagnosis  Name Primary?  . CML in remission Yes     Interim History:   It is hard to believe that Mr. Hackbart has now been on Gleevec for almost 10 years and remains in a molecular remission. He was diagnosed with CML in December 2004 when he presented with marked leukocytosis, white count 340,000, massive splenomegaly, with additional findings of retinal hemorrhages  on exam. He was initially treated with Gleevec at 400 mg daily, subsequently escalated to 800 mg, and then over time, decreased to current dose of 400 mg. He achieved a rapid initial major molecular response. He has been able to maintain this over time despite dose reduction. Most recent BCR-ABL analysis done on 03/09/2013.  He's had no interim problems. He declined a flu vaccine.  Medications: reviewed  Allergies: No Known Allergies  Review of Systems: Hematology:  See above ENT ROS: No sore throat Breast ROS:  Respiratory ROS: No cough or dyspnea Cardiovascular ROS:   No chest pain or palpitations Gastrointestinal ROS:  No abdominal pain Genito-Urinary ROS: Not questioned Musculoskeletal ROS: Improved left shoulder pain Neurological ROS: No headache or change in vision Dermatological ROS: No rash or ecchymosis Remaining ROS negative.  Physical Exam: Blood pressure 140/82, pulse 89, temperature 98.3 F (36.8 C), temperature source Oral, resp. rate 20, height 5\' 9"  (1.753 m), weight 173 lb 8 oz (78.699 kg). Wt Readings from Last 3 Encounters:  03/30/13 173 lb 8 oz (78.699 kg)  10/10/12 177 lb 6.4 oz (80.468 kg)  04/11/12 173 lb 3.2 oz (78.563 kg)     General appearance: Well-nourished Caucasian man. HENNT: Male pattern baldness. Pharynx no erythema, exudate, mass, or ulcer. No thyromegaly or thyroid nodules Lymph nodes: No cervical, supraclavicular, or axillary  lymphadenopathy Breasts:  Lungs: Clear to auscultation, resonant to percussion throughout Heart: Regular rhythm, no murmur, no gallop, no rub, no click, no edema Abdomen: Soft, nontender, normal bowel sounds, no mass, no organomegaly Extremities: No edema, no calf tenderness Musculoskeletal: no joint deformities GU: Vascular: Carotid pulses 2+, no bruits, distal pulses: Dorsalis pedis 1+ symmetric Neurologic: Alert, oriented, PERRLA, , cranial nerves grossly normal, motor strength 5 over 5, reflexes 1+ symmetric, upper body coordination normal, gait normal, Skin: No rash or ecchymosis  Lab Results: CBC W/Diff    Component Value Date/Time   WBC 5.6 03/30/2013 1049   RBC 3.91* 03/30/2013 1049   HGB 11.8* 03/30/2013 1049   HCT 36.0* 03/30/2013 1049   PLT 192 03/30/2013 1049   MCV 92.1 03/30/2013 1049   MCH 30.2 03/30/2013 1049   MCH 31.6 05/30/2010 1301   MCHC 32.7 03/30/2013 1049   RDW 12.7 03/30/2013 1049   LYMPHSABS 1.6 03/30/2013 1049   MONOABS 0.6 03/30/2013 1049   EOSABS 0.3 03/30/2013 1049   BASOSABS 0.0 03/30/2013 1049     Chemistry      Component Value Date/Time   NA 141 03/30/2013 1050   NA 140 11/30/2011 1112   K 4.0 03/30/2013 1050   K 4.2 11/30/2011 1112   CL 103 09/12/2012 1119   CL 104 11/30/2011 1112   CO2 27 03/30/2013 1050   CO2 29 11/30/2011 1112   BUN 16.6 03/30/2013 1050   BUN 18 11/30/2011 1112   CREATININE 1.2 03/30/2013 1050   CREATININE 1.27 11/30/2011 1112      Component Value Date/Time   CALCIUM 9.2 03/30/2013 1050  CALCIUM 9.3 11/30/2011 1112   ALKPHOS 66 03/30/2013 1050   ALKPHOS 61 11/30/2011 1112   AST 20 03/30/2013 1050   AST 19 11/30/2011 1112   ALT 14 03/30/2013 1050   ALT 15 11/30/2011 1112   BILITOT 0.52 03/30/2013 1050   BILITOT 0.4 11/30/2011 1112       Impression:  CML in ongoing molecular remission 10 years from diagnosis. I'm not getting labs every 2 months and molecular markers every 6 months He will continue Gleevec 400 mg daily.   CC: Patient Care  Team: No Pcp Per Patient as PCP - General (General Practice)   Levert Feinstein, MD 12/1/20142:44 PM

## 2013-03-30 NOTE — Telephone Encounter (Signed)
POF dd not come into message box but pt told me what he understood plan to be appts made based on pts work with understanding if not correct we would call him AVS and CAL for Jan and March given shh

## 2013-04-15 ENCOUNTER — Other Ambulatory Visit: Payer: Self-pay | Admitting: *Deleted

## 2013-04-15 DIAGNOSIS — C9211 Chronic myeloid leukemia, BCR/ABL-positive, in remission: Secondary | ICD-10-CM

## 2013-04-15 MED ORDER — IMATINIB MESYLATE 400 MG PO TABS: 400.0000 mg | ORAL_TABLET | Freq: Every day | ORAL | Status: DC

## 2013-04-15 NOTE — Addendum Note (Signed)
Addended by: Wandalee Ferdinand on: 04/15/2013 01:45 PM   Modules accepted: Orders

## 2013-04-15 NOTE — Telephone Encounter (Signed)
Refill request to MD for approval. 

## 2013-05-11 ENCOUNTER — Other Ambulatory Visit (HOSPITAL_BASED_OUTPATIENT_CLINIC_OR_DEPARTMENT_OTHER): Payer: BC Managed Care – PPO

## 2013-05-11 DIAGNOSIS — C9211 Chronic myeloid leukemia, BCR/ABL-positive, in remission: Secondary | ICD-10-CM

## 2013-05-11 LAB — COMPREHENSIVE METABOLIC PANEL (CC13)
ALT: 15 U/L (ref 0–55)
AST: 19 U/L (ref 5–34)
Albumin: 3.9 g/dL (ref 3.5–5.0)
Alkaline Phosphatase: 82 U/L (ref 40–150)
Anion Gap: 10 mEq/L (ref 3–11)
BILIRUBIN TOTAL: 0.41 mg/dL (ref 0.20–1.20)
BUN: 18.8 mg/dL (ref 7.0–26.0)
CO2: 27 mEq/L (ref 22–29)
Calcium: 9.3 mg/dL (ref 8.4–10.4)
Chloride: 104 mEq/L (ref 98–109)
Creatinine: 1.3 mg/dL (ref 0.7–1.3)
Glucose: 94 mg/dl (ref 70–140)
Potassium: 4.3 mEq/L (ref 3.5–5.1)
SODIUM: 141 meq/L (ref 136–145)
TOTAL PROTEIN: 6.6 g/dL (ref 6.4–8.3)

## 2013-05-11 LAB — CBC WITH DIFFERENTIAL/PLATELET
BASO%: 1.2 % (ref 0.0–2.0)
Basophils Absolute: 0.1 10*3/uL (ref 0.0–0.1)
EOS%: 13.3 % — ABNORMAL HIGH (ref 0.0–7.0)
Eosinophils Absolute: 0.7 10*3/uL — ABNORMAL HIGH (ref 0.0–0.5)
HCT: 37 % — ABNORMAL LOW (ref 38.4–49.9)
HGB: 12.4 g/dL — ABNORMAL LOW (ref 13.0–17.1)
LYMPH%: 26.7 % (ref 14.0–49.0)
MCH: 30.5 pg (ref 27.2–33.4)
MCHC: 33.5 g/dL (ref 32.0–36.0)
MCV: 91.2 fL (ref 79.3–98.0)
MONO#: 0.7 10*3/uL (ref 0.1–0.9)
MONO%: 13.2 % (ref 0.0–14.0)
NEUT#: 2.4 10*3/uL (ref 1.5–6.5)
NEUT%: 45.6 % (ref 39.0–75.0)
Platelets: 209 10*3/uL (ref 140–400)
RBC: 4.06 10*6/uL — ABNORMAL LOW (ref 4.20–5.82)
RDW: 12.4 % (ref 11.0–14.6)
WBC: 5.3 10*3/uL (ref 4.0–10.3)
lymph#: 1.4 10*3/uL (ref 0.9–3.3)

## 2013-05-11 LAB — MORPHOLOGY: PLT EST: ADEQUATE

## 2013-05-11 LAB — LACTATE DEHYDROGENASE (CC13): LDH: 175 U/L (ref 125–245)

## 2013-06-01 ENCOUNTER — Telehealth: Payer: Self-pay | Admitting: *Deleted

## 2013-06-01 NOTE — Telephone Encounter (Signed)
sw pt and he is already aware of his appt for 07/15/13@ 11:30am...td

## 2013-06-29 ENCOUNTER — Encounter: Payer: Self-pay | Admitting: Oncology

## 2013-07-06 ENCOUNTER — Other Ambulatory Visit (HOSPITAL_BASED_OUTPATIENT_CLINIC_OR_DEPARTMENT_OTHER): Payer: BC Managed Care – PPO

## 2013-07-06 DIAGNOSIS — C9211 Chronic myeloid leukemia, BCR/ABL-positive, in remission: Secondary | ICD-10-CM

## 2013-07-06 LAB — CBC WITH DIFFERENTIAL/PLATELET
BASO%: 1.2 % (ref 0.0–2.0)
BASOS ABS: 0.1 10*3/uL (ref 0.0–0.1)
EOS%: 8.4 % — AB (ref 0.0–7.0)
Eosinophils Absolute: 0.5 10*3/uL (ref 0.0–0.5)
HCT: 36.1 % — ABNORMAL LOW (ref 38.4–49.9)
HEMOGLOBIN: 11.9 g/dL — AB (ref 13.0–17.1)
LYMPH%: 28.6 % (ref 14.0–49.0)
MCH: 30.1 pg (ref 27.2–33.4)
MCHC: 33 g/dL (ref 32.0–36.0)
MCV: 91.2 fL (ref 79.3–98.0)
MONO#: 0.6 10*3/uL (ref 0.1–0.9)
MONO%: 11 % (ref 0.0–14.0)
NEUT#: 2.8 10*3/uL (ref 1.5–6.5)
NEUT%: 50.8 % (ref 39.0–75.0)
Platelets: 198 10*3/uL (ref 140–400)
RBC: 3.96 10*6/uL — ABNORMAL LOW (ref 4.20–5.82)
RDW: 12.6 % (ref 11.0–14.6)
WBC: 5.6 10*3/uL (ref 4.0–10.3)
lymph#: 1.6 10*3/uL (ref 0.9–3.3)

## 2013-07-06 LAB — COMPREHENSIVE METABOLIC PANEL (CC13)
ALT: 15 U/L (ref 0–55)
AST: 18 U/L (ref 5–34)
Albumin: 4 g/dL (ref 3.5–5.0)
Alkaline Phosphatase: 68 U/L (ref 40–150)
Anion Gap: 6 mEq/L (ref 3–11)
BUN: 16.5 mg/dL (ref 7.0–26.0)
CALCIUM: 8.9 mg/dL (ref 8.4–10.4)
CHLORIDE: 107 meq/L (ref 98–109)
CO2: 29 meq/L (ref 22–29)
CREATININE: 1.1 mg/dL (ref 0.7–1.3)
Glucose: 105 mg/dl (ref 70–140)
POTASSIUM: 4.3 meq/L (ref 3.5–5.1)
Sodium: 141 mEq/L (ref 136–145)
TOTAL PROTEIN: 6.5 g/dL (ref 6.4–8.3)
Total Bilirubin: 0.43 mg/dL (ref 0.20–1.20)

## 2013-07-06 LAB — MORPHOLOGY: PLT EST: ADEQUATE

## 2013-07-06 LAB — LACTATE DEHYDROGENASE (CC13): LDH: 180 U/L (ref 125–245)

## 2013-07-15 ENCOUNTER — Ambulatory Visit (HOSPITAL_BASED_OUTPATIENT_CLINIC_OR_DEPARTMENT_OTHER): Payer: BC Managed Care – PPO | Admitting: Oncology

## 2013-07-15 ENCOUNTER — Telehealth: Payer: Self-pay | Admitting: Oncology

## 2013-07-15 VITALS — BP 150/69 | HR 80 | Temp 97.7°F | Resp 18 | Ht 69.0 in | Wt 173.5 lb

## 2013-07-15 DIAGNOSIS — C9211 Chronic myeloid leukemia, BCR/ABL-positive, in remission: Secondary | ICD-10-CM

## 2013-07-15 NOTE — Telephone Encounter (Signed)
gave pt appt for labs only until Septtember 2015

## 2013-07-15 NOTE — Progress Notes (Signed)
Hematology and Oncology Follow Up Visit  Patrick Nash [id number] Aug 02, 1960 53 y.o. 07/15/2013 1:42 PM   Principle Diagnosis: Encounter Diagnosis  Name Primary?  . 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 Hehas now been on [place] for over 10 years and remains in a molecular remission. 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 He was initially treated with Gleevec at 400 mg daily, subsequently escalated to 800 mg, and then over time, decreased to current dose of 400 mg. He achieved a rapid initial major molecular response. He has been able to maintain this over time despite dose reduction. Most recent BCR-ABL analysis done on 07/06/2013 continues to show molecular remission.   He slipped and fell on the ice a few weeks ago. He is still having pain in the right sacral area with some radiation down his right leg. He had a previous radiculopathy also affecting the right lower extremity. He is slowly improving.no other interim problems.   Medications: reviewed  Allergies: No Known Allergies  Review of Systems: Hematology:  No bleeding or bruising ENT ROS: no sore throat Breast ROS:  Respiratory ROS: no cough or dyspnea Cardiovascular ROS:  No chest pain or palpitations Gastrointestinal ROS:  No abdominal pain or change about the  Genito-Urinary ROS: not questioned Musculoskeletal ROS: see above Neurological ROS: no headache or change in vision Dermatological ROS: no rash Remaining ROS negative:   Physical Exam: Blood pressure 150/69, pulse 80, temperature 97.7 F (36.5 C), temperature source Oral, resp. rate 18, height 5\' 9"  (1.753 m), weight 173 lb 8 oz (78.699 kg), SpO2 99.00%. Wt Readings from Last 3 Encounters:  07/15/13 173 lb 8 oz (78.699 kg)   03/30/13 173 lb 8 oz (78.699 kg)  10/10/12 177 lb 6.4 oz (80.468 kg)     General appearance: Pale caucasian man HENNT: Pharynx no erythema, exudate, mass, or ulcer. No thyromegaly or thyroid nodules Lymph nodes: No cervical, supraclavicular, or axillary lymphadenopathy Breasts:  Lungs: Clear to auscultation, resonant to percussion throughout Heart: Regular rhythm, no murmur, no gallop, no rub, no click, no edema Abdomen: Soft, nontender, normal bowel sounds, no mass, no organomegaly Extremities: No edema, no calf tenderness Musculoskeletal: no joint deformities GU:  Vascular: Carotid pulses 2+, no bruits, Neurologic: Alert, oriented, PERRLA, cranial nerves grossly normal, motor strength 5 over 5, reflexes 3+ symmetric at the knees, upper body coordination normal, gait normal, Skin: No rash or ecchymosis  Lab Results: CBC W/Diff    Component Value Date/Time   WBC 5.6 07/06/2013 1108   RBC 3.96* 07/06/2013 1108   HGB 11.9* 07/06/2013 1108   HCT 36.1* 07/06/2013 1108   PLT 198 07/06/2013 1108   MCV 91.2 07/06/2013 1108   MCH 30.1 07/06/2013 1108   MCH 31.6 05/30/2010 1301   MCHC 33.0 07/06/2013 1108   RDW 12.6 07/06/2013 1108   LYMPHSABS 1.6 07/06/2013 1108   MONOABS 0.6 07/06/2013 1108   EOSABS 0.5 07/06/2013 1108   BASOSABS 0.1 07/06/2013 1108     Chemistry      Component Value Date/Time   NA 141 07/06/2013 1108   NA 140 11/30/2011 1112   K 4.3 07/06/2013 1108   K 4.2 11/30/2011 1112   CL 103 09/12/2012 1119   CL 104 11/30/2011 1112   CO2 29 07/06/2013 1108  CO2 29 11/30/2011 1112   BUN 16.5 07/06/2013 1108   BUN 18 11/30/2011 1112   CREATININE 1.1 07/06/2013 1108   CREATININE 1.27 11/30/2011 1112      Component Value Date/Time   CALCIUM 8.9 07/06/2013 1108   CALCIUM 9.3 11/30/2011 1112   ALKPHOS 68 07/06/2013 1108   ALKPHOS 61 11/30/2011 1112   AST 18 07/06/2013 1108   AST 19 11/30/2011 1112   ALT 15 07/06/2013 1108   ALT 15 11/30/2011 1112   BILITOT 0.43 07/06/2013 1108   BILITOT 0.4 11/30/2011 1112          Impression:  Chronic myeloid leukemia in ongoing molecular remission. Continue Gleevec at current dose. Continue to monitor CBC and chem profile every 2 months, molecular markers every 6 months along with clinical exams.I would like to keep getting the molecular markers through the [facility] lab since I get consistently good results using this lab.   CC: Patient Care Team: No Pcp Per Patient as PCP - General (General Practice)   Annia Belt, MD 3/18/20151:42 PM

## 2013-07-17 ENCOUNTER — Ambulatory Visit: Payer: BC Managed Care – PPO | Admitting: Oncology

## 2013-07-17 LAB — BCR/ABL (LIO MMD)

## 2013-09-14 ENCOUNTER — Other Ambulatory Visit (HOSPITAL_BASED_OUTPATIENT_CLINIC_OR_DEPARTMENT_OTHER): Payer: BC Managed Care – PPO

## 2013-09-14 DIAGNOSIS — C9211 Chronic myeloid leukemia, BCR/ABL-positive, in remission: Secondary | ICD-10-CM

## 2013-09-14 LAB — CBC WITH DIFFERENTIAL/PLATELET
BASO%: 1 % (ref 0.0–2.0)
Basophils Absolute: 0.1 10*3/uL (ref 0.0–0.1)
EOS%: 5 % (ref 0.0–7.0)
Eosinophils Absolute: 0.3 10*3/uL (ref 0.0–0.5)
HEMATOCRIT: 35.8 % — AB (ref 38.4–49.9)
HGB: 11.8 g/dL — ABNORMAL LOW (ref 13.0–17.1)
LYMPH%: 29.4 % (ref 14.0–49.0)
MCH: 29.6 pg (ref 27.2–33.4)
MCHC: 32.9 g/dL (ref 32.0–36.0)
MCV: 89.9 fL (ref 79.3–98.0)
MONO#: 0.5 10*3/uL (ref 0.1–0.9)
MONO%: 9.7 % (ref 0.0–14.0)
NEUT#: 2.9 10*3/uL (ref 1.5–6.5)
NEUT%: 54.9 % (ref 39.0–75.0)
PLATELETS: 185 10*3/uL (ref 140–400)
RBC: 3.98 10*6/uL — ABNORMAL LOW (ref 4.20–5.82)
RDW: 13 % (ref 11.0–14.6)
WBC: 5.4 10*3/uL (ref 4.0–10.3)
lymph#: 1.6 10*3/uL (ref 0.9–3.3)

## 2013-09-14 LAB — COMPREHENSIVE METABOLIC PANEL (CC13)
ALT: 12 U/L (ref 0–55)
ANION GAP: 9 meq/L (ref 3–11)
AST: 16 U/L (ref 5–34)
Albumin: 4 g/dL (ref 3.5–5.0)
Alkaline Phosphatase: 66 U/L (ref 40–150)
BILIRUBIN TOTAL: 0.54 mg/dL (ref 0.20–1.20)
BUN: 15.7 mg/dL (ref 7.0–26.0)
CO2: 24 meq/L (ref 22–29)
Calcium: 9 mg/dL (ref 8.4–10.4)
Chloride: 107 mEq/L (ref 98–109)
Creatinine: 1.2 mg/dL (ref 0.7–1.3)
GLUCOSE: 109 mg/dL (ref 70–140)
Potassium: 4 mEq/L (ref 3.5–5.1)
SODIUM: 140 meq/L (ref 136–145)
Total Protein: 6.4 g/dL (ref 6.4–8.3)

## 2013-09-14 LAB — MORPHOLOGY: PLT EST: ADEQUATE

## 2013-09-14 LAB — LACTATE DEHYDROGENASE (CC13): LDH: 173 U/L (ref 125–245)

## 2013-10-15 ENCOUNTER — Other Ambulatory Visit: Payer: Self-pay | Admitting: Oncology

## 2013-11-16 ENCOUNTER — Other Ambulatory Visit (HOSPITAL_BASED_OUTPATIENT_CLINIC_OR_DEPARTMENT_OTHER): Payer: BC Managed Care – PPO

## 2013-11-16 DIAGNOSIS — C9211 Chronic myeloid leukemia, BCR/ABL-positive, in remission: Secondary | ICD-10-CM

## 2013-11-16 LAB — CBC WITH DIFFERENTIAL/PLATELET
BASO%: 0.4 % (ref 0.0–2.0)
Basophils Absolute: 0 10*3/uL (ref 0.0–0.1)
EOS%: 5.7 % (ref 0.0–7.0)
Eosinophils Absolute: 0.3 10*3/uL (ref 0.0–0.5)
HEMATOCRIT: 36.1 % — AB (ref 38.4–49.9)
HGB: 11.9 g/dL — ABNORMAL LOW (ref 13.0–17.1)
LYMPH#: 1.9 10*3/uL (ref 0.9–3.3)
LYMPH%: 38.2 % (ref 14.0–49.0)
MCH: 29.8 pg (ref 27.2–33.4)
MCHC: 33 g/dL (ref 32.0–36.0)
MCV: 90.3 fL (ref 79.3–98.0)
MONO#: 0.4 10*3/uL (ref 0.1–0.9)
MONO%: 9 % (ref 0.0–14.0)
NEUT#: 2.3 10*3/uL (ref 1.5–6.5)
NEUT%: 46.7 % (ref 39.0–75.0)
Platelets: 177 10*3/uL (ref 140–400)
RBC: 4 10*6/uL — ABNORMAL LOW (ref 4.20–5.82)
RDW: 12.6 % (ref 11.0–14.6)
WBC: 4.9 10*3/uL (ref 4.0–10.3)

## 2013-11-16 LAB — COMPREHENSIVE METABOLIC PANEL (CC13)
ALK PHOS: 60 U/L (ref 40–150)
ALT: 12 U/L (ref 0–55)
AST: 19 U/L (ref 5–34)
Albumin: 4 g/dL (ref 3.5–5.0)
Anion Gap: 7 mEq/L (ref 3–11)
BILIRUBIN TOTAL: 0.6 mg/dL (ref 0.20–1.20)
BUN: 15.7 mg/dL (ref 7.0–26.0)
CO2: 26 meq/L (ref 22–29)
Calcium: 9 mg/dL (ref 8.4–10.4)
Chloride: 107 mEq/L (ref 98–109)
Creatinine: 1.2 mg/dL (ref 0.7–1.3)
Glucose: 97 mg/dl (ref 70–140)
Potassium: 3.8 mEq/L (ref 3.5–5.1)
SODIUM: 140 meq/L (ref 136–145)
TOTAL PROTEIN: 6.6 g/dL (ref 6.4–8.3)

## 2013-11-16 LAB — LACTATE DEHYDROGENASE (CC13): LDH: 177 U/L (ref 125–245)

## 2013-11-16 LAB — MORPHOLOGY: PLT EST: ADEQUATE

## 2013-11-18 ENCOUNTER — Telehealth: Payer: Self-pay | Admitting: *Deleted

## 2013-11-18 NOTE — Telephone Encounter (Signed)
Message copied by Ebbie Latus on Wed Nov 18, 2013  1:55 PM ------      Message from: Annia Belt      Created: Tue Nov 17, 2013  1:32 PM       Call pt: lab all stable compared with previous ------

## 2013-11-18 NOTE — Telephone Encounter (Signed)
Called pt - pt informed labs remain stable as compared to his previous labs per Dr. Beryle Beams.  Stated he usually look up his results on My Chart.

## 2014-01-18 ENCOUNTER — Other Ambulatory Visit (HOSPITAL_BASED_OUTPATIENT_CLINIC_OR_DEPARTMENT_OTHER): Payer: BC Managed Care – PPO

## 2014-01-18 DIAGNOSIS — C9211 Chronic myeloid leukemia, BCR/ABL-positive, in remission: Secondary | ICD-10-CM

## 2014-01-18 LAB — CBC WITH DIFFERENTIAL/PLATELET
BASO%: 0.4 % (ref 0.0–2.0)
Basophils Absolute: 0 10*3/uL (ref 0.0–0.1)
EOS ABS: 0.3 10*3/uL (ref 0.0–0.5)
EOS%: 6.8 % (ref 0.0–7.0)
HEMATOCRIT: 35.4 % — AB (ref 38.4–49.9)
HEMOGLOBIN: 11.6 g/dL — AB (ref 13.0–17.1)
LYMPH%: 35.2 % (ref 14.0–49.0)
MCH: 29.9 pg (ref 27.2–33.4)
MCHC: 32.8 g/dL (ref 32.0–36.0)
MCV: 91.2 fL (ref 79.3–98.0)
MONO#: 0.4 10*3/uL (ref 0.1–0.9)
MONO%: 8.8 % (ref 0.0–14.0)
NEUT%: 48.8 % (ref 39.0–75.0)
NEUTROS ABS: 2.4 10*3/uL (ref 1.5–6.5)
Platelets: 168 10*3/uL (ref 140–400)
RBC: 3.88 10*6/uL — ABNORMAL LOW (ref 4.20–5.82)
RDW: 12.7 % (ref 11.0–14.6)
WBC: 4.9 10*3/uL (ref 4.0–10.3)
lymph#: 1.7 10*3/uL (ref 0.9–3.3)

## 2014-01-18 LAB — COMPREHENSIVE METABOLIC PANEL (CC13)
ALK PHOS: 62 U/L (ref 40–150)
ALT: 11 U/L (ref 0–55)
AST: 18 U/L (ref 5–34)
Albumin: 3.9 g/dL (ref 3.5–5.0)
Anion Gap: 9 mEq/L (ref 3–11)
BUN: 18.9 mg/dL (ref 7.0–26.0)
CALCIUM: 8.9 mg/dL (ref 8.4–10.4)
CHLORIDE: 107 meq/L (ref 98–109)
CO2: 26 mEq/L (ref 22–29)
Creatinine: 1.3 mg/dL (ref 0.7–1.3)
GLUCOSE: 101 mg/dL (ref 70–140)
Potassium: 4 mEq/L (ref 3.5–5.1)
Sodium: 142 mEq/L (ref 136–145)
TOTAL PROTEIN: 6.5 g/dL (ref 6.4–8.3)
Total Bilirubin: 0.43 mg/dL (ref 0.20–1.20)

## 2014-01-18 LAB — MORPHOLOGY
PLT EST: ADEQUATE
RBC Comments: NORMAL

## 2014-01-18 LAB — LACTATE DEHYDROGENASE (CC13): LDH: 171 U/L (ref 125–245)

## 2014-01-28 LAB — BCR/ABL (LIO MMD)

## 2014-02-01 ENCOUNTER — Telehealth: Payer: Self-pay | Admitting: *Deleted

## 2014-02-01 NOTE — Telephone Encounter (Signed)
Message copied by Ebbie Latus on Mon Feb 01, 2014 10:25 AM ------      Message from: Annia Belt      Created: Fri Jan 29, 2014  4:01 PM       Call pt:  Leukemia marker remians undetectable - please send him a copy of report (bcr-abl from 01/18/14 ------

## 2014-02-01 NOTE — Telephone Encounter (Signed)
Pt called/informed leukemia marker remains undetectable per Dr Beryle Beams and I will be mailing him a copy of his labs. Stated he's more concern about Bcr marker.

## 2014-02-15 ENCOUNTER — Other Ambulatory Visit: Payer: Self-pay | Admitting: Oncology

## 2014-02-15 ENCOUNTER — Encounter: Payer: Self-pay | Admitting: Oncology

## 2014-02-15 ENCOUNTER — Ambulatory Visit (INDEPENDENT_AMBULATORY_CARE_PROVIDER_SITE_OTHER): Payer: BC Managed Care – PPO | Admitting: Oncology

## 2014-02-15 VITALS — BP 130/78 | HR 81 | Temp 98.0°F | Resp 20 | Ht 68.0 in | Wt 172.5 lb

## 2014-02-15 DIAGNOSIS — C9211 Chronic myeloid leukemia, BCR/ABL-positive, in remission: Secondary | ICD-10-CM

## 2014-02-15 NOTE — Progress Notes (Signed)
Hematology and Oncology Follow Up Visit  Patrick Nash [id number] [birth year] 53 y.o. 02/15/2014 12:48 PM   Principle Diagnosis: Encounter Diagnosis  Name Primary?  . 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 . He has now been on [place] for over 10 years and remains in a molecular remission. 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 He was initially treated with Gleevec at 400 mg daily, subsequently escalated to 800 mg, and then over time, decreased to current dose of 400 mg. He achieved a rapid initial major molecular response. He has been able to maintain this over time despite dose reduction. Most recent BCR-ABL analysis done on 01/18/2014 continues to show molecular remission.    Medications: reviewed  Allergies: No Known Allergies  Review of Systems: Hematology:   ENT ROS:  Breast ROS:  Respiratory ROS:  Cardiovascular ROS:   Gastrointestinal ROS:    Genito-Urinary ROS:  Musculoskeletal ROS:  Neurological ROS:  Dermatological ROS:  Remaining ROS negative:   Physical Exam: Blood pressure 130/78, pulse 81, temperature 98 F (36.7 C), temperature source Oral, resp. rate 20, height 5\' 8"  (1.727 m), weight 172 lb 8 oz (78.245 kg), SpO2 100.00%. Wt Readings from Last 3 Encounters:  02/15/14 172 lb 8 oz (78.245 kg)  07/15/13 173 lb 8 oz (78.699 kg)  03/30/13 173 lb 8 oz (78.699 kg)     General appearance:  HENNT: Pharynx no erythema, exudate, mass, or ulcer. No thyromegaly or thyroid nodules Lymph nodes: No cervical, supraclavicular, or axillary lymphadenopathy Breasts: No abnormal skin changes, no dominant mass in either breast Lungs: Clear to auscultation, resonant to percussion throughout Heart: Regular rhythm, no murmur, no gallop, no rub, no  click, no edema Abdomen: Soft, nontender, normal bowel sounds, no mass, no organomegaly Extremities: No edema, no calf tenderness Musculoskeletal: no joint deformities GU:  Vascular: Carotid pulses 2+, no bruits, distal pulses: Dorsalis pedis 1+ symmetric Neurologic: Alert, oriented, PERRLA, optic discs sharp and vessels normal, no hemorrhage or exudate, cranial nerves grossly normal, motor strength 5 over 5, reflexes 1+ symmetric, upper body coordination normal, gait normal, Skin: No rash or ecchymosis  Lab Results: CBC W/Diff    Component Value Date/Time   WBC 4.9 01/18/2014 1344   RBC 3.88* 01/18/2014 1344   HGB 11.6* 01/18/2014 1344   HCT 35.4* 01/18/2014 1344   PLT 168 01/18/2014 1344   MCV 91.2 01/18/2014 1344   MCH 29.9 01/18/2014 1344   MCH 31.6 05/30/2010 1301   MCHC 32.8 01/18/2014 1344   RDW 12.7 01/18/2014 1344   LYMPHSABS 1.7 01/18/2014 1344   MONOABS 0.4 01/18/2014 1344   EOSABS 0.3 01/18/2014 1344   BASOSABS 0.0 01/18/2014 1344     Chemistry      Component Value Date/Time   NA 142 01/18/2014 1344   NA 140 11/30/2011 1112   K 4.0 01/18/2014 1344   K 4.2 11/30/2011 1112   CL 103 09/12/2012 1119   CL 104 11/30/2011 1112   CO2 26 01/18/2014 1344   CO2 29 11/30/2011 1112   BUN 18.9 01/18/2014 1344   BUN 18 11/30/2011 1112   CREATININE 1.3 01/18/2014 1344   CREATININE 1.27 11/30/2011 1112      Component Value Date/Time   CALCIUM 8.9 01/18/2014 1344   CALCIUM 9.3 11/30/2011 1112  ALKPHOS 62 01/18/2014 1344   ALKPHOS 61 11/30/2011 1112   AST 18 01/18/2014 1344   AST 19 11/30/2011 1112   ALT 11 01/18/2014 1344   ALT 15 11/30/2011 1112   BILITOT 0.43 01/18/2014 1344   BILITOT 0.4 11/30/2011 1112        Impression:    CC: Patient Care Team: No Pcp Per Patient as PCP - General ([facility])   Annia Belt, MD 10/19/201512:48 PM

## 2014-02-15 NOTE — Patient Instructions (Addendum)
Return visit 6 months Continue lab every 2  Months at [facility] on 07/19/14 MD visit 2-3 weeks after bcr-abl done

## 2014-02-16 ENCOUNTER — Telehealth: Payer: Self-pay | Admitting: Oncology

## 2014-03-15 ENCOUNTER — Other Ambulatory Visit (HOSPITAL_BASED_OUTPATIENT_CLINIC_OR_DEPARTMENT_OTHER): Payer: BC Managed Care – PPO

## 2014-03-15 DIAGNOSIS — C9211 Chronic myeloid leukemia, BCR/ABL-positive, in remission: Secondary | ICD-10-CM

## 2014-03-15 LAB — CBC WITH DIFFERENTIAL/PLATELET
BASO%: 1.1 % (ref 0.0–2.0)
BASOS ABS: 0.1 10*3/uL (ref 0.0–0.1)
EOS ABS: 0.4 10*3/uL (ref 0.0–0.5)
EOS%: 7.6 % — ABNORMAL HIGH (ref 0.0–7.0)
HEMATOCRIT: 38.1 % — AB (ref 38.4–49.9)
HEMOGLOBIN: 12.2 g/dL — AB (ref 13.0–17.1)
LYMPH%: 32.6 % (ref 14.0–49.0)
MCH: 29.6 pg (ref 27.2–33.4)
MCHC: 32.1 g/dL (ref 32.0–36.0)
MCV: 92.4 fL (ref 79.3–98.0)
MONO#: 0.6 10*3/uL (ref 0.1–0.9)
MONO%: 10.6 % (ref 0.0–14.0)
NEUT%: 48.1 % (ref 39.0–75.0)
NEUTROS ABS: 2.5 10*3/uL (ref 1.5–6.5)
PLATELETS: 176 10*3/uL (ref 140–400)
RBC: 4.13 10*6/uL — ABNORMAL LOW (ref 4.20–5.82)
RDW: 12.9 % (ref 11.0–14.6)
WBC: 5.2 10*3/uL (ref 4.0–10.3)
lymph#: 1.7 10*3/uL (ref 0.9–3.3)

## 2014-03-15 LAB — COMPREHENSIVE METABOLIC PANEL (CC13)
ALBUMIN: 4.1 g/dL (ref 3.5–5.0)
ALT: 14 U/L (ref 0–55)
AST: 18 U/L (ref 5–34)
Alkaline Phosphatase: 66 U/L (ref 40–150)
Anion Gap: 6 mEq/L (ref 3–11)
BUN: 21.5 mg/dL (ref 7.0–26.0)
CALCIUM: 9.4 mg/dL (ref 8.4–10.4)
CHLORIDE: 107 meq/L (ref 98–109)
CO2: 29 mEq/L (ref 22–29)
Creatinine: 1.3 mg/dL (ref 0.7–1.3)
GLUCOSE: 102 mg/dL (ref 70–140)
POTASSIUM: 4.7 meq/L (ref 3.5–5.1)
Sodium: 141 mEq/L (ref 136–145)
TOTAL PROTEIN: 6.6 g/dL (ref 6.4–8.3)
Total Bilirubin: 0.37 mg/dL (ref 0.20–1.20)

## 2014-03-15 LAB — MORPHOLOGY
PLT EST: ADEQUATE
RBC Comments: NORMAL
White Cell Comments: 5

## 2014-03-15 LAB — CHCC SMEAR

## 2014-03-15 LAB — LACTATE DEHYDROGENASE (CC13): LDH: 179 U/L (ref 125–245)

## 2014-05-17 ENCOUNTER — Other Ambulatory Visit (HOSPITAL_BASED_OUTPATIENT_CLINIC_OR_DEPARTMENT_OTHER): Payer: 59

## 2014-05-17 DIAGNOSIS — C9211 Chronic myeloid leukemia, BCR/ABL-positive, in remission: Secondary | ICD-10-CM

## 2014-05-17 LAB — COMPREHENSIVE METABOLIC PANEL (CC13)
ALT: 12 U/L (ref 0–55)
AST: 20 U/L (ref 5–34)
Albumin: 4.1 g/dL (ref 3.5–5.0)
Alkaline Phosphatase: 69 U/L (ref 40–150)
Anion Gap: 8 mEq/L (ref 3–11)
BUN: 15.9 mg/dL (ref 7.0–26.0)
CALCIUM: 8.7 mg/dL (ref 8.4–10.4)
CO2: 28 meq/L (ref 22–29)
CREATININE: 1.1 mg/dL (ref 0.7–1.3)
Chloride: 105 mEq/L (ref 98–109)
EGFR: 76 mL/min/{1.73_m2} — ABNORMAL LOW (ref >90)
Glucose: 93 mg/dl (ref 70–140)
Potassium: 3.8 mEq/L (ref 3.5–5.1)
Sodium: 141 mEq/L (ref 136–145)
TOTAL PROTEIN: 6.5 g/dL (ref 6.4–8.3)
Total Bilirubin: 0.45 mg/dL (ref 0.20–1.20)

## 2014-05-17 LAB — CBC WITH DIFFERENTIAL/PLATELET
BASO%: 0.6 % (ref 0.0–2.0)
BASOS ABS: 0 10*3/uL (ref 0.0–0.1)
EOS ABS: 0.4 10*3/uL (ref 0.0–0.5)
EOS%: 7.2 % — ABNORMAL HIGH (ref 0.0–7.0)
HCT: 37.4 % — ABNORMAL LOW (ref 38.4–49.9)
HEMOGLOBIN: 12.1 g/dL — AB (ref 13.0–17.1)
LYMPH%: 29.4 % (ref 14.0–49.0)
MCH: 29.8 pg (ref 27.2–33.4)
MCHC: 32.4 g/dL (ref 32.0–36.0)
MCV: 92.1 fL (ref 79.3–98.0)
MONO#: 0.5 10*3/uL (ref 0.1–0.9)
MONO%: 10.3 % (ref 0.0–14.0)
NEUT#: 2.6 10*3/uL (ref 1.5–6.5)
NEUT%: 52.5 % (ref 39.0–75.0)
Platelets: 191 10*3/uL (ref 140–400)
RBC: 4.06 10*6/uL — AB (ref 4.20–5.82)
RDW: 12.7 % (ref 11.0–14.6)
WBC: 5 10*3/uL (ref 4.0–10.3)
lymph#: 1.5 10*3/uL (ref 0.9–3.3)

## 2014-05-17 LAB — LACTATE DEHYDROGENASE (CC13): LDH: 181 U/L (ref 125–245)

## 2014-05-17 LAB — MORPHOLOGY
PLT EST: ADEQUATE
RBC COMMENTS: NORMAL

## 2014-05-19 ENCOUNTER — Telehealth: Payer: Self-pay

## 2014-05-19 NOTE — Telephone Encounter (Signed)
Pt was assigned to Dr Silvio Pate by [facility] and pt request referral to Dr Beryle Beams to continue getting [place] for CML. Pt called earlier and was advised next available appt may 2016. Pt did not schedule appt yet; pt wants to know if can be seen sooner as new pt or can Dr Silvio Pate do referral before seeing pt. Pt request cb [phone].

## 2014-05-20 NOTE — Telephone Encounter (Signed)
Called patient , referral completed.

## 2014-05-20 NOTE — Telephone Encounter (Signed)
Please proceed with the referral--- he should not have interruption of his Gleevec.  Then send to [facility] to set up appt sooner (like within next month)

## 2014-05-27 ENCOUNTER — Telehealth: Payer: Self-pay | Admitting: *Deleted

## 2014-05-27 NOTE — Telephone Encounter (Signed)
Received faxed documentation from [employer] stating that  PA was on file for pt's [place] that was valid through 05/21/2015.  Not aware of criteria met, as PA was not submitted by me.     REF# [id number] PT ID# [id number]

## 2014-06-10 ENCOUNTER — Encounter: Payer: Self-pay | Admitting: *Deleted

## 2014-07-19 ENCOUNTER — Other Ambulatory Visit (HOSPITAL_BASED_OUTPATIENT_CLINIC_OR_DEPARTMENT_OTHER): Payer: 59

## 2014-07-19 DIAGNOSIS — C9211 Chronic myeloid leukemia, BCR/ABL-positive, in remission: Secondary | ICD-10-CM

## 2014-07-19 LAB — CBC WITH DIFFERENTIAL/PLATELET
BASO%: 0.5 % (ref 0.0–2.0)
Basophils Absolute: 0 10*3/uL (ref 0.0–0.1)
EOS%: 8.7 % — AB (ref 0.0–7.0)
Eosinophils Absolute: 0.5 10*3/uL (ref 0.0–0.5)
HCT: 36.1 % — ABNORMAL LOW (ref 38.4–49.9)
HGB: 11.9 g/dL — ABNORMAL LOW (ref 13.0–17.1)
LYMPH%: 28 % (ref 14.0–49.0)
MCH: 30.5 pg (ref 27.2–33.4)
MCHC: 33 g/dL (ref 32.0–36.0)
MCV: 92.6 fL (ref 79.3–98.0)
MONO#: 0.6 10*3/uL (ref 0.1–0.9)
MONO%: 9.4 % (ref 0.0–14.0)
NEUT#: 3.1 10*3/uL (ref 1.5–6.5)
NEUT%: 53.4 % (ref 39.0–75.0)
Platelets: 182 10*3/uL (ref 140–400)
RBC: 3.9 10*6/uL — ABNORMAL LOW (ref 4.20–5.82)
RDW: 12.9 % (ref 11.0–14.6)
WBC: 5.8 10*3/uL (ref 4.0–10.3)
lymph#: 1.6 10*3/uL (ref 0.9–3.3)

## 2014-07-19 LAB — COMPREHENSIVE METABOLIC PANEL (CC13)
ALT: 14 U/L (ref 0–55)
AST: 16 U/L (ref 5–34)
Albumin: 3.9 g/dL (ref 3.5–5.0)
Alkaline Phosphatase: 60 U/L (ref 40–150)
Anion Gap: 8 mEq/L (ref 3–11)
BUN: 17.3 mg/dL (ref 7.0–26.0)
CHLORIDE: 107 meq/L (ref 98–109)
CO2: 26 meq/L (ref 22–29)
Calcium: 8.7 mg/dL (ref 8.4–10.4)
Creatinine: 1.2 mg/dL (ref 0.7–1.3)
EGFR: 70 mL/min/{1.73_m2} — AB (ref >90)
Glucose: 96 mg/dl (ref 70–140)
POTASSIUM: 4 meq/L (ref 3.5–5.1)
Sodium: 142 mEq/L (ref 136–145)
Total Bilirubin: 0.43 mg/dL (ref 0.20–1.20)
Total Protein: 6.3 g/dL — ABNORMAL LOW (ref 6.4–8.3)

## 2014-08-02 ENCOUNTER — Ambulatory Visit: Payer: BC Managed Care – PPO | Admitting: Oncology

## 2014-08-04 LAB — BCR/ABL (LIO MMD)

## 2014-08-09 ENCOUNTER — Telehealth: Payer: Self-pay | Admitting: *Deleted

## 2014-08-09 NOTE — Telephone Encounter (Signed)
Pt called / informed leukemia marker remains undetectable per Dr Beryle Beams. I will mail a copy of this report to him; pt awared.

## 2014-08-09 NOTE — Telephone Encounter (Signed)
-----   Message from Annia Belt, MD sent at 08/09/2014 11:34 AM EDT ----- Call pt: bcr-abl leukemia marker remains undetectable: we will send him a copy of report

## 2014-08-10 ENCOUNTER — Telehealth: Payer: Self-pay | Admitting: *Deleted

## 2014-08-10 NOTE — Telephone Encounter (Signed)
[employer] called requesting a diagnosis code to fill the [place] for this patient.  Informed them Dr. Beryle Beams is with the [facility] and the number [phone] given.  ICD-10 code needed and per problem list CML in remission = C92.11 given with this call.

## 2014-08-30 ENCOUNTER — Telehealth: Payer: Self-pay | Admitting: Oncology

## 2014-08-30 NOTE — Telephone Encounter (Signed)
Call to patient to confirm appointment for 08/31/14 at 11:15 lmtcb

## 2014-08-31 ENCOUNTER — Ambulatory Visit (INDEPENDENT_AMBULATORY_CARE_PROVIDER_SITE_OTHER): Payer: 59 | Admitting: Oncology

## 2014-08-31 ENCOUNTER — Other Ambulatory Visit: Payer: Self-pay | Admitting: Oncology

## 2014-08-31 ENCOUNTER — Encounter: Payer: Self-pay | Admitting: Oncology

## 2014-08-31 VITALS — BP 148/73 | HR 94 | Temp 98.4°F | Ht 68.0 in | Wt 170.7 lb

## 2014-08-31 DIAGNOSIS — C9211 Chronic myeloid leukemia, BCR/ABL-positive, in remission: Secondary | ICD-10-CM

## 2014-08-31 NOTE — Patient Instructions (Signed)
Change routine lab to every 3 months at the cancer center next October 17, 2014 Molecular markers every 6 months next Sept 20 Visit with Dr Darnell Level  In October

## 2014-08-31 NOTE — Progress Notes (Signed)
Patient ID: Patrick Nash, male   DOB: Jul 22, 1960, 54 y.o.   MRN: [id number] Hematology and Oncology Follow Up Visit  Patrick Nash [id number] 1960-08-22 54 y.o. 08/31/2014 7:07 PM   Principle Diagnosis: Encounter Diagnosis  Name Primary?  . 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 . He has now been on [place] for over 10 years and remains in a molecular remission. 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 He was initially treated with Gleevec at 400 mg daily, subsequently escalated to 800 mg, and then over time, decreased to current dose of 400 mg. He achieved a rapid initial major molecular response. He has been able to maintain this over time despite dose reduction. Most recent BCR-ABL analysis done on 07/19/14 continues to show molecular remission.    Interim History:  He has had no interim medical problems. He did have a change in his insurance and is now required to have a primary care physician to continue to see me.  Medications:  Current outpatient prescriptions:  .  aspirin EC 325 MG tablet, Take 325 mg by mouth as needed., Disp: , Rfl:  .  GLEEVEC 400 MG tablet, TAKE ONE TABLET BY MOUTH DAILY. TAKE WITH MEALS AND LARGE GLASS OF WATER., Disp: 30 tablet, Rfl: PRN .  loratadine (CLARITIN) 10 MG tablet, Take 10 mg by mouth daily as needed. , Disp: , Rfl:   Allergies: No Known Allergies  Review of Systems: See HPI Remaining ROS negative:   Physical Exam: Blood pressure 148/73, pulse 94, temperature 98.4 F (36.9 C), temperature source Oral, height 5\' 8"  (1.727 m), weight 170 lb 11.2 oz (77.429 kg), SpO2 100 %. Wt Readings from Last 3 Encounters:  08/31/14 170 lb 11.2 oz (77.429 kg)  02/15/14 172 lb 8 oz (78.245 kg)  07/15/13 173 lb 8 oz (78.699 kg)     General  appearance: pale caucasian man HENNT: Pharynx no erythema, exudate, mass, or ulcer. No thyromegaly or thyroid nodules Lymph nodes: No cervical, supraclavicular, or axillary lymphadenopathy Breasts:  Lungs: Clear to auscultation, resonant to percussion throughout Heart: Regular rhythm, no murmur, no gallop, no rub, no click, no edema Abdomen: Soft, nontender, normal bowel sounds, no mass, no organomegaly Extremities: No edema, no calf tenderness Musculoskeletal: no joint deformities GU:  Vascular: Carotid pulses 2+, no bruits, distal pulses: Dorsalis pedis 1+ symmetric Neurologic: Alert, oriented, PERRLA, cranial nerves grossly normal, motor strength 5 over 5, reflexes 1+ symmetric, upper body coordination normal, gait normal, Skin: No rash or ecchymosis  Lab Results: CBC W/Diff    Component Value Date/Time   WBC 5.8 07/19/2014 1303   RBC 3.90* 07/19/2014 1303   HGB 11.9* 07/19/2014 1303   HCT 36.1* 07/19/2014 1303   PLT 182 07/19/2014 1303   MCV 92.6 07/19/2014 1303   MCH 30.5 07/19/2014 1303   MCH 31.6 05/30/2010 1301   MCHC 33.0 07/19/2014 1303   RDW 12.9 07/19/2014 1303   LYMPHSABS 1.6 07/19/2014 1303   MONOABS 0.6 07/19/2014 1303   EOSABS 0.5 07/19/2014 1303   BASOSABS 0.0 07/19/2014 1303     Chemistry      Component Value Date/Time   NA 142 07/19/2014 1306   NA 140 11/30/2011 1112   K 4.0 07/19/2014 1306   K 4.2 11/30/2011 1112   CL 103 09/12/2012 1119   CL 104 11/30/2011  1112   CO2 26 07/19/2014 1306   CO2 29 11/30/2011 1112   BUN 17.3 07/19/2014 1306   BUN 18 11/30/2011 1112   CREATININE 1.2 07/19/2014 1306   CREATININE 1.27 11/30/2011 1112      Component Value Date/Time   CALCIUM 8.7 07/19/2014 1306   CALCIUM 9.3 11/30/2011 1112   ALKPHOS 60 07/19/2014 1306   ALKPHOS 61 11/30/2011 1112   AST 16 07/19/2014 1306   AST 19 11/30/2011 1112   ALT 14 07/19/2014 1306   ALT 15 11/30/2011 1112   BILITOT 0.43 07/19/2014 1306   BILITOT 0.4 11/30/2011 1112       Impression:  #1. Chronic myeloid leukemia He remains in a long-term molecular remission on single agent imatinib now out 11-1/2 years. I feel comfortable decreasing frequency of routine labs to every 3 months and molecular markers to every 6 months at this time.   CC: Patient Care Team: No Pcp Per Patient as PCP - General (General Practice)   Annia Belt, MD 5/3/20167:07 PM

## 2014-09-01 ENCOUNTER — Telehealth: Payer: Self-pay | Admitting: Oncology

## 2014-09-01 NOTE — Telephone Encounter (Signed)
Lft msg for pt confirming labs per 05/02 POF, pt is to call back to confirm or change. I'm mailing out schedule and sending msg through my chart.... KJ

## 2014-09-03 ENCOUNTER — Telehealth: Payer: Self-pay | Admitting: Oncology

## 2014-09-03 NOTE — Telephone Encounter (Signed)
pt called to sched appt....done....pt ok adna ware

## 2014-09-13 ENCOUNTER — Other Ambulatory Visit: Payer: 59

## 2014-10-11 ENCOUNTER — Other Ambulatory Visit (HOSPITAL_BASED_OUTPATIENT_CLINIC_OR_DEPARTMENT_OTHER): Payer: 59

## 2014-10-11 DIAGNOSIS — C9211 Chronic myeloid leukemia, BCR/ABL-positive, in remission: Secondary | ICD-10-CM

## 2014-10-11 LAB — COMPREHENSIVE METABOLIC PANEL (CC13)
ALT: 13 U/L (ref 0–55)
AST: 18 U/L (ref 5–34)
Albumin: 4.1 g/dL (ref 3.5–5.0)
Alkaline Phosphatase: 58 U/L (ref 40–150)
Anion Gap: 7 mEq/L (ref 3–11)
BUN: 16 mg/dL (ref 7.0–26.0)
CO2: 25 mEq/L (ref 22–29)
CREATININE: 1.3 mg/dL (ref 0.7–1.3)
Calcium: 8.8 mg/dL (ref 8.4–10.4)
Chloride: 107 mEq/L (ref 98–109)
EGFR: 64 mL/min/{1.73_m2} — ABNORMAL LOW (ref >90)
Glucose: 102 mg/dl (ref 70–140)
POTASSIUM: 3.8 meq/L (ref 3.5–5.1)
Sodium: 139 mEq/L (ref 136–145)
Total Bilirubin: 0.57 mg/dL (ref 0.20–1.20)
Total Protein: 6.6 g/dL (ref 6.4–8.3)

## 2014-10-11 LAB — CBC WITH DIFFERENTIAL/PLATELET
BASO%: 0.5 % (ref 0.0–2.0)
Basophils Absolute: 0 10*3/uL (ref 0.0–0.1)
EOS%: 8.5 % — AB (ref 0.0–7.0)
Eosinophils Absolute: 0.5 10*3/uL (ref 0.0–0.5)
HCT: 36.8 % — ABNORMAL LOW (ref 38.4–49.9)
HGB: 12.1 g/dL — ABNORMAL LOW (ref 13.0–17.1)
LYMPH%: 23.5 % (ref 14.0–49.0)
MCH: 30.3 pg (ref 27.2–33.4)
MCHC: 32.9 g/dL (ref 32.0–36.0)
MCV: 92.2 fL (ref 79.3–98.0)
MONO#: 0.5 10*3/uL (ref 0.1–0.9)
MONO%: 9 % (ref 0.0–14.0)
NEUT#: 3.2 10*3/uL (ref 1.5–6.5)
NEUT%: 58.5 % (ref 39.0–75.0)
PLATELETS: 194 10*3/uL (ref 140–400)
RBC: 3.99 10*6/uL — AB (ref 4.20–5.82)
RDW: 12.7 % (ref 11.0–14.6)
WBC: 5.5 10*3/uL (ref 4.0–10.3)
lymph#: 1.3 10*3/uL (ref 0.9–3.3)

## 2014-10-13 ENCOUNTER — Telehealth: Payer: Self-pay | Admitting: *Deleted

## 2014-10-13 NOTE — Telephone Encounter (Signed)
-----   Message from Annia Belt, MD sent at 10/12/2014  7:35 AM EDT ----- Call: lab stable

## 2014-10-13 NOTE — Telephone Encounter (Signed)
Pt called - no answer; left message labs are stable per Dr Beryle Beams and to call if he has any questions.

## 2015-01-10 ENCOUNTER — Other Ambulatory Visit (HOSPITAL_BASED_OUTPATIENT_CLINIC_OR_DEPARTMENT_OTHER): Payer: 59

## 2015-01-10 DIAGNOSIS — C9211 Chronic myeloid leukemia, BCR/ABL-positive, in remission: Secondary | ICD-10-CM | POA: Diagnosis not present

## 2015-01-10 LAB — CBC WITH DIFFERENTIAL/PLATELET
BASO%: 1.5 % (ref 0.0–2.0)
Basophils Absolute: 0.1 10*3/uL (ref 0.0–0.1)
EOS%: 14.9 % — AB (ref 0.0–7.0)
Eosinophils Absolute: 0.8 10*3/uL — ABNORMAL HIGH (ref 0.0–0.5)
HEMATOCRIT: 35.8 % — AB (ref 38.4–49.9)
HGB: 11.8 g/dL — ABNORMAL LOW (ref 13.0–17.1)
LYMPH%: 24.2 % (ref 14.0–49.0)
MCH: 30.1 pg (ref 27.2–33.4)
MCHC: 32.9 g/dL (ref 32.0–36.0)
MCV: 91.3 fL (ref 79.3–98.0)
MONO#: 0.5 10*3/uL (ref 0.1–0.9)
MONO%: 9.7 % (ref 0.0–14.0)
NEUT#: 2.6 10*3/uL (ref 1.5–6.5)
NEUT%: 49.7 % (ref 39.0–75.0)
Platelets: 183 10*3/uL (ref 140–400)
RBC: 3.92 10*6/uL — ABNORMAL LOW (ref 4.20–5.82)
RDW: 12.8 % (ref 11.0–14.6)
WBC: 5.3 10*3/uL (ref 4.0–10.3)
lymph#: 1.3 10*3/uL (ref 0.9–3.3)

## 2015-01-10 LAB — COMPREHENSIVE METABOLIC PANEL (CC13)
ALT: 17 U/L (ref 0–55)
AST: 21 U/L (ref 5–34)
Albumin: 3.9 g/dL (ref 3.5–5.0)
Alkaline Phosphatase: 68 U/L (ref 40–150)
Anion Gap: 4 mEq/L (ref 3–11)
BUN: 14.8 mg/dL (ref 7.0–26.0)
CO2: 30 mEq/L — ABNORMAL HIGH (ref 22–29)
CREATININE: 1.2 mg/dL (ref 0.7–1.3)
Calcium: 8.8 mg/dL (ref 8.4–10.4)
Chloride: 107 mEq/L (ref 98–109)
EGFR: 68 mL/min/{1.73_m2} — ABNORMAL LOW (ref >90)
GLUCOSE: 97 mg/dL (ref 70–140)
Potassium: 4.5 mEq/L (ref 3.5–5.1)
SODIUM: 141 meq/L (ref 136–145)
Total Bilirubin: 0.35 mg/dL (ref 0.20–1.20)
Total Protein: 6.3 g/dL — ABNORMAL LOW (ref 6.4–8.3)

## 2015-01-19 ENCOUNTER — Telehealth: Payer: Self-pay | Admitting: *Deleted

## 2015-01-19 LAB — BCR/ABL (LIO MMD)

## 2015-01-19 NOTE — Telephone Encounter (Signed)
Pt called / informed leukemia marker remains low per Dr Beryle Beams. Requested actual # - [phone]% and ABL-Copies [id number] and BCR-ABL Copies #2.

## 2015-01-19 NOTE — Telephone Encounter (Signed)
-----   Message from Annia Belt, MD sent at 01/18/2015  4:50 PM EDT ----- Call pt: leukemia marker remains low (bcr-abl)

## 2015-01-31 ENCOUNTER — Encounter: Payer: Self-pay | Admitting: Oncology

## 2015-01-31 ENCOUNTER — Ambulatory Visit (INDEPENDENT_AMBULATORY_CARE_PROVIDER_SITE_OTHER): Payer: 59 | Admitting: Oncology

## 2015-01-31 ENCOUNTER — Telehealth: Payer: Self-pay | Admitting: *Deleted

## 2015-01-31 VITALS — BP 148/79 | HR 88 | Temp 98.2°F | Ht 68.0 in | Wt 175.7 lb

## 2015-01-31 DIAGNOSIS — C9211 Chronic myeloid leukemia, BCR/ABL-positive, in remission: Secondary | ICD-10-CM | POA: Diagnosis not present

## 2015-01-31 DIAGNOSIS — I10 Essential (primary) hypertension: Secondary | ICD-10-CM | POA: Diagnosis not present

## 2015-01-31 DIAGNOSIS — Z0000 Encounter for general adult medical examination without abnormal findings: Secondary | ICD-10-CM

## 2015-01-31 NOTE — Progress Notes (Signed)
Patient ID: Valerie Fredin, male   DOB: 03/01/1961, 54 y.o.   MRN: [id number] Hematology and Oncology Follow Up Visit  Raahim Shartzer [id number] June 20, 1960 54 y.o. 01/31/2015 11:27 AM :   Principle Diagnosis: Encounter Diagnoses  Name Primary?  . CML in remission ([place]) Yes  . 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 . He has now been on [place] for almost 12 years and remains in a molecular remission. 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 He was initially treated with Gleevec at 400 mg daily, subsequently escalated to 800 mg, and then over time, decreased to current dose of 400 mg. He achieved a rapid initial major molecular response. He has been able to maintain this over time despite dose reduction. Most recent BCR-ABL analysis done on 01/10/2015 continues to show molecular remission.   Interim History:   He has had no interim medical problems. No side effects that he can attribute to the [place].     Medications: reviewed  Allergies: No Known Allergies  Review of Systems: See history of present illness Remaining ROS negative:   Physical Exam: Blood pressure 148/79, pulse 88, temperature 98.2 F (36.8 C), temperature source Oral, height 5\' 8"  (1.727 m), weight 175 lb 11.2 oz (79.697 kg), SpO2 99 %. Wt Readings from Last 3 Encounters:  01/31/15 175 lb 11.2 oz (79.697 kg)  08/31/14 170 lb 11.2 oz (77.429 kg)  02/15/14 172 lb 8 oz (78.245 kg)     General appearance: Pale, adequately nourished Caucasian man HENNT: Male pattern baldness, Pharynx no erythema, exudate, mass, or ulcer. No thyromegaly or thyroid nodules Lymph nodes: No cervical, supraclavicular, or axillary lymphadenopathy Breasts:  Lungs: Clear to auscultation, resonant to percussion throughout Heart:  Regular rhythm, no murmur, no gallop, no rub, no click, no edema Abdomen: Soft, nontender, normal bowel sounds, no mass, no organomegaly Extremities: No edema, no calf tenderness Musculoskeletal: no joint deformities GU:  Vascular: Carotid pulses 2+, no bruits,  Neurologic: Alert, oriented, PERRLA, , cranial nerves grossly normal, motor strength 5 over 5, reflexes 1+ symmetric, upper body coordination normal, gait normal, Skin: No rash or ecchymosis  Lab Results: CBC W/Diff    Component Value Date/Time   WBC 5.3 01/10/2015 1316   RBC 3.92* 01/10/2015 1316   HGB 11.8* 01/10/2015 1316   HCT 35.8* 01/10/2015 1316   PLT 183 01/10/2015 1316   MCV 91.3 01/10/2015 1316   MCH 30.1 01/10/2015 1316   MCH 31.6 05/30/2010 1301   MCHC 32.9 01/10/2015 1316   RDW 12.8 01/10/2015 1316   LYMPHSABS 1.3 01/10/2015 1316   MONOABS 0.5 01/10/2015 1316   EOSABS 0.8* 01/10/2015 1316   BASOSABS 0.1 01/10/2015 1316     Chemistry      Component Value Date/Time   NA 141 01/10/2015 1316   NA 140 11/30/2011 1112   K 4.5 01/10/2015 1316   K 4.2 11/30/2011 1112   CL 103 09/12/2012 1119   CL 104 11/30/2011 1112   CO2 30* 01/10/2015 1316   CO2 29 11/30/2011 1112   BUN 14.8 01/10/2015 1316   BUN 18 11/30/2011 1112   CREATININE 1.2 01/10/2015 1316   CREATININE 1.27 11/30/2011 1112      Component Value Date/Time   CALCIUM 8.8 01/10/2015 1316   CALCIUM 9.3 11/30/2011 1112   ALKPHOS 68 01/10/2015 1316  ALKPHOS 61 11/30/2011 1112   AST 21 01/10/2015 1316   AST 19 11/30/2011 1112   ALT 17 01/10/2015 1316   ALT 15 11/30/2011 1112   BILITOT 0.35 01/10/2015 1316   BILITOT 0.4 11/30/2011 1112       Radiological Studies: No results found.  Impression:  #1. CML He remains in a long-term molecular remission. We will continue every 3 month routine lab monitoring and every 6 month bcr-abl transcript monitoring. We discussed the available data that shows that about one third of patients who have been  in molecular remission for over 5 years will maintain that remission when imatinib therapy is stopped. Of those who progress, they do so within the first 2 years of stopping the drug. They do not show resistance to resuming the drug. This is a possible option for him at this point in time. I will be going to the annual hematology meetings and get updates on these data. For the time being we will continue imatinib 400 mg daily.  #2. Borderline essential hypertension. We discussed this today. He will try to go on a low salt diet. Reevaluate in 6 months for medical therapy. He is not overweight.  #3. Health maintenance His only colonoscopy was done 10 years ago at aged 68 when he was living in [place]. I will arrange for a GI referral for routine follow-up now age 46.    CC: Patient Care Team: No Pcp Per Patient as PCP - General (General Practice)   Annia Belt, MD 10/3/201611:27 AM

## 2015-01-31 NOTE — Patient Instructions (Signed)
Continue every 3 month labs: see standing orders Bcr-abl analysis in 6 months MD visit in 6 months 2-3 weeks after bcr-abl results Referral to [facility] for routine colonoscopy

## 2015-01-31 NOTE — Telephone Encounter (Signed)
CALLED PATIENT LVM FOR PATIENT TO RETURN CALL. NEED TO MAKE GI REFERRAL FOR PATIENT.

## 2015-03-02 NOTE — Addendum Note (Signed)
Addended by: Hulan Fray on: 03/02/2015 05:50 PM   Modules accepted: Orders

## 2015-04-04 ENCOUNTER — Telehealth: Payer: Self-pay | Admitting: Oncology

## 2015-04-04 ENCOUNTER — Other Ambulatory Visit (HOSPITAL_BASED_OUTPATIENT_CLINIC_OR_DEPARTMENT_OTHER): Payer: 59

## 2015-04-04 DIAGNOSIS — C9211 Chronic myeloid leukemia, BCR/ABL-positive, in remission: Secondary | ICD-10-CM

## 2015-04-04 LAB — COMPREHENSIVE METABOLIC PANEL
ALT: 12 U/L (ref 0–55)
ANION GAP: 8 meq/L (ref 3–11)
AST: 23 U/L (ref 5–34)
Albumin: 4.3 g/dL (ref 3.5–5.0)
Alkaline Phosphatase: 71 U/L (ref 40–150)
BUN: 15.6 mg/dL (ref 7.0–26.0)
CALCIUM: 9.1 mg/dL (ref 8.4–10.4)
CHLORIDE: 105 meq/L (ref 98–109)
CO2: 25 meq/L (ref 22–29)
Creatinine: 1.4 mg/dL — ABNORMAL HIGH (ref 0.7–1.3)
EGFR: 58 mL/min/{1.73_m2} — AB (ref >90)
Glucose: 99 mg/dl (ref 70–140)
POTASSIUM: 3.9 meq/L (ref 3.5–5.1)
Sodium: 138 mEq/L (ref 136–145)
Total Bilirubin: 0.65 mg/dL (ref 0.20–1.20)
Total Protein: 7 g/dL (ref 6.4–8.3)

## 2015-04-04 LAB — CBC WITH DIFFERENTIAL/PLATELET
BASO%: 1.7 % (ref 0.0–2.0)
BASOS ABS: 0.1 10*3/uL (ref 0.0–0.1)
EOS%: 15.3 % — ABNORMAL HIGH (ref 0.0–7.0)
Eosinophils Absolute: 0.8 10*3/uL — ABNORMAL HIGH (ref 0.0–0.5)
HEMATOCRIT: 37.9 % — AB (ref 38.4–49.9)
HGB: 12.5 g/dL — ABNORMAL LOW (ref 13.0–17.1)
LYMPH#: 1.6 10*3/uL (ref 0.9–3.3)
LYMPH%: 30.2 % (ref 14.0–49.0)
MCH: 30.1 pg (ref 27.2–33.4)
MCHC: 33 g/dL (ref 32.0–36.0)
MCV: 91.1 fL (ref 79.3–98.0)
MONO#: 0.4 10*3/uL (ref 0.1–0.9)
MONO%: 8 % (ref 0.0–14.0)
NEUT#: 2.4 10*3/uL (ref 1.5–6.5)
NEUT%: 44.8 % (ref 39.0–75.0)
PLATELETS: 175 10*3/uL (ref 140–400)
RBC: 4.15 10*6/uL — AB (ref 4.20–5.82)
RDW: 12.8 % (ref 11.0–14.6)
WBC: 5.4 10*3/uL (ref 4.0–10.3)

## 2015-04-04 NOTE — Telephone Encounter (Signed)
Lt mess regarding [employer] aut[employer].

## 2015-04-08 ENCOUNTER — Telehealth: Payer: Self-pay | Admitting: *Deleted

## 2015-04-08 NOTE — Telephone Encounter (Signed)
-----   Message from Annia Belt, MD sent at 04/06/2015 11:46 AM EST ----- Call pt lab stable

## 2015-04-08 NOTE — Telephone Encounter (Signed)
Pt called - no answer; left message labs are stable per Dr Beryle Beams. ANd to call if he has any questions.

## 2015-05-19 ENCOUNTER — Other Ambulatory Visit: Payer: Self-pay | Admitting: *Deleted

## 2015-05-20 MED ORDER — IMATINIB MESYLATE 400 MG PO TABS: ORAL_TABLET | ORAL | Status: DC

## 2015-06-09 ENCOUNTER — Telehealth: Payer: Self-pay | Admitting: Oncology

## 2015-06-09 NOTE — Telephone Encounter (Signed)
[place] patient. Returned patient call re needing lab in our office for March. Order in [facility] for 3/13 lab - no communication sent to [facility] scheduling. Gave patient lab appointment for 3/20 - date per patient. Patient also had questions re labs and what was to be drawn as he wanted to make sure the specific labs he needed was ordered - patient instructed to contact JG's office at [facility].

## 2015-06-10 ENCOUNTER — Telehealth: Payer: Self-pay | Admitting: Oncology

## 2015-06-10 NOTE — Telephone Encounter (Signed)
Patient called to have 4/20 lab moved to 3/20 - lab should be 3/20 per pt request. Patient had corrected date/time.

## 2015-07-18 ENCOUNTER — Other Ambulatory Visit (HOSPITAL_BASED_OUTPATIENT_CLINIC_OR_DEPARTMENT_OTHER): Payer: BLUE CROSS/BLUE SHIELD

## 2015-07-18 DIAGNOSIS — C9211 Chronic myeloid leukemia, BCR/ABL-positive, in remission: Secondary | ICD-10-CM

## 2015-07-18 LAB — CBC WITH DIFFERENTIAL/PLATELET
BASO%: 0.7 % (ref 0.0–2.0)
BASOS ABS: 0 10*3/uL (ref 0.0–0.1)
EOS%: 15.3 % — AB (ref 0.0–7.0)
Eosinophils Absolute: 0.9 10*3/uL — ABNORMAL HIGH (ref 0.0–0.5)
HCT: 36.1 % — ABNORMAL LOW (ref 38.4–49.9)
HEMOGLOBIN: 11.8 g/dL — AB (ref 13.0–17.1)
LYMPH%: 25.8 % (ref 14.0–49.0)
MCH: 30.5 pg (ref 27.2–33.4)
MCHC: 32.7 g/dL (ref 32.0–36.0)
MCV: 93.3 fL (ref 79.3–98.0)
MONO#: 0.6 10*3/uL (ref 0.1–0.9)
MONO%: 10.1 % (ref 0.0–14.0)
NEUT#: 2.9 10*3/uL (ref 1.5–6.5)
NEUT%: 48.1 % (ref 39.0–75.0)
Platelets: 163 10*3/uL (ref 140–400)
RBC: 3.87 10*6/uL — ABNORMAL LOW (ref 4.20–5.82)
RDW: 13.1 % (ref 11.0–14.6)
WBC: 5.9 10*3/uL (ref 4.0–10.3)
lymph#: 1.5 10*3/uL (ref 0.9–3.3)

## 2015-07-18 LAB — COMPREHENSIVE METABOLIC PANEL
ALT: 22 U/L (ref 0–55)
AST: 24 U/L (ref 5–34)
Albumin: 3.9 g/dL (ref 3.5–5.0)
Alkaline Phosphatase: 68 U/L (ref 40–150)
Anion Gap: 5 mEq/L (ref 3–11)
BUN: 18.7 mg/dL (ref 7.0–26.0)
CHLORIDE: 108 meq/L (ref 98–109)
CO2: 30 meq/L — AB (ref 22–29)
Calcium: 8.9 mg/dL (ref 8.4–10.4)
Creatinine: 1.2 mg/dL (ref 0.7–1.3)
EGFR: 67 mL/min/{1.73_m2} — ABNORMAL LOW (ref >90)
GLUCOSE: 98 mg/dL (ref 70–140)
POTASSIUM: 4.4 meq/L (ref 3.5–5.1)
SODIUM: 143 meq/L (ref 136–145)
Total Bilirubin: 0.39 mg/dL (ref 0.20–1.20)
Total Protein: 6.5 g/dL (ref 6.4–8.3)

## 2015-08-08 LAB — BCR/ABL (LIO MMD)

## 2015-08-15 ENCOUNTER — Ambulatory Visit (INDEPENDENT_AMBULATORY_CARE_PROVIDER_SITE_OTHER): Payer: BLUE CROSS/BLUE SHIELD | Admitting: Oncology

## 2015-08-15 ENCOUNTER — Ambulatory Visit (HOSPITAL_COMMUNITY)
Admission: RE | Admit: 2015-08-15 | Discharge: 2015-08-15 | Disposition: A | Discharge status: Home / Self Care | Payer: BLUE CROSS/BLUE SHIELD | Source: Ambulatory Visit | Attending: Oncology | Admitting: Oncology

## 2015-08-15 ENCOUNTER — Other Ambulatory Visit: Payer: Self-pay | Admitting: Oncology

## 2015-08-15 ENCOUNTER — Other Ambulatory Visit (HOSPITAL_COMMUNITY): Payer: BLUE CROSS/BLUE SHIELD

## 2015-08-15 VITALS — BP 141/75 | HR 76 | Temp 98.2°F | Ht 68.0 in | Wt 162.0 lb

## 2015-08-15 DIAGNOSIS — R55 Syncope and collapse: Secondary | ICD-10-CM | POA: Insufficient documentation

## 2015-08-15 DIAGNOSIS — C9211 Chronic myeloid leukemia, BCR/ABL-positive, in remission: Secondary | ICD-10-CM | POA: Diagnosis not present

## 2015-08-15 DIAGNOSIS — I951 Orthostatic hypotension: Secondary | ICD-10-CM | POA: Insufficient documentation

## 2015-08-15 DIAGNOSIS — I10 Essential (primary) hypertension: Secondary | ICD-10-CM

## 2015-08-15 DIAGNOSIS — Z8679 Personal history of other diseases of the circulatory system: Secondary | ICD-10-CM | POA: Diagnosis not present

## 2015-08-15 NOTE — Patient Instructions (Signed)
Stop imatinib for now Lab every 3 months at the [facility] to resume on Monday October 17, 2015  MD visit 4 months

## 2015-08-15 NOTE — Progress Notes (Signed)
Patient ID: Patrick Nash, male   DOB: 09/29/1960, 55 y.o.   MRN: [id number] Hematology and Oncology Follow Up Visit  Patrick Nash [id number] 03/23/1961 55 y.o. 08/15/2015 5:49 PM   Principle Diagnosis: Encounter Diagnoses  Name Primary?  . 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 . He has now been on [place] for almost 12 years and remains in a molecular remission. 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 He was initially treated with Gleevec at 400 mg daily, subsequently escalated to 800 mg, and then over time, decreased to current dose of 400 mg. He achieved a rapid initial major molecular response. He has been able to maintain this over time despite dose reduction. Most recent BCR-ABL analysis done on 4/17/17continues to show molecular remission. In fact, BCR-ABL no longer detectable.  Interim History:  A few weeks ago on March 16, he get up from bed to urinate. He was standing in front of the commode and felt lightheaded. He sat down but then passed out, fell to the floor, hit his head. He denied any chest pain dyspnea or palpitations. He was trying to get his blood pressure lower. He is trying to eat a healthier diet. He states he lost 10 pounds. His main exercise is walking briskly back-and-forth in his house. Blood pressure today a little better than last 2 visits at 141/75. Weight 162 pounds compared with 176 back in October 2016. He is under a lot of stress. 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 She just lost her sister and one of her best friends. His father who is a retired Engineer, drilling here in [place] was concerned that his son was becoming depressed. He was on medication for this in the past which helped. I asked [name] if he felt that  his mood was depressed. He said he thought it was just a reaction to stress and did not feel that he needed to be back on medication at this time.  Medications: reviewed  Allergies: No Known Allergies  Review of Systems: See interim history Remaining ROS negative:   Physical Exam: Blood pressure 141/75, pulse 76, temperature 98.2 F (36.8 C), temperature source Oral, height 5\' 8"  (1.727 m), weight 162 lb (73.483 kg), SpO2 100 %. Wt Readings from Last 3 Encounters:  08/15/15 162 lb (73.483 kg)  01/31/15 175 lb 11.2 oz (79.697 kg)  08/31/14 170 lb 11.2 oz (77.429 kg)  Orthostatic vitals: Supine blood pressure 149/79, pulse 66 Sitting blood pressure 143/74, pulse 62 Standing for 3 minutes blood pressure 153/81, pulse 74   General appearance: Pale, adequately nourished, Caucasian man HENNT: Pharynx no erythema, exudate, mass, or ulcer. No thyromegaly or thyroid nodules Lymph nodes: No cervical, supraclavicular, or axillary lymphadenopathy Breasts: Lungs: Clear to auscultation, resonant to percussion throughout Heart: Regular rhythm, no murmur, no gallop, no rub, no click, no edema Abdomen: Soft, nontender, normal bowel sounds, no mass, no organomegaly Extremities: No edema, no calf tenderness Musculoskeletal: no joint deformities GU:  Vascular: Carotid pulses 2+, no bruits, distal pulses:  Neurologic: Alert, oriented, PERRLA, I could not get a good look at his fundi, cranial nerves grossly normal, motor strength 5 over 5, reflexes 1+ symmetric, upper body coordination normal, gait normal, Skin: No rash or ecchymosis  Lab Results: CBC W/Diff    Component  Value Date/Time   WBC 5.9 07/18/2015 1122   RBC 3.87* 07/18/2015 1122   HGB 11.8* 07/18/2015 1122   HCT 36.1* 07/18/2015 1122   PLT 163 07/18/2015 1122   MCV 93.3 07/18/2015 1122   MCH 30.5 07/18/2015 1122   MCH 31.6 05/30/2010 1301   MCHC 32.7 07/18/2015 1122   RDW 13.1 07/18/2015 1122   LYMPHSABS 1.5 07/18/2015 1122    MONOABS 0.6 07/18/2015 1122   EOSABS 0.9* 07/18/2015 1122   BASOSABS 0.0 07/18/2015 1122     Chemistry      Component Value Date/Time   NA 143 07/18/2015 1122   NA 140 11/30/2011 1112   K 4.4 07/18/2015 1122   K 4.2 11/30/2011 1112   CL 103 09/12/2012 1119   CL 104 11/30/2011 1112   CO2 30* 07/18/2015 1122   CO2 29 11/30/2011 1112   BUN 18.7 07/18/2015 1122   BUN 18 11/30/2011 1112   CREATININE 1.2 07/18/2015 1122   CREATININE 1.27 11/30/2011 1112      Component Value Date/Time   CALCIUM 8.9 07/18/2015 1122   CALCIUM 9.3 11/30/2011 1112   ALKPHOS 68 07/18/2015 1122   ALKPHOS 61 11/30/2011 1112   AST 24 07/18/2015 1122   AST 19 11/30/2011 1112   ALT 22 07/18/2015 1122   ALT 15 11/30/2011 1112   BILITOT 0.39 07/18/2015 1122   BILITOT 0.4 11/30/2011 1112     EKG: Sinus rhythm. No arrhythmias. No QT prolongation.  Radiological Studies: No results found.  Impression:  #1. Chronic myeloid leukemia in remission [name] has been on imatinib for over 4500 days since diagnosis in December 2004. He has a deep molecular remission at greater than or equal to 4.5 logs below baseline. He is an excellent candidate to go on a drug holiday with the expectation based on recent data from clinical trials that he has about a 40% chance of staying in a long-term remission off therapy. If he does relapse, there does not appear to be a risk of becoming a drug resistant. We will start a drug holiday immediately. Continue to check routine lab and molecular markers every 3 months. Most of the relapses occur in the first year off therapy.  #2. Isolated syncopal episode Symptoms suggested orthostasis at the time of the syncope. He is currently not orthostatic. No gross arrhythmias on electrocardiogram. Normal cardiac and neurologic exams today. I'm comfortable just watching at this time.  #3. Borderline hypertension Stable to improved with deliberate weight loss and decrease salt intake. I will  defer any medication therapy at this time.  CC: Patient Care Team: No Pcp Per Patient as PCP - General (General Practice)   Annia Belt, MD 4/17/20175:49 PM

## 2015-08-16 ENCOUNTER — Telehealth: Payer: Self-pay | Admitting: Oncology

## 2015-08-16 NOTE — Telephone Encounter (Signed)
lvm for pt regarding to june thru 2018 appt....advised pt to pick up sched at visit

## 2015-08-18 ENCOUNTER — Other Ambulatory Visit: Payer: Self-pay

## 2015-10-17 ENCOUNTER — Other Ambulatory Visit (HOSPITAL_BASED_OUTPATIENT_CLINIC_OR_DEPARTMENT_OTHER): Payer: BLUE CROSS/BLUE SHIELD

## 2015-10-17 DIAGNOSIS — C9211 Chronic myeloid leukemia, BCR/ABL-positive, in remission: Secondary | ICD-10-CM

## 2015-10-17 LAB — CBC WITH DIFFERENTIAL/PLATELET
BASO%: 0.2 % (ref 0.0–2.0)
Basophils Absolute: 0 10*3/uL (ref 0.0–0.1)
EOS ABS: 0.5 10*3/uL (ref 0.0–0.5)
EOS%: 6.3 % (ref 0.0–7.0)
HEMATOCRIT: 39.6 % (ref 38.4–49.9)
HEMOGLOBIN: 13.1 g/dL (ref 13.0–17.1)
LYMPH#: 1.3 10*3/uL (ref 0.9–3.3)
LYMPH%: 15.2 % (ref 14.0–49.0)
MCH: 29 pg (ref 27.2–33.4)
MCHC: 33.1 g/dL (ref 32.0–36.0)
MCV: 87.8 fL (ref 79.3–98.0)
MONO#: 0.8 10*3/uL (ref 0.1–0.9)
MONO%: 10.1 % (ref 0.0–14.0)
NEUT%: 68.2 % (ref 39.0–75.0)
NEUTROS ABS: 5.6 10*3/uL (ref 1.5–6.5)
PLATELETS: 231 10*3/uL (ref 140–400)
RBC: 4.51 10*6/uL (ref 4.20–5.82)
RDW: 11.7 % (ref 11.0–14.6)
WBC: 8.2 10*3/uL (ref 4.0–10.3)

## 2015-10-17 LAB — COMPREHENSIVE METABOLIC PANEL
ALBUMIN: 3.9 g/dL (ref 3.5–5.0)
ALK PHOS: 79 U/L (ref 40–150)
ALT: 13 U/L (ref 0–55)
ANION GAP: 7 meq/L (ref 3–11)
AST: 20 U/L (ref 5–34)
BILIRUBIN TOTAL: 0.54 mg/dL (ref 0.20–1.20)
BUN: 19.7 mg/dL (ref 7.0–26.0)
CALCIUM: 9.9 mg/dL (ref 8.4–10.4)
CO2: 31 meq/L — AB (ref 22–29)
CREATININE: 1.2 mg/dL (ref 0.7–1.3)
Chloride: 103 mEq/L (ref 98–109)
EGFR: 66 mL/min/{1.73_m2} — AB (ref >90)
Glucose: 111 mg/dl (ref 70–140)
Potassium: 4.3 mEq/L (ref 3.5–5.1)
Sodium: 141 mEq/L (ref 136–145)
TOTAL PROTEIN: 7.1 g/dL (ref 6.4–8.3)

## 2015-10-17 LAB — LACTATE DEHYDROGENASE: LDH: 136 U/L (ref 125–245)

## 2015-11-23 ENCOUNTER — Telehealth: Payer: Self-pay | Admitting: *Deleted

## 2015-11-23 NOTE — Telephone Encounter (Signed)
Pt called and stated he's back on Imatinib. And needs a new rx sent to [facility].

## 2015-11-28 ENCOUNTER — Telehealth: Payer: Self-pay | Admitting: *Deleted

## 2015-11-28 NOTE — Telephone Encounter (Signed)
Pharmacy called [phone] option #3 to clarify script sent for imatinib- they need to know: 1) how many to dispense 2) does he take the med with a meal or not I do not see a new script in his medlist Please advise

## 2015-11-28 NOTE — Telephone Encounter (Signed)
Rx form completed by Dr Darnell Level then faxed . Pt called and informed.

## 2015-11-29 NOTE — Telephone Encounter (Signed)
Dr Beryle Beams has addressed this

## 2015-12-12 ENCOUNTER — Encounter: Payer: Self-pay | Admitting: Oncology

## 2015-12-12 ENCOUNTER — Ambulatory Visit (INDEPENDENT_AMBULATORY_CARE_PROVIDER_SITE_OTHER): Payer: BLUE CROSS/BLUE SHIELD | Admitting: Oncology

## 2015-12-12 VITALS — BP 154/72 | HR 71 | Temp 98.1°F | Ht 68.0 in | Wt 161.0 lb

## 2015-12-12 DIAGNOSIS — R1310 Dysphagia, unspecified: Secondary | ICD-10-CM | POA: Insufficient documentation

## 2015-12-12 DIAGNOSIS — C9211 Chronic myeloid leukemia, BCR/ABL-positive, in remission: Secondary | ICD-10-CM

## 2015-12-12 DIAGNOSIS — I10 Essential (primary) hypertension: Secondary | ICD-10-CM | POA: Insufficient documentation

## 2015-12-12 HISTORY — DX: Dysphagia, unspecified: R13.10

## 2015-12-12 HISTORY — DX: Essential (primary) hypertension: I10

## 2015-12-12 MED ORDER — TRIAMTERENE-HCTZ 37.5-25 MG PO CAPS: 1.0000 | ORAL_CAPSULE | Freq: Every day | ORAL | 6 refills | Status: DC

## 2015-12-12 NOTE — Patient Instructions (Addendum)
Schedule barium swallow at [place] every 3 month lab at the [facility]: next 01/09/16 Return visit with Dr Darnell Level in 4 months

## 2015-12-12 NOTE — Progress Notes (Signed)
Hematology and Oncology Follow Up Visit  Patrick Nash [id number] 07-19-1960 55 y.o. 12/12/2015 11:44 AM   Principle Diagnosis: Chronic myeloid leukemia in remission on treatment Encounter Diagnoses  Name Primary?  . HTN (hypertension), benign   . 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 . He has now been on [place] for almost 12 years and remains in a molecular remission. 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 He was initially treated with Gleevec at 400 mg daily, subsequently escalated to 800 mg, and then over time, decreased to current dose of 400 mg. He achieved a rapid initial major molecular response. He has been able to maintain this over time despite dose reduction. Most recent BCR-ABL analysis done on 4/17/17continues to show molecular remission. In fact, BCR-ABL no longer detectable. In view of recent data showing that about 50% of patients who have been in a long-term molecular remission will stay in remission if imatinib or other second generation tyrosine kinase inhibitors are stopped, I gave him a trial off drug subsequent to his April 2017 visit. A bcr-abl analysis done on 10/17/2015, it did show a rise in his transcript level of 3 logs although he was still at the 3 logs below baseline mark. Gleevec was resumed.  Interim History:   At time of his visit here in April, he reported a syncopal episode that occurred when he got up to go to the bathroom. He had no focal neurologic deficit on exam. Electrocardiogram showed sinus rhythm with no arrhythmia and no QT prolongation. He has had no subsequent events. He now reports repetitive episodes of dysphagia for solids. One choking episode when his father had to administer the Heimlich maneuver. Despite his attempts a low-salt diet and increase  exercise, blood pressure remains elevated.  Medications: reviewed  Allergies: No Known Allergies  Review of Systems: See interim history Remaining ROS negative:   Physical Exam: Blood pressure (!) 154/72, pulse 71, temperature 98.1 F (36.7 C), temperature source Oral, height 5\' 8"  (1.727 m), weight 161 lb (73 kg), SpO2 100 %. Wt Readings from Last 3 Encounters:  12/12/15 161 lb (73 kg)  08/15/15 162 lb (73.5 kg)  01/31/15 175 lb 11.2 oz (79.7 kg)     General appearance: Pale Caucasian man HENNT: Male pattern baldness. Pharynx no erythema, exudate, mass, or ulcer. No thyromegaly or thyroid nodules Lymph nodes: No cervical, supraclavicular, or axillary lymphadenopathy Breasts:  Lungs: Clear to auscultation, resonant to percussion throughout Heart: Regular rhythm, no murmur, no gallop, no rub, no click, no edema Abdomen: Soft, nontender, normal bowel sounds, no mass, no organomegaly Extremities: No edema, no calf tenderness Musculoskeletal: no joint deformities GU:  Vascular: Carotid pulses 2+, no bruits, distal pulses: Dorsalis pedis 1+ symmetric Neurologic: Alert, oriented, PERRLA, cranial nerves grossly normal, motor strength 5 over 5, reflexes 1+ symmetric, upper body coordination normal, gait normal, Skin: No rash or ecchymosis  Lab Results: CBC W/Diff    Component Value Date/Time   WBC 8.2 10/17/2015 1317   RBC 4.51 10/17/2015 1317   HGB 13.1 10/17/2015 1317   HCT 39.6 10/17/2015 1317   PLT 231 10/17/2015 1317   MCV 87.8 10/17/2015 1317   MCH 29.0 10/17/2015 1317   MCH 31.6 05/30/2010 1301   MCHC 33.1 10/17/2015 1317   RDW 11.7 10/17/2015 1317   LYMPHSABS 1.3 10/17/2015 1317   MONOABS 0.8  10/17/2015 1317   EOSABS 0.5 10/17/2015 1317   BASOSABS 0.0 10/17/2015 1317     Chemistry      Component Value Date/Time   NA 141 10/17/2015 1317   K 4.3 10/17/2015 1317   CL 103 09/12/2012 1119   CO2 31 (H) 10/17/2015 1317   BUN 19.7 10/17/2015 1317   CREATININE 1.2  10/17/2015 1317      Component Value Date/Time   CALCIUM 9.9 10/17/2015 1317   ALKPHOS 79 10/17/2015 1317   AST 20 10/17/2015 1317   ALT 13 10/17/2015 1317   BILITOT 0.54 10/17/2015 1317       Radiological Studies: No results found.  Impression:  #1. Chronic myeloid leukemia diagnosed in December 2004 Attempt at discontinuation of imatinib in April 2017 but the medication had to be resumed in June 2017 when bcr-abl and transcript began to rise. I will check molecular markers again in September. Clinical trial data shows that patients who did have rise in their transcript levels, did not become resistant to drug when it was resumed.  #2. Isolated syncopal episode with no recurrences  #3. Essential hypertension Attempt at diet unsuccessful. I'm going to start him on triamterene hydrochlorothiazide 37.5/25 mg 1 daily. Repeat lab in 1 month to assess electrolytes and potassium.  #4. Dysphagia for solid foods in a never smoker Reason unclear. I have scheduled a barium swallow study to rule out esophageal mass or stricture.   CC: Patient Care Team: No Pcp Per Patient as PCP - General (General Practice)   Annia Belt, MD 8/14/201711:44 AM

## 2016-01-09 ENCOUNTER — Other Ambulatory Visit (HOSPITAL_BASED_OUTPATIENT_CLINIC_OR_DEPARTMENT_OTHER): Payer: BLUE CROSS/BLUE SHIELD

## 2016-01-09 DIAGNOSIS — C9211 Chronic myeloid leukemia, BCR/ABL-positive, in remission: Secondary | ICD-10-CM

## 2016-01-09 LAB — CBC WITH DIFFERENTIAL/PLATELET
BASO%: 1.3 % (ref 0.0–2.0)
Basophils Absolute: 0.1 10*3/uL (ref 0.0–0.1)
EOS%: 16.2 % — ABNORMAL HIGH (ref 0.0–7.0)
Eosinophils Absolute: 0.9 10*3/uL — ABNORMAL HIGH (ref 0.0–0.5)
HCT: 37.5 % — ABNORMAL LOW (ref 38.4–49.9)
HGB: 12.4 g/dL — ABNORMAL LOW (ref 13.0–17.1)
LYMPH%: 27.3 % (ref 14.0–49.0)
MCH: 28 pg (ref 27.2–33.4)
MCHC: 33.1 g/dL (ref 32.0–36.0)
MCV: 84.6 fL (ref 79.3–98.0)
MONO#: 0.4 10*3/uL (ref 0.1–0.9)
MONO%: 7.8 % (ref 0.0–14.0)
NEUT#: 2.6 10*3/uL (ref 1.5–6.5)
NEUT%: 47.4 % (ref 39.0–75.0)
Platelets: 180 10*3/uL (ref 140–400)
RBC: 4.43 10*6/uL (ref 4.20–5.82)
RDW: 15.1 % — ABNORMAL HIGH (ref 11.0–14.6)
WBC: 5.5 10*3/uL (ref 4.0–10.3)
lymph#: 1.5 10*3/uL (ref 0.9–3.3)

## 2016-01-09 LAB — COMPREHENSIVE METABOLIC PANEL
ALT: 24 U/L (ref 0–55)
ANION GAP: 10 meq/L (ref 3–11)
AST: 34 U/L (ref 5–34)
Albumin: 4 g/dL (ref 3.5–5.0)
Alkaline Phosphatase: 90 U/L (ref 40–150)
BUN: 16.6 mg/dL (ref 7.0–26.0)
CALCIUM: 9.1 mg/dL (ref 8.4–10.4)
CHLORIDE: 101 meq/L (ref 98–109)
CO2: 29 meq/L (ref 22–29)
Creatinine: 1.4 mg/dL — ABNORMAL HIGH (ref 0.7–1.3)
EGFR: 54 mL/min/{1.73_m2} — ABNORMAL LOW (ref >90)
GLUCOSE: 99 mg/dL (ref 70–140)
POTASSIUM: 3.5 meq/L (ref 3.5–5.1)
Sodium: 140 mEq/L (ref 136–145)
TOTAL PROTEIN: 6.9 g/dL (ref 6.4–8.3)
Total Bilirubin: 0.49 mg/dL (ref 0.20–1.20)

## 2016-01-09 LAB — LACTATE DEHYDROGENASE: LDH: 206 U/L (ref 125–245)

## 2016-02-03 ENCOUNTER — Other Ambulatory Visit: Payer: Self-pay | Admitting: Oncology

## 2016-02-03 ENCOUNTER — Telehealth: Payer: Self-pay | Admitting: *Deleted

## 2016-02-03 DIAGNOSIS — H9191 Unspecified hearing loss, right ear: Secondary | ICD-10-CM

## 2016-02-03 NOTE — Telephone Encounter (Signed)
Call from pt - 1. States loss of hearing in right ear and is requesting an ENT referral; states he does not have a PCP. 2. Requesting BCR-ABL results; would like copy mail to him. Thanks

## 2016-02-03 NOTE — Telephone Encounter (Signed)
I will mail report from 6/19. The one drawn on 9/11, report is not back yet.

## 2016-02-03 NOTE — Telephone Encounter (Signed)
OK to send most recent results to him. I will refer him to Dr Radene Journey

## 2016-02-27 LAB — BCR/ABL (LIO MMD)

## 2016-03-21 ENCOUNTER — Other Ambulatory Visit: Payer: Self-pay | Admitting: *Deleted

## 2016-03-21 MED ORDER — TRIAMTERENE-HCTZ 37.5-25 MG PO CAPS: 1.0000 | ORAL_CAPSULE | Freq: Every day | ORAL | 3 refills | Status: DC

## 2016-03-21 NOTE — Telephone Encounter (Signed)
Requesting 90 days supply 

## 2016-04-02 ENCOUNTER — Other Ambulatory Visit (HOSPITAL_BASED_OUTPATIENT_CLINIC_OR_DEPARTMENT_OTHER): Payer: BLUE CROSS/BLUE SHIELD

## 2016-04-02 DIAGNOSIS — C9211 Chronic myeloid leukemia, BCR/ABL-positive, in remission: Secondary | ICD-10-CM | POA: Diagnosis not present

## 2016-04-02 LAB — CBC WITH DIFFERENTIAL/PLATELET
BASO%: 0.6 % (ref 0.0–2.0)
Basophils Absolute: 0 10*3/uL (ref 0.0–0.1)
EOS%: 21 % — AB (ref 0.0–7.0)
Eosinophils Absolute: 1.1 10*3/uL — ABNORMAL HIGH (ref 0.0–0.5)
HCT: 35.6 % — ABNORMAL LOW (ref 38.4–49.9)
HGB: 12 g/dL — ABNORMAL LOW (ref 13.0–17.1)
LYMPH%: 26.4 % (ref 14.0–49.0)
MCH: 30.9 pg (ref 27.2–33.4)
MCHC: 33.7 g/dL (ref 32.0–36.0)
MCV: 91.8 fL (ref 79.3–98.0)
MONO#: 0.5 10*3/uL (ref 0.1–0.9)
MONO%: 9.7 % (ref 0.0–14.0)
NEUT%: 42.3 % (ref 39.0–75.0)
NEUTROS ABS: 2.3 10*3/uL (ref 1.5–6.5)
Platelets: 177 10*3/uL (ref 140–400)
RBC: 3.88 10*6/uL — AB (ref 4.20–5.82)
RDW: 12.6 % (ref 11.0–14.6)
WBC: 5.4 10*3/uL (ref 4.0–10.3)
lymph#: 1.4 10*3/uL (ref 0.9–3.3)

## 2016-04-02 LAB — COMPREHENSIVE METABOLIC PANEL
ALT: 22 U/L (ref 0–55)
AST: 28 U/L (ref 5–34)
Albumin: 3.9 g/dL (ref 3.5–5.0)
Alkaline Phosphatase: 92 U/L (ref 40–150)
Anion Gap: 8 mEq/L (ref 3–11)
BILIRUBIN TOTAL: 0.59 mg/dL (ref 0.20–1.20)
BUN: 16.8 mg/dL (ref 7.0–26.0)
CO2: 30 meq/L — AB (ref 22–29)
Calcium: 9.4 mg/dL (ref 8.4–10.4)
Chloride: 103 mEq/L (ref 98–109)
Creatinine: 1.5 mg/dL — ABNORMAL HIGH (ref 0.7–1.3)
EGFR: 53 mL/min/{1.73_m2} — AB (ref >90)
GLUCOSE: 97 mg/dL (ref 70–140)
Potassium: 3.8 mEq/L (ref 3.5–5.1)
SODIUM: 140 meq/L (ref 136–145)
TOTAL PROTEIN: 6.8 g/dL (ref 6.4–8.3)

## 2016-04-02 LAB — LACTATE DEHYDROGENASE: LDH: 199 U/L (ref 125–245)

## 2016-05-04 LAB — BCR/ABL (LIO MMD)

## 2016-06-01 ENCOUNTER — Other Ambulatory Visit: Payer: BLUE CROSS/BLUE SHIELD

## 2016-06-01 ENCOUNTER — Telehealth: Payer: Self-pay | Admitting: *Deleted

## 2016-06-01 ENCOUNTER — Other Ambulatory Visit (HOSPITAL_BASED_OUTPATIENT_CLINIC_OR_DEPARTMENT_OTHER): Payer: BLUE CROSS/BLUE SHIELD

## 2016-06-01 DIAGNOSIS — C9211 Chronic myeloid leukemia, BCR/ABL-positive, in remission: Secondary | ICD-10-CM

## 2016-06-01 LAB — COMPREHENSIVE METABOLIC PANEL
ALBUMIN: 4.1 g/dL (ref 3.5–5.0)
ALK PHOS: 90 U/L (ref 40–150)
ALT: 18 U/L (ref 0–55)
AST: 23 U/L (ref 5–34)
Anion Gap: 8 mEq/L (ref 3–11)
BUN: 22.8 mg/dL (ref 7.0–26.0)
CALCIUM: 9.2 mg/dL (ref 8.4–10.4)
CHLORIDE: 101 meq/L (ref 98–109)
CO2: 31 mEq/L — ABNORMAL HIGH (ref 22–29)
CREATININE: 1.5 mg/dL — AB (ref 0.7–1.3)
EGFR: 53 mL/min/{1.73_m2} — ABNORMAL LOW (ref >90)
GLUCOSE: 85 mg/dL (ref 70–140)
Potassium: 3.5 mEq/L (ref 3.5–5.1)
Sodium: 140 mEq/L (ref 136–145)
Total Bilirubin: 0.47 mg/dL (ref 0.20–1.20)
Total Protein: 6.9 g/dL (ref 6.4–8.3)

## 2016-06-01 LAB — CBC WITH DIFFERENTIAL/PLATELET
BASO%: 1 % (ref 0.0–2.0)
BASOS ABS: 0.1 10*3/uL (ref 0.0–0.1)
EOS ABS: 1.1 10*3/uL — AB (ref 0.0–0.5)
EOS%: 20.5 % — ABNORMAL HIGH (ref 0.0–7.0)
HCT: 35 % — ABNORMAL LOW (ref 38.4–49.9)
HGB: 12.2 g/dL — ABNORMAL LOW (ref 13.0–17.1)
LYMPH%: 30.8 % (ref 14.0–49.0)
MCH: 30.6 pg (ref 27.2–33.4)
MCHC: 34.9 g/dL (ref 32.0–36.0)
MCV: 87.7 fL (ref 79.3–98.0)
MONO#: 0.5 10*3/uL (ref 0.1–0.9)
MONO%: 9.7 % (ref 0.0–14.0)
NEUT%: 38 % — AB (ref 39.0–75.0)
NEUTROS ABS: 2 10*3/uL (ref 1.5–6.5)
PLATELETS: 178 10*3/uL (ref 140–400)
RBC: 3.99 10*6/uL — ABNORMAL LOW (ref 4.20–5.82)
RDW: 12.4 % (ref 11.0–14.6)
WBC: 5.1 10*3/uL (ref 4.0–10.3)
lymph#: 1.6 10*3/uL (ref 0.9–3.3)

## 2016-06-01 NOTE — Telephone Encounter (Signed)
Pt called / informed "CBC stable; kidney function mildly abnormal but no change compared with 04/02/16. Will continue to monitor. " per Dr Beryle Beams. Stated thank - you for calling.

## 2016-06-01 NOTE — Telephone Encounter (Signed)
-----   Message from Annia Belt, MD sent at 06/01/2016  2:35 PM EST ----- Call pt: CBC stable; kidney function mildly abnormal but no change compared with 04/02/16.  Will continue to monitor.

## 2016-06-11 ENCOUNTER — Ambulatory Visit (INDEPENDENT_AMBULATORY_CARE_PROVIDER_SITE_OTHER): Payer: BLUE CROSS/BLUE SHIELD | Admitting: Oncology

## 2016-06-11 ENCOUNTER — Encounter: Payer: Self-pay | Admitting: Oncology

## 2016-06-11 VITALS — BP 134/73 | HR 78 | Temp 98.1°F | Ht 68.0 in | Wt 153.7 lb

## 2016-06-11 DIAGNOSIS — I10 Essential (primary) hypertension: Secondary | ICD-10-CM

## 2016-06-11 DIAGNOSIS — C9211 Chronic myeloid leukemia, BCR/ABL-positive, in remission: Secondary | ICD-10-CM | POA: Diagnosis not present

## 2016-06-11 DIAGNOSIS — Z79899 Other long term (current) drug therapy: Secondary | ICD-10-CM

## 2016-06-11 DIAGNOSIS — R7989 Other specified abnormal findings of blood chemistry: Secondary | ICD-10-CM | POA: Diagnosis not present

## 2016-06-11 MED ORDER — IMATINIB MESYLATE 400 MG PO TABS: 400.0000 mg | ORAL_TABLET | Freq: Every day | ORAL | 11 refills | Status: DC

## 2016-06-11 NOTE — Patient Instructions (Signed)
To lab today to pick up urine jug for 24 hour urine collection BMET when he returns with the urine collection Cancel outstanding labs at cancer center Routine labs here every 3 months begin 5/21 then 8/13, 03/04/17 Bcr-abl leukemia marker on 4/2 then 01/28/17 MD visit 6 months

## 2016-06-11 NOTE — Progress Notes (Signed)
Hematology and Oncology Follow Up Visit  Patrick Nash [id number] 05/20/1960 56 y.o. 06/11/2016 4:43 PM   Principle Diagnosis: Encounter Diagnoses  Name Primary?  . 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 HTN (hypertension), benign   . 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 . He has now been on [place] for almost 12 years and remains in a molecular remission. 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 He was initially treated with Gleevec at 400 mg daily, subsequently escalated to 800 mg, and then over time, decreased to current dose of 400 mg. He achieved a rapid initial major molecular response. He has been able to maintain this over time despite dose reduction.  In view of recent data showing that about 50% of patients who have been in a long-term molecular remission will stay in remission if imatinib or other second generation tyrosine kinase inhibitors are stopped, I gave him a trial off drug subsequent to his April 2017 visit. A bcr-abl analysis done on 10/17/2015, it did show a rise in his transcript level of 3 logs although he was still at the 3 logs below baseline mark. Gleevec was resumed.  Interim History:   He's had a number problems over the last few months. He initially complained of dysphagia. I ordered a barium swallow but his symptoms resolved spontaneously and he did not get the study done. He called in October complaining of decreased hearing. I referred him to Dr. Radene Journey. He had impacted cerumen which was removed and his hearing improved. He has had no other medical issues since that time. I did note a rise in his creatinine compared with his baseline. Looking back over 10 years of data with him today, his creatinine has been chronically elevated at 1.2-1.3. In September value  was 1.4, repeat value December 4 1.5, repeat on 06/01/2016 1.5. BUN which has been normal slightly elevated at 23 on February 2. He was started on a thiazide diuretic in November 2017 to control his blood pressure. He has been able to lose some weight and is down from 161 pounds in August 2017 to 153 pounds today. Blood pressure is 134/73.   Medications: reviewed  Allergies: No Known Allergies  Review of Systems: See interim history Remaining ROS negative:   Physical Exam: Blood pressure 134/73, pulse 78, temperature 98.1 F (36.7 C), temperature source Oral, height 5\' 8"  (1.727 m), weight 153 lb 11.2 oz (69.7 kg), SpO2 100 %. Wt Readings from Last 3 Encounters:  06/11/16 153 lb 11.2 oz (69.7 kg)  12/12/15 161 lb (73 kg)  08/15/15 162 lb (73.5 kg)     General appearance: Well nourished Caucasian man HENNT:Male pattern baldness. Pharynx no erythema, exudate, mass, or ulcer. No thyromegaly or thyroid nodules Lymph nodes: No cervical, supraclavicular, or axillary lymphadenopathy Breasts:  Lungs: Clear to auscultation, resonant to percussion throughout Heart: Regular rhythm, no murmur, no gallop, no rub, no click, no edema Abdomen: Soft, nontender, normal bowel sounds, no mass, no organomegaly Extremities: No edema, no calf tenderness Musculoskeletal: no joint deformities GU:  Vascular: Carotid pulses 2+, no bruits,  Neurologic: Alert, oriented, PERRLA, cranial nerves grossly normal, motor strength 5 over 5, reflexes 3+ @ right patella, 2+ on left symmetric, upper body coordination normal, gait normal, Skin: No rash or ecchymosis  Lab Results: CBC W/Diff    Component Value  Date/Time   WBC 5.1 06/01/2016 1025   RBC 3.99 (L) 06/01/2016 1025   HGB 12.2 (L) 06/01/2016 1025   HCT 35.0 (L) 06/01/2016 1025   PLT 178 06/01/2016 1025   MCV 87.7 06/01/2016 1025   MCH 30.6 06/01/2016 1025   MCH 31.6 05/30/2010 1301   MCHC 34.9 06/01/2016 1025   RDW 12.4 06/01/2016 1025   LYMPHSABS  1.6 06/01/2016 1025   MONOABS 0.5 06/01/2016 1025   EOSABS 1.1 (H) 06/01/2016 1025   BASOSABS 0.1 06/01/2016 1025     Chemistry      Component Value Date/Time   NA 140 06/01/2016 1025   K 3.5 06/01/2016 1025   CL 103 09/12/2012 1119   CO2 31 (H) 06/01/2016 1025   BUN 22.8 06/01/2016 1025   CREATININE 1.5 (H) 06/01/2016 1025      Component Value Date/Time   CALCIUM 9.2 06/01/2016 1025   ALKPHOS 90 06/01/2016 1025   AST 23 06/01/2016 1025   ALT 18 06/01/2016 1025   BILITOT 0.47 06/01/2016 1025       Radiological Studies: No results found.  Impression:  #1. CML He remains in a molecular remission. Bcr-abl transcripts falling back on Gleevec. I would like to get a four-month interval follow-up study in early April to make sure that he gets back to the 4.5 log level below baseline.  #2. Essential hypertension Now controlled on medication and with diet  #3. Rising creatinine over baseline This could be secondary to long-term imatinib use. Alternatively, it may just reflect mild iatrogenic dehydration from recently started diuretic. I would like to get a 24-hour urine creatinine clearance for a more objective measure of his renal function.  CC: Patient Care Team: No Pcp Per Patient as PCP - General (General Practice)   Murriel Hopper, MD, [place]  Hematology-Oncology/Internal Medicine  2/12/20184:43 PM

## 2016-06-14 ENCOUNTER — Ambulatory Visit (INDEPENDENT_AMBULATORY_CARE_PROVIDER_SITE_OTHER): Payer: BLUE CROSS/BLUE SHIELD | Admitting: *Deleted

## 2016-06-14 ENCOUNTER — Other Ambulatory Visit (INDEPENDENT_AMBULATORY_CARE_PROVIDER_SITE_OTHER): Payer: BLUE CROSS/BLUE SHIELD

## 2016-06-14 DIAGNOSIS — I10 Essential (primary) hypertension: Secondary | ICD-10-CM | POA: Diagnosis not present

## 2016-06-14 DIAGNOSIS — R7989 Other specified abnormal findings of blood chemistry: Secondary | ICD-10-CM

## 2016-06-14 DIAGNOSIS — Z23 Encounter for immunization: Secondary | ICD-10-CM | POA: Diagnosis not present

## 2016-06-14 NOTE — Progress Notes (Signed)
Dr Beryle Beams stated ok to give pt flu vaccine.

## 2016-06-15 LAB — CREATININE CLEARANCE, URINE, 24 HOUR
Creatinine Clearance: 57 mL/min — ABNORMAL LOW (ref 97–137)
Creatinine, 24H Ur: 1135 mg/24 hr (ref 1000–2000)
Creatinine, Ser: 1.38 mg/dL — ABNORMAL HIGH (ref 0.76–1.27)
Creatinine, Urine: 84.1 mg/dL
GFR calc non Af Amer: 57 mL/min/{1.73_m2} — ABNORMAL LOW (ref >59)
GFR, EST AFRICAN AMERICAN: 66 mL/min/{1.73_m2} (ref >59)

## 2016-06-15 LAB — BASIC METABOLIC PANEL
BUN/Creatinine Ratio: 14 (ref 9–20)
BUN: 20 mg/dL (ref 6–24)
CALCIUM: 9.2 mg/dL (ref 8.7–10.2)
CHLORIDE: 97 mmol/L (ref 96–106)
CO2: 28 mmol/L (ref 18–29)
CREATININE: 1.38 mg/dL — AB (ref 0.76–1.27)
GFR calc Af Amer: 66 mL/min/{1.73_m2} (ref >59)
GFR calc non Af Amer: 57 mL/min/{1.73_m2} — ABNORMAL LOW (ref >59)
Glucose: 60 mg/dL — ABNORMAL LOW (ref 65–99)
Potassium: 3.7 mmol/L (ref 3.5–5.2)
Sodium: 142 mmol/L (ref 134–144)

## 2016-06-19 ENCOUNTER — Telehealth: Payer: Self-pay | Admitting: *Deleted

## 2016-06-19 NOTE — Telephone Encounter (Signed)
Pt called / informed "kidney function 57% of normal. Repeat creatinine was 1.4. We will continue to monitor." per Dr Beryle Beams. Stated he should be able to check labs on My Chart but will let know if he needs a copy mailed.

## 2016-06-19 NOTE — Telephone Encounter (Signed)
-----   Message from Annia Belt, MD sent at 06/18/2016  4:43 PM EST ----- Call pt: kidney function 57% of normal. Repeat creatinine was 1.4. We will continue to monitor. OK to send him copy of report.

## 2016-06-25 ENCOUNTER — Other Ambulatory Visit: Payer: BLUE CROSS/BLUE SHIELD

## 2016-09-17 ENCOUNTER — Other Ambulatory Visit: Payer: BLUE CROSS/BLUE SHIELD

## 2016-10-03 ENCOUNTER — Other Ambulatory Visit (INDEPENDENT_AMBULATORY_CARE_PROVIDER_SITE_OTHER): Payer: BLUE CROSS/BLUE SHIELD

## 2016-10-03 DIAGNOSIS — C9211 Chronic myeloid leukemia, BCR/ABL-positive, in remission: Secondary | ICD-10-CM | POA: Diagnosis not present

## 2016-10-03 DIAGNOSIS — I10 Essential (primary) hypertension: Secondary | ICD-10-CM

## 2016-10-03 DIAGNOSIS — R7989 Other specified abnormal findings of blood chemistry: Secondary | ICD-10-CM

## 2016-10-03 NOTE — Addendum Note (Signed)
Addended by: Truddie Crumble on: 10/03/2016 10:39 AM   Modules accepted: Orders

## 2016-10-04 LAB — CBC WITH DIFFERENTIAL/PLATELET
BASOS: 1 %
Basophils Absolute: 0 10*3/uL (ref 0.0–0.2)
EOS (ABSOLUTE): 1 10*3/uL — AB (ref 0.0–0.4)
EOS: 19 %
HEMATOCRIT: 36.6 % — AB (ref 37.5–51.0)
Hemoglobin: 12.4 g/dL — ABNORMAL LOW (ref 13.0–17.7)
IMMATURE GRANS (ABS): 0 10*3/uL (ref 0.0–0.1)
IMMATURE GRANULOCYTES: 0 %
Lymphocytes Absolute: 1.5 10*3/uL (ref 0.7–3.1)
Lymphs: 27 %
MCH: 31.1 pg (ref 26.6–33.0)
MCHC: 33.9 g/dL (ref 31.5–35.7)
MCV: 92 fL (ref 79–97)
MONOS ABS: 0.6 10*3/uL (ref 0.1–0.9)
Monocytes: 10 %
NEUTROS ABS: 2.4 10*3/uL (ref 1.4–7.0)
NEUTROS PCT: 43 %
PLATELETS: 201 10*3/uL (ref 150–379)
RBC: 3.99 x10E6/uL — ABNORMAL LOW (ref 4.14–5.80)
RDW: 13 % (ref 12.3–15.4)
WBC: 5.5 10*3/uL (ref 3.4–10.8)

## 2016-10-04 LAB — COMPREHENSIVE METABOLIC PANEL
A/G RATIO: 2.1 (ref 1.2–2.2)
ALT: 16 IU/L (ref 0–44)
AST: 21 IU/L (ref 0–40)
Albumin: 4.4 g/dL (ref 3.5–5.5)
Alkaline Phosphatase: 80 IU/L (ref 39–117)
BILIRUBIN TOTAL: 0.5 mg/dL (ref 0.0–1.2)
BUN/Creatinine Ratio: 13 (ref 9–20)
BUN: 20 mg/dL (ref 6–24)
CALCIUM: 9.2 mg/dL (ref 8.7–10.2)
CHLORIDE: 97 mmol/L (ref 96–106)
CO2: 29 mmol/L (ref 18–29)
Creatinine, Ser: 1.51 mg/dL — ABNORMAL HIGH (ref 0.76–1.27)
GFR, EST AFRICAN AMERICAN: 58 mL/min/{1.73_m2} — AB (ref >59)
GFR, EST NON AFRICAN AMERICAN: 51 mL/min/{1.73_m2} — AB (ref >59)
GLOBULIN, TOTAL: 2.1 g/dL (ref 1.5–4.5)
Glucose: 87 mg/dL (ref 65–99)
POTASSIUM: 3.5 mmol/L (ref 3.5–5.2)
SODIUM: 139 mmol/L (ref 134–144)
TOTAL PROTEIN: 6.5 g/dL (ref 6.0–8.5)

## 2016-10-04 LAB — LACTATE DEHYDROGENASE: LDH: 198 IU/L (ref 121–224)

## 2016-10-29 LAB — BCR/ABL (LIO MMD)

## 2016-11-09 ENCOUNTER — Other Ambulatory Visit: Payer: Self-pay | Admitting: *Deleted

## 2016-11-09 MED ORDER — IMATINIB MESYLATE 400 MG PO TABS: 400.0000 mg | ORAL_TABLET | Freq: Every day | ORAL | 11 refills | Status: DC

## 2016-12-10 ENCOUNTER — Other Ambulatory Visit: Payer: BLUE CROSS/BLUE SHIELD

## 2017-03-04 ENCOUNTER — Other Ambulatory Visit: Payer: BLUE CROSS/BLUE SHIELD

## 2017-03-18 ENCOUNTER — Encounter: Payer: Self-pay | Admitting: Oncology

## 2017-03-18 ENCOUNTER — Ambulatory Visit (INDEPENDENT_AMBULATORY_CARE_PROVIDER_SITE_OTHER): Payer: BLUE CROSS/BLUE SHIELD | Admitting: Oncology

## 2017-03-18 VITALS — BP 140/70 | HR 86 | Temp 97.8°F | Ht 68.0 in | Wt 152.2 lb

## 2017-03-18 DIAGNOSIS — C9211 Chronic myeloid leukemia, BCR/ABL-positive, in remission: Secondary | ICD-10-CM | POA: Diagnosis not present

## 2017-03-18 DIAGNOSIS — Z23 Encounter for immunization: Secondary | ICD-10-CM

## 2017-03-18 DIAGNOSIS — G8929 Other chronic pain: Secondary | ICD-10-CM

## 2017-03-18 DIAGNOSIS — I129 Hypertensive chronic kidney disease with stage 1 through stage 4 chronic kidney disease, or unspecified chronic kidney disease: Secondary | ICD-10-CM

## 2017-03-18 DIAGNOSIS — N183 Chronic kidney disease, stage 3 unspecified: Secondary | ICD-10-CM

## 2017-03-18 DIAGNOSIS — I10 Essential (primary) hypertension: Secondary | ICD-10-CM

## 2017-03-18 DIAGNOSIS — M5416 Radiculopathy, lumbar region: Secondary | ICD-10-CM | POA: Diagnosis not present

## 2017-03-18 DIAGNOSIS — C9210 Chronic myeloid leukemia, BCR/ABL-positive, not having achieved remission: Secondary | ICD-10-CM | POA: Diagnosis not present

## 2017-03-18 DIAGNOSIS — Z79899 Other long term (current) drug therapy: Secondary | ICD-10-CM

## 2017-03-18 HISTORY — DX: Chronic kidney disease, stage 3 unspecified: N18.30

## 2017-03-18 MED ORDER — AMLODIPINE BESYLATE 2.5 MG PO TABS: 2.5000 mg | ORAL_TABLET | Freq: Every day | ORAL | 6 refills | Status: DC

## 2017-03-18 NOTE — Progress Notes (Signed)
Hematology and Oncology Follow Up Visit  Patrick Nash [id number] [birth year] 56 y.o. 03/18/2017 4:05 PM   Principle Diagnosis: Encounter Diagnoses  Name Primary?  . 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 CKD (chronic kidney disease) stage 3, GFR 30-59 ml/min (HCC)   . 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 . He has now been on [place] for almost 14 years and remains in a molecular remission. 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 He was initially treated with Gleevec at 400 mg daily, subsequently escalated to 800 mg, and then over time, decreased to current dose of 400 mg. He achieved a rapid initial major molecular response. He has been able to maintain this over time despite dose reduction.  In view of recent data showing that about 50% of patients who have been in a long-term molecular remission will stay in remission if imatinib or other second generation tyrosine kinase inhibitors are stopped, I gave him a trial off drug subsequent to his April 2017 visit. A bcr-abl analysis done on 10/17/2015, it did show a rise in his transcript level of 3 logs although he was still at the 3 logs below baseline mark.Gleevec was resumed and he has regained a deep molecular remission.   Interim History: Time of his most recent visit we were reevaluating his renal function.  He has had chronic borderline increase in creatinine with average creatinine over the last 10 years of 1.2-1.3.  Of note the reference range for creatinines as changed multiple times over the same interval.  Over the last year, creatinine has inched up to 1.5.  I obtained a 24-hour urine for creatinine clearance on June 14, 2016 and it was 57 mL/min with a collection of 1.1 L.  Creatinine that day was 1.4. His  blood pressure has been borderline increased.  In an attempt to forestall any further renal dysfunction, I start an antihypertensive therapy.  For his part, he increased his exercise and lost weight.  Blood pressure at time of visit with me in February was good at 134/73 but up today at 140/70.  Weight is 152 pounds down from 162 pounds in April 2017.  He has had a recent flareup of some chronic low back elements most pronounced when he walks up a flight of stairs.  He continues to have increased stress at home.  His mother is starting to lose cognitive function.  Family just had her placed in an assisted living facility since she could no longer manage to live independently without supervision.   Medications: reviewed  Allergies: No Known Allergies  Review of Systems: See interim history Remaining ROS negative:   Physical Exam: Blood pressure 140/70, pulse 86, temperature 97.8 F (36.6 C), temperature source Oral, height 5\' 8"  (1.727 m), weight 152 lb 3.2 oz (69 kg), SpO2 100 %. Wt Readings from Last 3 Encounters:  03/18/17 152 lb 3.2 oz (69 kg)  06/11/16 153 lb 11.2 oz (69.7 kg)  12/12/15 161 lb (73 kg)     General appearance: Chronically pale Caucasian man HENNT: Pharynx no erythema, exudate, mass, or ulcer. No thyromegaly or thyroid nodules Lymph nodes: No cervical, supraclavicular, or axillary lymphadenopathy Breasts: Lungs: Clear to auscultation, resonant to percussion throughout Heart: Regular rhythm, no murmur, no gallop, no rub, no click, no edema Abdomen: Soft, nontender, normal bowel sounds,  no mass, no organomegaly Extremities: No edema, no calf tenderness Musculoskeletal: no joint deformities GU:  Vascular: Carotid pulses 2+, no bruits, Neurologic: Alert, oriented, PERRLA, cranial nerves grossly normal, motor strength 5 over 5, reflexes 1+ symmetric, upper body coordination normal, gait normal, Skin: No rash or ecchymosis  Lab Results: CBC W/Diff    Component  Value Date/Time   WBC 5.5 10/03/2016 1007   WBC 5.1 06/01/2016 1025   RBC 3.99 (L) 10/03/2016 1007   RBC 3.99 (L) 06/01/2016 1025   HGB 12.4 (L) 10/03/2016 1007   HGB 12.2 (L) 06/01/2016 1025   HCT 36.6 (L) 10/03/2016 1007   HCT 35.0 (L) 06/01/2016 1025   PLT 201 10/03/2016 1007   MCV 92 10/03/2016 1007   MCV 87.7 06/01/2016 1025   MCH 31.1 10/03/2016 1007   MCH 30.6 06/01/2016 1025   MCH 31.6 05/30/2010 1301   MCHC 33.9 10/03/2016 1007   MCHC 34.9 06/01/2016 1025   RDW 13.0 10/03/2016 1007   RDW 12.4 06/01/2016 1025   LYMPHSABS 1.5 10/03/2016 1007   LYMPHSABS 1.6 06/01/2016 1025   MONOABS 0.5 06/01/2016 1025   EOSABS 1.0 (H) 10/03/2016 1007   BASOSABS 0.0 10/03/2016 1007   BASOSABS 0.1 06/01/2016 1025     Chemistry      Component Value Date/Time   NA 139 10/03/2016 1007   NA 140 06/01/2016 1025   K 3.5 10/03/2016 1007   K 3.5 06/01/2016 1025   CL 97 10/03/2016 1007   CL 103 09/12/2012 1119   CO2 29 10/03/2016 1007   CO2 31 (H) 06/01/2016 1025   BUN 20 10/03/2016 1007   BUN 22.8 06/01/2016 1025   CREATININE 1.51 (H) 10/03/2016 1007   CREATININE 1.5 (H) 06/01/2016 1025      Component Value Date/Time   CALCIUM 9.2 10/03/2016 1007   CALCIUM 9.2 06/01/2016 1025   ALKPHOS 80 10/03/2016 1007   ALKPHOS 90 06/01/2016 1025   AST 21 10/03/2016 1007   AST 23 06/01/2016 1025   ALT 16 10/03/2016 1007   ALT 18 06/01/2016 1025   BILITOT 0.5 10/03/2016 1007   BILITOT 0.47 06/01/2016 1025       Radiological Studies: No results found.  Impression:  1.  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  Plan is continue drug indefinitely.  2.  Essential hypertension I am going to add low-dose amlodipine 2.5 mg daily to his thiazide diuretic.  3.  Stage III chronic renal insufficiency We discussed this at length today.  Although it is possible that the  imatinib is partially responsible, given the fact that he failed a trial of the drug and has otherwise been in continuous remission since diagnosis 14 years ago, I am not going to stop the drug again.  We discussed the importance of good blood pressure control as a means to decrease chance of further deterioration in his renal function.  See above #2.  4.  Intermittent low back and left leg discomfort with remote history of as I recall, a lumbar radiculopathy which improved with conservative therapy.  No focal motor neurologic deficit on exam today.  Sensation not tested.  If symptoms become more frequent or progressive, I will reevaluate with imaging.  CC: Patient Care Team: Patient, No Pcp Per as PCP - General (General Practice)   Murriel Hopper, MD, FACP  Hematology-Oncology/Internal Medicine  11/19/20184:05 PM

## 2017-03-18 NOTE — Patient Instructions (Signed)
To lab today Then lab every 4 months MD visit 1-2 weeks after lab in 4 months

## 2017-03-19 ENCOUNTER — Telehealth: Payer: Self-pay | Admitting: *Deleted

## 2017-03-19 LAB — CBC WITH DIFFERENTIAL/PLATELET
BASOS ABS: 0 10*3/uL (ref 0.0–0.2)
Basos: 1 %
EOS (ABSOLUTE): 1.2 10*3/uL — AB (ref 0.0–0.4)
EOS: 21 %
Hematocrit: 36 % — ABNORMAL LOW (ref 37.5–51.0)
Hemoglobin: 11.8 g/dL — ABNORMAL LOW (ref 13.0–17.7)
IMMATURE GRANULOCYTES: 0 %
Immature Grans (Abs): 0 10*3/uL (ref 0.0–0.1)
Lymphocytes Absolute: 1.5 10*3/uL (ref 0.7–3.1)
Lymphs: 26 %
MCH: 30.6 pg (ref 26.6–33.0)
MCHC: 32.8 g/dL (ref 31.5–35.7)
MCV: 93 fL (ref 79–97)
MONOS ABS: 0.5 10*3/uL (ref 0.1–0.9)
Monocytes: 8 %
NEUTROS PCT: 44 %
Neutrophils Absolute: 2.4 10*3/uL (ref 1.4–7.0)
Platelets: 192 10*3/uL (ref 150–379)
RBC: 3.86 x10E6/uL — AB (ref 4.14–5.80)
RDW: 12.9 % (ref 12.3–15.4)
WBC: 5.5 10*3/uL (ref 3.4–10.8)

## 2017-03-19 LAB — COMPREHENSIVE METABOLIC PANEL
ALBUMIN: 4.5 g/dL (ref 3.5–5.5)
ALK PHOS: 77 IU/L (ref 39–117)
ALT: 16 IU/L (ref 0–44)
AST: 25 IU/L (ref 0–40)
Albumin/Globulin Ratio: 2 (ref 1.2–2.2)
BILIRUBIN TOTAL: 0.4 mg/dL (ref 0.0–1.2)
BUN / CREAT RATIO: 12 (ref 9–20)
BUN: 17 mg/dL (ref 6–24)
CHLORIDE: 98 mmol/L (ref 96–106)
CO2: 28 mmol/L (ref 20–29)
Calcium: 9.1 mg/dL (ref 8.7–10.2)
Creatinine, Ser: 1.42 mg/dL — ABNORMAL HIGH (ref 0.76–1.27)
GFR calc non Af Amer: 54 mL/min/{1.73_m2} — ABNORMAL LOW (ref >59)
GFR, EST AFRICAN AMERICAN: 63 mL/min/{1.73_m2} (ref >59)
GLOBULIN, TOTAL: 2.3 g/dL (ref 1.5–4.5)
Glucose: 84 mg/dL (ref 65–99)
Potassium: 3.5 mmol/L (ref 3.5–5.2)
SODIUM: 141 mmol/L (ref 134–144)
Total Protein: 6.8 g/dL (ref 6.0–8.5)

## 2017-03-19 LAB — LACTATE DEHYDROGENASE: LDH: 204 IU/L (ref 121–224)

## 2017-03-19 NOTE — Telephone Encounter (Signed)
Called pt - no answer; left message"lab overall stable compared with previous. Creatinine a little lower @ 1.4, was 1.5" per Dr Beryle Beams. And call for any questions.

## 2017-03-19 NOTE — Telephone Encounter (Signed)
-----   Message from Annia Belt, MD sent at 03/19/2017  1:47 PM EST ----- Call pt: lab overall stable compared with previous. Creatinine a little lower @ 1.4, was 1.5

## 2017-04-05 LAB — BCR/ABL (LIO MMD)

## 2017-04-08 ENCOUNTER — Ambulatory Visit: Payer: BLUE CROSS/BLUE SHIELD

## 2017-04-11 ENCOUNTER — Ambulatory Visit: Payer: BLUE CROSS/BLUE SHIELD | Admitting: *Deleted

## 2017-04-11 VITALS — BP 134/75 | HR 78

## 2017-04-11 DIAGNOSIS — I10 Essential (primary) hypertension: Secondary | ICD-10-CM

## 2017-04-11 NOTE — Progress Notes (Signed)
Pt's here for BP re-check; 134/75. Dr Beryle Beams informed/talked to pt.

## 2017-05-27 ENCOUNTER — Other Ambulatory Visit: Payer: Self-pay | Admitting: *Deleted

## 2017-05-27 MED ORDER — TRIAMTERENE-HCTZ 37.5-25 MG PO CAPS: 1.0000 | ORAL_CAPSULE | Freq: Every day | ORAL | 0 refills | Status: DC

## 2017-07-11 ENCOUNTER — Other Ambulatory Visit: Payer: Self-pay | Admitting: Oncology

## 2017-07-11 DIAGNOSIS — C9211 Chronic myeloid leukemia, BCR/ABL-positive, in remission: Secondary | ICD-10-CM

## 2017-07-15 ENCOUNTER — Other Ambulatory Visit: Payer: Self-pay | Admitting: Oncology

## 2017-07-15 ENCOUNTER — Other Ambulatory Visit (INDEPENDENT_AMBULATORY_CARE_PROVIDER_SITE_OTHER): Payer: BLUE CROSS/BLUE SHIELD

## 2017-07-15 DIAGNOSIS — N183 Chronic kidney disease, stage 3 unspecified: Secondary | ICD-10-CM

## 2017-07-15 DIAGNOSIS — C9211 Chronic myeloid leukemia, BCR/ABL-positive, in remission: Secondary | ICD-10-CM

## 2017-07-16 LAB — CBC WITH DIFFERENTIAL/PLATELET
BASOS ABS: 0.1 10*3/uL (ref 0.0–0.2)
Basos: 1 %
EOS (ABSOLUTE): 1.7 10*3/uL — ABNORMAL HIGH (ref 0.0–0.4)
Eos: 27 %
Hematocrit: 33.1 % — ABNORMAL LOW (ref 37.5–51.0)
Hemoglobin: 11.2 g/dL — ABNORMAL LOW (ref 13.0–17.7)
IMMATURE GRANULOCYTES: 0 %
Immature Grans (Abs): 0 10*3/uL (ref 0.0–0.1)
LYMPHS ABS: 1.4 10*3/uL (ref 0.7–3.1)
LYMPHS: 23 %
MCH: 31.1 pg (ref 26.6–33.0)
MCHC: 33.8 g/dL (ref 31.5–35.7)
MCV: 92 fL (ref 79–97)
MONOCYTES: 10 %
Monocytes Absolute: 0.6 10*3/uL (ref 0.1–0.9)
NEUTROS PCT: 39 %
Neutrophils Absolute: 2.4 10*3/uL (ref 1.4–7.0)
PLATELETS: 189 10*3/uL (ref 150–379)
RBC: 3.6 x10E6/uL — AB (ref 4.14–5.80)
RDW: 13.1 % (ref 12.3–15.4)
WBC: 6.1 10*3/uL (ref 3.4–10.8)

## 2017-07-16 LAB — COMPREHENSIVE METABOLIC PANEL
ALBUMIN: 4.1 g/dL (ref 3.5–5.5)
ALT: 18 IU/L (ref 0–44)
AST: 23 IU/L (ref 0–40)
Albumin/Globulin Ratio: 1.5 (ref 1.2–2.2)
Alkaline Phosphatase: 82 IU/L (ref 39–117)
BUN / CREAT RATIO: 14 (ref 9–20)
BUN: 22 mg/dL (ref 6–24)
Bilirubin Total: 0.3 mg/dL (ref 0.0–1.2)
CALCIUM: 9 mg/dL (ref 8.7–10.2)
CO2: 27 mmol/L (ref 20–29)
CREATININE: 1.53 mg/dL — AB (ref 0.76–1.27)
Chloride: 99 mmol/L (ref 96–106)
GFR calc Af Amer: 58 mL/min/{1.73_m2} — ABNORMAL LOW (ref >59)
GFR, EST NON AFRICAN AMERICAN: 50 mL/min/{1.73_m2} — AB (ref >59)
GLOBULIN, TOTAL: 2.7 g/dL (ref 1.5–4.5)
Glucose: 84 mg/dL (ref 65–99)
Potassium: 3.5 mmol/L (ref 3.5–5.2)
SODIUM: 142 mmol/L (ref 134–144)
Total Protein: 6.8 g/dL (ref 6.0–8.5)

## 2017-07-23 LAB — BCR-ABL1, CML/ALL, PCR, QUANT

## 2017-07-29 ENCOUNTER — Ambulatory Visit: Payer: BLUE CROSS/BLUE SHIELD | Admitting: Oncology

## 2017-07-29 ENCOUNTER — Encounter: Payer: Self-pay | Admitting: Oncology

## 2017-07-29 ENCOUNTER — Other Ambulatory Visit: Payer: Self-pay

## 2017-07-29 VITALS — BP 133/66 | HR 78 | Temp 97.6°F | Ht 68.0 in | Wt 152.5 lb

## 2017-07-29 DIAGNOSIS — N183 Chronic kidney disease, stage 3 unspecified: Secondary | ICD-10-CM

## 2017-07-29 DIAGNOSIS — I129 Hypertensive chronic kidney disease with stage 1 through stage 4 chronic kidney disease, or unspecified chronic kidney disease: Secondary | ICD-10-CM

## 2017-07-29 DIAGNOSIS — Z79899 Other long term (current) drug therapy: Secondary | ICD-10-CM | POA: Diagnosis not present

## 2017-07-29 DIAGNOSIS — D721 Eosinophilia, unspecified: Secondary | ICD-10-CM

## 2017-07-29 DIAGNOSIS — C9211 Chronic myeloid leukemia, BCR/ABL-positive, in remission: Secondary | ICD-10-CM

## 2017-07-29 DIAGNOSIS — I10 Essential (primary) hypertension: Secondary | ICD-10-CM

## 2017-07-29 DIAGNOSIS — T50905A Adverse effect of unspecified drugs, medicaments and biological substances, initial encounter: Secondary | ICD-10-CM

## 2017-07-29 HISTORY — DX: Eosinophilia, unspecified: D72.10

## 2017-07-29 HISTORY — DX: Adverse effect of unspecified drugs, medicaments and biological substances, initial encounter: T50.905A

## 2017-07-29 NOTE — Progress Notes (Signed)
Hematology and Oncology Follow Up Visit  Patrick Nash [id number] 06-30-1960 57 y.o. 07/29/2017 10:32 AM   Principle Diagnosis: Encounter Diagnoses  Name Primary?  . HTN (hypertension), benign   . CKD (chronic kidney disease) stage 3, GFR 30-59 ml/min (HCC)   . 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 . He has now been on [place] for almost 14 years and remains in a molecular remission. 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 He was initially treated with Gleevec at 400 mg daily, subsequently escalated to 800 mg, and then over time, decreased to current dose of 400 mg. He achieved a rapid initial major molecular response. He has been able to maintain this over time despite dose reduction.  In view of recent data showing that about 50% of patients who have been in a long-term molecular remission will stay in remission if imatinib or other second generation tyrosine kinase inhibitors are stopped, I gave him a trial off drug subsequent to his April 2017 visit. A bcr-abl analysis done on 10/17/2015, it did show a rise in his transcript level of 3 logs although he was still at the 3 logs below baseline mark.Gleevec was resumed and he has regained a deep molecular remission.    Interim History:   No interim medical problems.  Repeat bcr-abl analysis in anticipation of today's visit done on March 18 shows that he remains in a deep 4.5 log molecular remission. I noticed in reviewing his serial CBCs, he has had a progressive eosinophilia over the last 3 years now up to 27% on most recent white count differential.  For many years eosinophil percent was consistently 5-6%.  In September 2016 it rose up to 15% with a further increase to 20% in December 2017 and at 27% as of July 15, 2017. He has no  systemic symptoms of allergy.  He does recall switching from brand to generic imatinib about 3 years ago. Blood pressure now under good control.  Currently on Maxide 37.5-25 and amlodipine 2.5 mg daily. Renal function remains abnormal but stable with new baseline creatinine 1.4-1.5 over the last 2 years with previous baseline 1.2-1.3 back as far as 2007.  Gleevec was initiated in 2004.  No lab values prior to 2004 in [place]. A 24-hour urine for creatinine clearance done June 14, 2016 was 57 mL/min with a concomitant serum creatinine of 1.4.  Medications:  Current Outpatient Medications:  .  amLODipine (NORVASC) 2.5 MG tablet, Take 1 tablet (2.5 mg total) daily by mouth., Disp: 30 tablet, Rfl: 6 .  aspirin EC 325 MG tablet, Take 325 mg by mouth as needed., Disp: , Rfl:  .  imatinib (GLEEVEC) 400 MG tablet, Take 1 tablet (400 mg total) by mouth daily. Take with meals and large glass of water.Caution:Chemotherapy., Disp: 30 tablet, Rfl: 11 .  loratadine (CLARITIN) 10 MG tablet, Take 10 mg by mouth daily as needed. , Disp: , Rfl:  .  triamterene-hydrochlorothiazide (DYAZIDE) 37.5-25 MG capsule, Take 1 each (1 capsule total) by mouth daily., Disp: 90 capsule, Rfl: 0  Allergies: No Known Allergies  Review of Systems: See interim history Remaining ROS negative:   Physical Exam: Blood pressure 133/66, pulse 78, temperature 97.6 F (36.4 C), temperature source Oral, height 5\' 8"  (1.727 m), weight 152 lb 8 oz (69.2 kg), SpO2 100 %. Wt Readings  from Last 3 Encounters:  07/29/17 152 lb 8 oz (69.2 kg)  03/18/17 152 lb 3.2 oz (69 kg)  06/11/16 153 lb 11.2 oz (69.7 kg)     General appearance: Chronically pale Caucasian man. HENNT: Male pattern baldness.  Pharynx no erythema, exudate, mass, or ulcer. No thyromegaly or thyroid nodules Lymph nodes: No cervical, supraclavicular, or axillary lymphadenopathy Breasts:  Lungs: Clear to auscultation, resonant to percussion throughout Heart:  Regular rhythm, no murmur, no gallop, no rub, no click, no edema Abdomen: Soft, nontender, normal bowel sounds, no mass, no organomegaly Extremities: No edema, no calf tenderness Musculoskeletal: no joint deformities GU:  Vascular: Carotid pulses 2+, no bruits, distal pulses: Dorsalis pedis 1+ symmetric Neurologic: Alert, oriented, PERRLA, unable to visualize the optic discs sharp bilateral cataracts?   cranial nerves grossly normal, motor strength 5 over 5, reflexes 3+ right knee, 2+ left knee, 1+ symmetric at the biceps, upper body coordination normal, gait normal, Skin: No rash or ecchymosis  Lab Results: CBC W/Diff    Component Value Date/Time   WBC 6.1 07/15/2017 0941   WBC 5.1 06/01/2016 1025   RBC 3.60 (L) 07/15/2017 0941   RBC 3.99 (L) 06/01/2016 1025   HGB 11.2 (L) 07/15/2017 0941   HGB 12.2 (L) 06/01/2016 1025   HCT 33.1 (L) 07/15/2017 0941   HCT 35.0 (L) 06/01/2016 1025   PLT 189 07/15/2017 0941   MCV 92 07/15/2017 0941   MCV 87.7 06/01/2016 1025   MCH 31.1 07/15/2017 0941   MCH 30.6 06/01/2016 1025   MCH 31.6 05/30/2010 1301   MCHC 33.8 07/15/2017 0941   MCHC 34.9 06/01/2016 1025   RDW 13.1 07/15/2017 0941   RDW 12.4 06/01/2016 1025   LYMPHSABS 1.4 07/15/2017 0941   LYMPHSABS 1.6 06/01/2016 1025   MONOABS 0.5 06/01/2016 1025   EOSABS 1.7 (H) 07/15/2017 0941   BASOSABS 0.1 07/15/2017 0941   BASOSABS 0.1 06/01/2016 1025     Chemistry      Component Value Date/Time   NA 142 07/15/2017 0941   NA 140 06/01/2016 1025   K 3.5 07/15/2017 0941   K 3.5 06/01/2016 1025   CL 99 07/15/2017 0941   CL 103 09/12/2012 1119   CO2 27 07/15/2017 0941   CO2 31 (H) 06/01/2016 1025   BUN 22 07/15/2017 0941   BUN 22.8 06/01/2016 1025   CREATININE 1.53 (H) 07/15/2017 0941   CREATININE 1.5 (H) 06/01/2016 1025      Component Value Date/Time   CALCIUM 9.0 07/15/2017 0941   CALCIUM 9.2 06/01/2016 1025   ALKPHOS 82 07/15/2017 0941   ALKPHOS 90 06/01/2016 1025   AST 23  07/15/2017 0941   AST 23 06/01/2016 1025   ALT 18 07/15/2017 0941   ALT 18 06/01/2016 1025   BILITOT 0.3 07/15/2017 0941   BILITOT 0.47 06/01/2016 1025       Radiological Studies: No results found.  Impression:  1.  CML in ongoing deep molecular remission on imatinib. I am concerned with his rising eosinophilia.  I have sent an inquiry to my consult a colleague database through the [employer] of hematology.  I am waiting for a reply and advice.  Given the deep molecular remission and absence of other symptoms I am reluctant to discontinue the imatinib until I get advice from an international expert in the field.  I discussed this with the patient today.  We have a number of second generation and third generation drugs that we could transition him to  if indicated. I will continue every other month routine labs and every 34-month BCR-ABL  analyses.  2.  Slowly progressive renal dysfunction currently stage III This may also be secondary to the imatinib.  I have seen this in at least one other patient.  Likely contribution from hypertension which is now well controlled.  I reemphasized how important it is to keep his blood pressure controlled in terms of preserving his renal function.  3.  Essential hypertension  CC: Patient Care Team: Patient, No Pcp Per as PCP - General (General Practice)   Murriel Hopper, MD, FACP  Hematology-Oncology/Internal Medicine     4/1/201910:32 AM

## 2017-07-29 NOTE — Patient Instructions (Signed)
Continue routine lab every 2 months next 09/16/17 Bcr-abl analysis in 4 months July 20 MD visit 6 months

## 2017-08-05 ENCOUNTER — Other Ambulatory Visit: Payer: Self-pay | Admitting: Oncology

## 2017-08-05 DIAGNOSIS — D721 Eosinophilia, unspecified: Secondary | ICD-10-CM

## 2017-08-05 DIAGNOSIS — T50905A Adverse effect of unspecified drugs, medicaments and biological substances, initial encounter: Secondary | ICD-10-CM

## 2017-08-07 ENCOUNTER — Other Ambulatory Visit (INDEPENDENT_AMBULATORY_CARE_PROVIDER_SITE_OTHER): Payer: BLUE CROSS/BLUE SHIELD

## 2017-08-07 DIAGNOSIS — D721 Eosinophilia, unspecified: Secondary | ICD-10-CM

## 2017-08-07 DIAGNOSIS — E538 Deficiency of other specified B group vitamins: Secondary | ICD-10-CM

## 2017-08-07 DIAGNOSIS — T50905A Adverse effect of unspecified drugs, medicaments and biological substances, initial encounter: Secondary | ICD-10-CM

## 2017-08-08 ENCOUNTER — Other Ambulatory Visit: Payer: Self-pay | Admitting: Oncology

## 2017-08-08 DIAGNOSIS — D721 Eosinophilia, unspecified: Secondary | ICD-10-CM

## 2017-08-08 DIAGNOSIS — E538 Deficiency of other specified B group vitamins: Secondary | ICD-10-CM

## 2017-08-08 NOTE — Addendum Note (Signed)
Addended by: Orson Gear on: 08/08/2017 11:57 AM   Modules accepted: Orders

## 2017-08-09 LAB — TRYPTASE: Tryptase: <1 ug/L — ABNORMAL LOW (ref 2.2–13.2)

## 2017-08-09 LAB — VITAMIN B12: Vitamin B-12: 162 pg/mL — ABNORMAL LOW (ref 232–1245)

## 2017-08-11 LAB — METHYLMALONIC ACID, SERUM: METHYLMALONIC ACID: 438 nmol/L — AB (ref 0–378)

## 2017-08-11 LAB — SPECIMEN STATUS REPORT

## 2017-08-13 ENCOUNTER — Encounter: Payer: Self-pay | Admitting: Oncology

## 2017-08-13 ENCOUNTER — Other Ambulatory Visit: Payer: Self-pay | Admitting: Oncology

## 2017-08-13 DIAGNOSIS — C9211 Chronic myeloid leukemia, BCR/ABL-positive, in remission: Secondary | ICD-10-CM

## 2017-08-13 DIAGNOSIS — D721 Eosinophilia: Principal | ICD-10-CM

## 2017-08-13 DIAGNOSIS — T50905A Adverse effect of unspecified drugs, medicaments and biological substances, initial encounter: Secondary | ICD-10-CM

## 2017-08-13 NOTE — Progress Notes (Signed)
Phone conversation with patient: I have heard back from 2 colleagues through my professional website.  Dr.Mauro, professor and chief of the myeloproliferative section at [facility] and Dr. Sindy Guadeloupe, eosinophilia expert at the [place] for further advice on progressive eosinophilia in this patient. Both colleagues recommend excluding other possible reasons such as allergic reactions.  Both impressed that he still has a deep complete molecular remission on imatinib and did not suggest changing TK inhibitors at this point. Dr. Arman Filter recommended a B12 and a tryptase level.  B12 level came back low and confirmed with an elevated methylmalonic acid. Tryptase level was undetectable arguing against any significant acute allergic process. Above discussed with the patient.  He will start B12 1 mg daily.  I have already advised him to stop his amlodipine last week.  We will repeat a CBC off drug next week. I am going to hold off on getting a bone marrow biopsy at this time and continue with periodic monitoring of his counts.  No reason to suspect a parasite infection or a second neoplastic process such as a lymphoproliferative disorder.

## 2017-08-18 ENCOUNTER — Other Ambulatory Visit: Payer: Self-pay | Admitting: Internal Medicine

## 2017-08-21 ENCOUNTER — Other Ambulatory Visit (INDEPENDENT_AMBULATORY_CARE_PROVIDER_SITE_OTHER): Payer: BLUE CROSS/BLUE SHIELD

## 2017-08-21 DIAGNOSIS — C9211 Chronic myeloid leukemia, BCR/ABL-positive, in remission: Secondary | ICD-10-CM

## 2017-08-21 DIAGNOSIS — T50905A Adverse effect of unspecified drugs, medicaments and biological substances, initial encounter: Secondary | ICD-10-CM | POA: Diagnosis not present

## 2017-08-21 DIAGNOSIS — D721 Eosinophilia: Secondary | ICD-10-CM

## 2017-08-21 LAB — CBC WITH DIFFERENTIAL/PLATELET
Basophils Absolute: 0 10*3/uL (ref 0.0–0.1)
Basophils Relative: 1 %
EOS PCT: 21 %
Eosinophils Absolute: 1.1 10*3/uL — ABNORMAL HIGH (ref 0.0–0.7)
HEMATOCRIT: 32.8 % — AB (ref 39.0–52.0)
Hemoglobin: 10.8 g/dL — ABNORMAL LOW (ref 13.0–17.0)
LYMPHS PCT: 25 %
Lymphs Abs: 1.3 10*3/uL (ref 0.7–4.0)
MCH: 30.3 pg (ref 26.0–34.0)
MCHC: 32.9 g/dL (ref 30.0–36.0)
MCV: 92.1 fL (ref 78.0–100.0)
MONO ABS: 0.4 10*3/uL (ref 0.1–1.0)
MONOS PCT: 7 %
NEUTROS ABS: 2.4 10*3/uL (ref 1.7–7.7)
Neutrophils Relative %: 46 %
Platelets: 193 10*3/uL (ref 150–400)
RBC: 3.56 MIL/uL — ABNORMAL LOW (ref 4.22–5.81)
RDW: 12.9 % (ref 11.5–15.5)
WBC: 5.2 10*3/uL (ref 4.0–10.5)

## 2017-08-21 LAB — SAVE SMEAR

## 2017-08-22 ENCOUNTER — Other Ambulatory Visit: Payer: Self-pay | Admitting: Oncology

## 2017-08-22 DIAGNOSIS — C9211 Chronic myeloid leukemia, BCR/ABL-positive, in remission: Secondary | ICD-10-CM

## 2017-08-22 DIAGNOSIS — T50905A Adverse effect of unspecified drugs, medicaments and biological substances, initial encounter: Secondary | ICD-10-CM

## 2017-08-22 DIAGNOSIS — D721 Eosinophilia: Principal | ICD-10-CM

## 2017-09-06 NOTE — Addendum Note (Signed)
Addended by: Truddie Crumble on: 09/06/2017 11:40 AM   Modules accepted: Orders

## 2017-09-09 ENCOUNTER — Other Ambulatory Visit (INDEPENDENT_AMBULATORY_CARE_PROVIDER_SITE_OTHER): Payer: BLUE CROSS/BLUE SHIELD

## 2017-09-09 DIAGNOSIS — C9211 Chronic myeloid leukemia, BCR/ABL-positive, in remission: Secondary | ICD-10-CM | POA: Diagnosis not present

## 2017-09-09 DIAGNOSIS — D721 Eosinophilia, unspecified: Secondary | ICD-10-CM

## 2017-09-09 DIAGNOSIS — T50905A Adverse effect of unspecified drugs, medicaments and biological substances, initial encounter: Secondary | ICD-10-CM

## 2017-09-09 LAB — CBC WITH DIFFERENTIAL/PLATELET
BASOS ABS: 0 10*3/uL (ref 0.0–0.1)
BASOS PCT: 1 %
EOS ABS: 1.3 10*3/uL — AB (ref 0.0–0.7)
EOS PCT: 23 %
HEMATOCRIT: 34.1 % — AB (ref 39.0–52.0)
Hemoglobin: 11.2 g/dL — ABNORMAL LOW (ref 13.0–17.0)
Lymphocytes Relative: 28 %
Lymphs Abs: 1.6 10*3/uL (ref 0.7–4.0)
MCH: 30.5 pg (ref 26.0–34.0)
MCHC: 32.8 g/dL (ref 30.0–36.0)
MCV: 92.9 fL (ref 78.0–100.0)
MONO ABS: 0.5 10*3/uL (ref 0.1–1.0)
MONOS PCT: 10 %
Neutro Abs: 2.2 10*3/uL (ref 1.7–7.7)
Neutrophils Relative %: 38 %
PLATELETS: 190 10*3/uL (ref 150–400)
RBC: 3.67 MIL/uL — ABNORMAL LOW (ref 4.22–5.81)
RDW: 13.2 % (ref 11.5–15.5)
WBC: 5.6 10*3/uL (ref 4.0–10.5)

## 2017-09-10 ENCOUNTER — Telehealth: Payer: Self-pay | Admitting: *Deleted

## 2017-09-10 NOTE — Telephone Encounter (Signed)
-----   Message from Annia Belt, MD sent at 09/10/2017  2:25 PM EDT ----- Call pt: eosinophils about the same at 23%. I will continue to follow

## 2017-09-10 NOTE — Telephone Encounter (Signed)
Pt called / informed "eosinophils about the same at 23%. I will continue to follow" per Dr Beryle Beams. Stated he will be here next week for his regular labs (09/16/17).

## 2017-09-16 ENCOUNTER — Other Ambulatory Visit (INDEPENDENT_AMBULATORY_CARE_PROVIDER_SITE_OTHER): Payer: BLUE CROSS/BLUE SHIELD

## 2017-09-16 DIAGNOSIS — C9211 Chronic myeloid leukemia, BCR/ABL-positive, in remission: Secondary | ICD-10-CM | POA: Diagnosis not present

## 2017-09-16 DIAGNOSIS — I10 Essential (primary) hypertension: Secondary | ICD-10-CM | POA: Diagnosis not present

## 2017-09-16 DIAGNOSIS — N183 Chronic kidney disease, stage 3 unspecified: Secondary | ICD-10-CM

## 2017-09-17 ENCOUNTER — Telehealth: Payer: Self-pay | Admitting: *Deleted

## 2017-09-17 LAB — COMPREHENSIVE METABOLIC PANEL
ALBUMIN: 4.4 g/dL (ref 3.5–5.5)
ALT: 16 IU/L (ref 0–44)
AST: 27 IU/L (ref 0–40)
Albumin/Globulin Ratio: 2.1 (ref 1.2–2.2)
Alkaline Phosphatase: 70 IU/L (ref 39–117)
BUN / CREAT RATIO: 14 (ref 9–20)
BUN: 22 mg/dL (ref 6–24)
Bilirubin Total: 0.4 mg/dL (ref 0.0–1.2)
CO2: 28 mmol/L (ref 20–29)
CREATININE: 1.54 mg/dL — AB (ref 0.76–1.27)
Calcium: 9.2 mg/dL (ref 8.7–10.2)
Chloride: 97 mmol/L (ref 96–106)
GFR calc non Af Amer: 49 mL/min/{1.73_m2} — ABNORMAL LOW (ref >59)
GFR, EST AFRICAN AMERICAN: 57 mL/min/{1.73_m2} — AB (ref >59)
GLUCOSE: 88 mg/dL (ref 65–99)
Globulin, Total: 2.1 g/dL (ref 1.5–4.5)
Potassium: 3.6 mmol/L (ref 3.5–5.2)
Sodium: 141 mmol/L (ref 134–144)
TOTAL PROTEIN: 6.5 g/dL (ref 6.0–8.5)

## 2017-09-17 LAB — CBC WITH DIFFERENTIAL/PLATELET
BASOS: 1 %
Basophils Absolute: 0.1 10*3/uL (ref 0.0–0.2)
EOS (ABSOLUTE): 1.2 10*3/uL — ABNORMAL HIGH (ref 0.0–0.4)
EOS: 22 %
HEMATOCRIT: 33.7 % — AB (ref 37.5–51.0)
HEMOGLOBIN: 11.5 g/dL — AB (ref 13.0–17.7)
IMMATURE GRANS (ABS): 0 10*3/uL (ref 0.0–0.1)
IMMATURE GRANULOCYTES: 0 %
LYMPHS ABS: 1.6 10*3/uL (ref 0.7–3.1)
Lymphs: 28 %
MCH: 30.9 pg (ref 26.6–33.0)
MCHC: 34.1 g/dL (ref 31.5–35.7)
MCV: 91 fL (ref 79–97)
MONOCYTES: 10 %
Monocytes Absolute: 0.5 10*3/uL (ref 0.1–0.9)
NEUTROS ABS: 2.2 10*3/uL (ref 1.4–7.0)
Neutrophils: 39 %
Platelets: 211 10*3/uL (ref 150–450)
RBC: 3.72 x10E6/uL — ABNORMAL LOW (ref 4.14–5.80)
RDW: 13.3 % (ref 12.3–15.4)
WBC: 5.5 10*3/uL (ref 3.4–10.8)

## 2017-09-17 LAB — LACTATE DEHYDROGENASE: LDH: 191 IU/L (ref 121–224)

## 2017-09-17 NOTE — Telephone Encounter (Signed)
Pt called / informed "lab stable. Eosinophils stable at 22%" per Dr Beryle Beams. Stated thank you. Checked BP yesterday - 134/71 P-74 per pt's request.

## 2017-09-17 NOTE — Telephone Encounter (Signed)
-----   Message from Annia Belt, MD sent at 09/17/2017  9:02 AM EDT ----- Call pt: lab stable. Eosinophils stable at 22%

## 2017-10-03 ENCOUNTER — Other Ambulatory Visit: Payer: Self-pay | Admitting: Oncology

## 2017-10-03 DIAGNOSIS — I10 Essential (primary) hypertension: Secondary | ICD-10-CM

## 2017-10-28 ENCOUNTER — Other Ambulatory Visit: Payer: Self-pay | Admitting: Oncology

## 2017-11-11 ENCOUNTER — Other Ambulatory Visit (INDEPENDENT_AMBULATORY_CARE_PROVIDER_SITE_OTHER): Payer: BLUE CROSS/BLUE SHIELD

## 2017-11-11 DIAGNOSIS — N183 Chronic kidney disease, stage 3 unspecified: Secondary | ICD-10-CM

## 2017-11-11 DIAGNOSIS — I10 Essential (primary) hypertension: Secondary | ICD-10-CM

## 2017-11-11 DIAGNOSIS — C9211 Chronic myeloid leukemia, BCR/ABL-positive, in remission: Secondary | ICD-10-CM

## 2017-11-12 LAB — COMPREHENSIVE METABOLIC PANEL
ALBUMIN: 4.3 g/dL (ref 3.5–5.5)
ALK PHOS: 72 IU/L (ref 39–117)
ALT: 25 IU/L (ref 0–44)
AST: 28 IU/L (ref 0–40)
Albumin/Globulin Ratio: 2 (ref 1.2–2.2)
BUN / CREAT RATIO: 13 (ref 9–20)
BUN: 18 mg/dL (ref 6–24)
Bilirubin Total: 0.3 mg/dL (ref 0.0–1.2)
CALCIUM: 9.1 mg/dL (ref 8.7–10.2)
CO2: 29 mmol/L (ref 20–29)
CREATININE: 1.42 mg/dL — AB (ref 0.76–1.27)
Chloride: 100 mmol/L (ref 96–106)
GFR calc Af Amer: 62 mL/min/{1.73_m2} (ref >59)
GFR, EST NON AFRICAN AMERICAN: 54 mL/min/{1.73_m2} — AB (ref >59)
GLOBULIN, TOTAL: 2.2 g/dL (ref 1.5–4.5)
Glucose: 93 mg/dL (ref 65–99)
Potassium: 3.3 mmol/L — ABNORMAL LOW (ref 3.5–5.2)
SODIUM: 140 mmol/L (ref 134–144)
Total Protein: 6.5 g/dL (ref 6.0–8.5)

## 2017-11-12 LAB — CBC WITH DIFFERENTIAL/PLATELET
BASOS: 1 %
Basophils Absolute: 0 10*3/uL (ref 0.0–0.2)
EOS (ABSOLUTE): 1.4 10*3/uL — ABNORMAL HIGH (ref 0.0–0.4)
EOS: 23 %
HEMATOCRIT: 34 % — AB (ref 37.5–51.0)
HEMOGLOBIN: 10.9 g/dL — AB (ref 13.0–17.7)
IMMATURE GRANULOCYTES: 0 %
Immature Grans (Abs): 0 10*3/uL (ref 0.0–0.1)
Lymphocytes Absolute: 1.5 10*3/uL (ref 0.7–3.1)
Lymphs: 25 %
MCH: 30 pg (ref 26.6–33.0)
MCHC: 32.1 g/dL (ref 31.5–35.7)
MCV: 94 fL (ref 79–97)
MONOS ABS: 0.6 10*3/uL (ref 0.1–0.9)
Monocytes: 10 %
NEUTROS ABS: 2.5 10*3/uL (ref 1.4–7.0)
NEUTROS PCT: 41 %
Platelets: 179 10*3/uL (ref 150–450)
RBC: 3.63 x10E6/uL — ABNORMAL LOW (ref 4.14–5.80)
RDW: 13.1 % (ref 12.3–15.4)
WBC: 6 10*3/uL (ref 3.4–10.8)

## 2017-11-12 LAB — LACTATE DEHYDROGENASE: LDH: 194 IU/L (ref 121–224)

## 2017-11-13 ENCOUNTER — Other Ambulatory Visit: Payer: Self-pay | Admitting: Oncology

## 2017-11-13 ENCOUNTER — Telehealth: Payer: Self-pay | Admitting: *Deleted

## 2017-11-13 MED ORDER — POTASSIUM CHLORIDE ER 10 MEQ PO TBCR: 20.0000 meq | EXTENDED_RELEASE_TABLET | Freq: Every day | ORAL | 6 refills | Status: DC

## 2017-11-13 NOTE — Telephone Encounter (Signed)
-----   Message from Annia Belt, MD sent at 11/13/2017  8:53 AM EDT ----- Call pt: hemoglobin slightly lower than baseline; eosinophils about the same as last time at 23%; kidney function stable; potassium slighty low from diuretic. I will send in Rx for a one a day potassium supplement.  Leukemia marker pending. Will call when available.

## 2017-11-13 NOTE — Telephone Encounter (Signed)
Pt called / informed "hemoglobin slightly lower than baseline (@10 .9); eosinophils about the same as last time at 23%; kidney function stable; potassium slighty low (@3 .3) from diuretic. I will send in Rx for a one a day potassium supplement. Leukemia marker pending. Will call when available. " per Dr Beryle Beams. Stated ok.

## 2017-11-14 ENCOUNTER — Other Ambulatory Visit: Payer: Self-pay | Admitting: Oncology

## 2017-11-14 LAB — BCR-ABL1, CML/ALL, PCR, QUANT

## 2017-11-18 ENCOUNTER — Telehealth: Payer: Self-pay | Admitting: *Deleted

## 2017-11-18 NOTE — Telephone Encounter (Signed)
Pt called / informed "leukemia marker remains undetectable." per Dr Beryle Beams. Stated he saw results on My Chart. And stated he picked up K+ pills and last SBP was 138 about 10 days ago.

## 2017-11-18 NOTE — Telephone Encounter (Signed)
-----   Message from Annia Belt, MD sent at 11/15/2017 12:50 PM EDT ----- Call CX:[id number] marker remains undetectable. Please send him a copy of the report.

## 2017-11-18 NOTE — Telephone Encounter (Signed)
noted Thanks DrG

## 2017-12-02 ENCOUNTER — Other Ambulatory Visit: Payer: Self-pay | Admitting: Oncology

## 2017-12-31 ENCOUNTER — Telehealth: Payer: Self-pay | Admitting: General Practice

## 2017-12-31 NOTE — Telephone Encounter (Signed)
Pt stated SBP has slightly increase 140 - 143; he stopped taking Norvasc in May, only on Dyazide. Has an appt to see Dr Beryle Beams  next month; lab appt 9/16. Informed Dr Darnell Level is out of town until next week; stated he just wanted him to be aware.

## 2017-12-31 NOTE — Telephone Encounter (Signed)
Pt has been checking blood pressure and is getting high, pt wanted to let Dr Darnell Level know and to see if he wants to do anything about that; pt contact [phone]

## 2017-12-31 NOTE — Telephone Encounter (Signed)
Thank you. Please ask her to monitor her BP but small variations in her BP is normal and I would not be too worried about these numbers. Thank you

## 2018-01-06 ENCOUNTER — Other Ambulatory Visit: Payer: Self-pay | Admitting: Oncology

## 2018-01-07 NOTE — Telephone Encounter (Signed)
Please check w patient: I think this was a one time order & he gets main supply of drug from a specialty pharmacy. Thanks DrG

## 2018-01-08 NOTE — Telephone Encounter (Signed)
Spoke with patient-this request is from [employer] Insurance risk surveyor).Despina Hidden Cassady9/11/20193:21 PM

## 2018-01-13 ENCOUNTER — Other Ambulatory Visit (INDEPENDENT_AMBULATORY_CARE_PROVIDER_SITE_OTHER): Payer: BLUE CROSS/BLUE SHIELD

## 2018-01-13 DIAGNOSIS — I10 Essential (primary) hypertension: Secondary | ICD-10-CM | POA: Diagnosis not present

## 2018-01-13 DIAGNOSIS — N183 Chronic kidney disease, stage 3 unspecified: Secondary | ICD-10-CM

## 2018-01-13 DIAGNOSIS — C9211 Chronic myeloid leukemia, BCR/ABL-positive, in remission: Secondary | ICD-10-CM

## 2018-01-14 LAB — CBC WITH DIFFERENTIAL/PLATELET
BASOS: 1 %
Basophils Absolute: 0.1 10*3/uL (ref 0.0–0.2)
EOS (ABSOLUTE): 1.2 10*3/uL — ABNORMAL HIGH (ref 0.0–0.4)
EOS: 22 %
HEMATOCRIT: 33.2 % — AB (ref 37.5–51.0)
HEMOGLOBIN: 11 g/dL — AB (ref 13.0–17.7)
IMMATURE GRANULOCYTES: 0 %
Immature Grans (Abs): 0 10*3/uL (ref 0.0–0.1)
Lymphocytes Absolute: 1.2 10*3/uL (ref 0.7–3.1)
Lymphs: 22 %
MCH: 30.4 pg (ref 26.6–33.0)
MCHC: 33.1 g/dL (ref 31.5–35.7)
MCV: 92 fL (ref 79–97)
MONOS ABS: 0.6 10*3/uL (ref 0.1–0.9)
Monocytes: 12 %
NEUTROS ABS: 2.3 10*3/uL (ref 1.4–7.0)
NEUTROS PCT: 43 %
Platelets: 155 10*3/uL (ref 150–450)
RBC: 3.62 x10E6/uL — ABNORMAL LOW (ref 4.14–5.80)
RDW: 12 % — ABNORMAL LOW (ref 12.3–15.4)
WBC: 5.4 10*3/uL (ref 3.4–10.8)

## 2018-01-14 LAB — COMPREHENSIVE METABOLIC PANEL
A/G RATIO: 2.1 (ref 1.2–2.2)
ALBUMIN: 4.4 g/dL (ref 3.5–5.5)
ALT: 19 IU/L (ref 0–44)
AST: 27 IU/L (ref 0–40)
Alkaline Phosphatase: 73 IU/L (ref 39–117)
BUN / CREAT RATIO: 12 (ref 9–20)
BUN: 18 mg/dL (ref 6–24)
Bilirubin Total: 0.4 mg/dL (ref 0.0–1.2)
CALCIUM: 9.2 mg/dL (ref 8.7–10.2)
CO2: 29 mmol/L (ref 20–29)
CREATININE: 1.52 mg/dL — AB (ref 0.76–1.27)
Chloride: 97 mmol/L (ref 96–106)
GFR, EST AFRICAN AMERICAN: 58 mL/min/{1.73_m2} — AB (ref >59)
GFR, EST NON AFRICAN AMERICAN: 50 mL/min/{1.73_m2} — AB (ref >59)
GLOBULIN, TOTAL: 2.1 g/dL (ref 1.5–4.5)
Glucose: 88 mg/dL (ref 65–99)
Potassium: 3.8 mmol/L (ref 3.5–5.2)
SODIUM: 140 mmol/L (ref 134–144)
TOTAL PROTEIN: 6.5 g/dL (ref 6.0–8.5)

## 2018-01-14 LAB — LACTATE DEHYDROGENASE: LDH: 198 IU/L (ref 121–224)

## 2018-01-15 ENCOUNTER — Telehealth: Payer: Self-pay | Admitting: *Deleted

## 2018-01-15 NOTE — Telephone Encounter (Signed)
-----   Message from Annia Belt, MD sent at 01/14/2018  9:05 AM EDT ----- Call pt: lab values all stable compared with previous. Eosinophils no change at 22%.

## 2018-01-15 NOTE — Telephone Encounter (Signed)
Pt called / informed "lab values all stable compared with previous. Eosinophils no change at 22%. " per Dr Beryle Beams. Stated he has not had a chance to look at his labs (on My Chart) but he will. Also stated his dentist had checked his BP which was 117/80.

## 2018-01-21 ENCOUNTER — Other Ambulatory Visit: Payer: Self-pay | Admitting: Oncology

## 2018-01-21 DIAGNOSIS — C9211 Chronic myeloid leukemia, BCR/ABL-positive, in remission: Secondary | ICD-10-CM

## 2018-02-09 ENCOUNTER — Other Ambulatory Visit: Payer: Self-pay | Admitting: Oncology

## 2018-02-17 ENCOUNTER — Ambulatory Visit (INDEPENDENT_AMBULATORY_CARE_PROVIDER_SITE_OTHER): Payer: BLUE CROSS/BLUE SHIELD | Admitting: Oncology

## 2018-02-17 ENCOUNTER — Encounter: Payer: Self-pay | Admitting: Oncology

## 2018-02-17 ENCOUNTER — Other Ambulatory Visit: Payer: Self-pay

## 2018-02-17 VITALS — BP 135/76 | HR 80 | Temp 98.3°F | Ht 68.0 in | Wt 153.0 lb

## 2018-02-17 DIAGNOSIS — C9211 Chronic myeloid leukemia, BCR/ABL-positive, in remission: Secondary | ICD-10-CM | POA: Diagnosis not present

## 2018-02-17 DIAGNOSIS — Z8679 Personal history of other diseases of the circulatory system: Secondary | ICD-10-CM

## 2018-02-17 DIAGNOSIS — D721 Eosinophilia: Secondary | ICD-10-CM

## 2018-02-17 DIAGNOSIS — Z23 Encounter for immunization: Secondary | ICD-10-CM | POA: Diagnosis not present

## 2018-02-17 DIAGNOSIS — I10 Essential (primary) hypertension: Secondary | ICD-10-CM

## 2018-02-17 DIAGNOSIS — T50905A Adverse effect of unspecified drugs, medicaments and biological substances, initial encounter: Secondary | ICD-10-CM

## 2018-02-17 DIAGNOSIS — Z79899 Other long term (current) drug therapy: Secondary | ICD-10-CM

## 2018-02-17 MED ORDER — TRIAMTERENE-HCTZ 37.5-25 MG PO CAPS: ORAL_CAPSULE | ORAL | 0 refills | Status: DC

## 2018-02-17 NOTE — Progress Notes (Signed)
Let me know when you want me to see him

## 2018-02-17 NOTE — Progress Notes (Signed)
Hematology and Oncology Follow Up Visit  Patrick Nash [id number] 1960/08/18 57 y.o. 02/17/2018 11:24 AM   Principle Diagnosis: Encounter Diagnoses  Name Primary?  . HTN (hypertension), benign Yes  . 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 Drug-induced eosinophilia   . 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 . He has now been on [place] for almost 14years and remains in a molecular remission. 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 He was initially treated with Gleevec at 400 mg daily, subsequently escalated to 800 mg, and then over time, decreased to current dose of 400 mg. He achieved a rapid initial major molecular response. He has been able to maintain this over time despite dose reduction.  In view of recent data showing that about 50% of patients who have been in a long-term molecular remission will stay in remission if imatinib or other second generation tyrosine kinase inhibitors are stopped, I gave him a trial off drug subsequent to his April 2017 visit. A bcr-abl analysis done on 10/17/2015, it did show a rise in his transcript level of 3 logs although he was still at the 3 logs below baseline mark.Gleevec was resumedand he has regained a deep molecular remission. He developed a persistent eosinophilia with eosinophils up to 27% of the white count differential.  I consulted to colleagues, Dr. Sindy Guadeloupe, and eosinophilia expert at the [facility] and Dr Binnie Kand, chief, myeloproliferative disorders at [facility] in [place].  See my office note dictated August 13, 2017. I stop the patient's amlodipine.  Screen for underlying allergic disorders negative.  We did uncover occult B12 deficiency.  Neither expert recommended discontinuing or changing the  imatinib.  Interim History: Overall stable.  Increasing difficulty caring for his mother who had to be put into assisted living.  Mental function is declining.  He got frustrated and slammed his fist into a wall at home.  He thinks he might of fractured 1 of the fingers on his right hand. He is keeping a record of his blood pressure off the amlodipine.  He continues on Maxide.  Blood pressures have been in good range and we do not need to resume the amlodipine. He is interested in having a lipid profile checked.  Medications: reviewed  Allergies: No Known Allergies  Review of Systems: See interim history Remaining ROS negative:   Physical Exam: Blood pressure 135/76, pulse 80, temperature 98.3 F (36.8 C), temperature source Oral, height 5\' 8"  (1.727 m), weight 153 lb (69.4 kg), SpO2 100 %. Wt Readings from Last 3 Encounters:  02/17/18 153 lb (69.4 kg)  07/29/17 152 lb 8 oz (69.2 kg)  03/18/17 152 lb 3.2 oz (69 kg)     General appearance: Well-nourished Caucasian man HENNT: Male pattern baldness, pharynx no erythema, exudate, mass, or ulcer. No thyromegaly or thyroid nodules Lymph nodes: No cervical, supraclavicular, or axillary lymphadenopathy Breasts:  Lungs: Clear to auscultation, resonant to percussion throughout Heart: Regular rhythm, no murmur, no gallop, no rub, no click, no edema Abdomen: Soft, nontender, normal bowel sounds, no mass, no organomegaly Extremities: . No edema, no calf tenderness. Musculoskeletal: no joint deformities.  Slight ecchymosis lateral aspect right hand.  Good joint mobility.  Doubt fracture. GU:  Vascular: Carotid pulses 2+, no bruits, distal pulses: Dorsalis pedis 1+ symmetric Neurologic: Alert, oriented, PERRLA, ,  cranial nerves grossly normal, motor strength 5 over 5, reflexes 1+ symmetric at the biceps, 3+ at the right patella, 2+ left patella upper body coordination normal, gait normal, Skin: No rash or ecchymosis  Lab Results: CBC W/Diff     Component Value Date/Time   WBC 5.4 01/13/2018 1015   WBC 5.6 09/09/2017 0938   RBC 3.62 (L) 01/13/2018 1015   RBC 3.67 (L) 09/09/2017 0938   HGB 11.0 (L) 01/13/2018 1015   HGB 12.2 (L) 06/01/2016 1025   HCT 33.2 (L) 01/13/2018 1015   HCT 35.0 (L) 06/01/2016 1025   PLT 155 01/13/2018 1015   MCV 92 01/13/2018 1015   MCV 87.7 06/01/2016 1025   MCH 30.4 01/13/2018 1015   MCH 30.5 09/09/2017 0938   MCHC 33.1 01/13/2018 1015   MCHC 32.8 09/09/2017 0938   RDW 12.0 (L) 01/13/2018 1015   RDW 12.4 06/01/2016 1025   LYMPHSABS 1.2 01/13/2018 1015   LYMPHSABS 1.6 06/01/2016 1025   MONOABS 0.5 09/09/2017 0938   MONOABS 0.5 06/01/2016 1025   EOSABS 1.2 (H) 01/13/2018 1015   BASOSABS 0.1 01/13/2018 1015   BASOSABS 0.1 06/01/2016 1025     Chemistry      Component Value Date/Time   NA 140 01/13/2018 1015   NA 140 06/01/2016 1025   K 3.8 01/13/2018 1015   K 3.5 06/01/2016 1025   CL 97 01/13/2018 1015   CL 103 09/12/2012 1119   CO2 29 01/13/2018 1015   CO2 31 (H) 06/01/2016 1025   BUN 18 01/13/2018 1015   BUN 22.8 06/01/2016 1025   CREATININE 1.52 (H) 01/13/2018 1015   CREATININE 1.5 (H) 06/01/2016 1025      Component Value Date/Time   CALCIUM 9.2 01/13/2018 1015   CALCIUM 9.2 06/01/2016 1025   ALKPHOS 73 01/13/2018 1015   ALKPHOS 90 06/01/2016 1025   AST 27 01/13/2018 1015   AST 23 06/01/2016 1025   ALT 19 01/13/2018 1015   ALT 18 06/01/2016 1025   BILITOT 0.4 01/13/2018 1015   BILITOT 0.47 06/01/2016 1025       Radiological Studies: No results found.  Impression: 1.  CML now out almost 15 years from diagnosis in December 2004. Deep molecular remission on imatinib alone. Failed trial off drug. Back in molecular remission with most recent assessment November 11, 2017. Plan: Continue imatinib indefinitely  I will transition his hematology care to Dr. Julieanne Manson at the [facility] after April 30, 2018.  2.  Essential hypertension Controlled on single  agent Maxide with additional potassium supplement  3.  Chronic eosinophilia See discussion above.  Likely is an effect of the imatinib.  Stable with no trend for further elevation.  4.  History of an isolated syncopal episode with no recurrence  5.  History of intermittent dysphagia for solid foods. Barium swallow ordered but patient symptoms resolved and the study was not done.   CC: Patient Care Team: Patient, No Pcp Per as PCP - General (General Practice)   Murriel Hopper, MD, FACP  Hematology-Oncology/Internal Medicine     10/21/201911:24 AM

## 2018-02-17 NOTE — Patient Instructions (Addendum)
Continue every 2 month labs next v due 11/18; add lipid profile on 11/18 MD visit 4 months  New Hematology referral made to Dr Julieanne Manson after Apr 30, 2018 at [facility] center Phone [phone]

## 2018-03-17 ENCOUNTER — Other Ambulatory Visit (INDEPENDENT_AMBULATORY_CARE_PROVIDER_SITE_OTHER): Payer: BLUE CROSS/BLUE SHIELD

## 2018-03-17 DIAGNOSIS — N183 Chronic kidney disease, stage 3 unspecified: Secondary | ICD-10-CM

## 2018-03-17 DIAGNOSIS — C9211 Chronic myeloid leukemia, BCR/ABL-positive, in remission: Secondary | ICD-10-CM

## 2018-03-17 DIAGNOSIS — I10 Essential (primary) hypertension: Secondary | ICD-10-CM | POA: Diagnosis not present

## 2018-03-18 LAB — COMPREHENSIVE METABOLIC PANEL
A/G RATIO: 2 (ref 1.2–2.2)
ALBUMIN: 4.5 g/dL (ref 3.5–5.5)
ALK PHOS: 81 IU/L (ref 39–117)
ALT: 25 IU/L (ref 0–44)
AST: 33 IU/L (ref 0–40)
BILIRUBIN TOTAL: 0.4 mg/dL (ref 0.0–1.2)
BUN / CREAT RATIO: 14 (ref 9–20)
BUN: 20 mg/dL (ref 6–24)
CO2: 26 mmol/L (ref 20–29)
CREATININE: 1.44 mg/dL — AB (ref 0.76–1.27)
Calcium: 9.6 mg/dL (ref 8.7–10.2)
Chloride: 97 mmol/L (ref 96–106)
GFR calc Af Amer: 61 mL/min/{1.73_m2} (ref >59)
GFR calc non Af Amer: 53 mL/min/{1.73_m2} — ABNORMAL LOW (ref >59)
Globulin, Total: 2.2 g/dL (ref 1.5–4.5)
Glucose: 90 mg/dL (ref 65–99)
POTASSIUM: 3.6 mmol/L (ref 3.5–5.2)
SODIUM: 139 mmol/L (ref 134–144)
Total Protein: 6.7 g/dL (ref 6.0–8.5)

## 2018-03-18 LAB — CBC WITH DIFFERENTIAL/PLATELET
Basophils Absolute: 0.1 10*3/uL (ref 0.0–0.2)
Basos: 1 %
EOS (ABSOLUTE): 1.1 10*3/uL — ABNORMAL HIGH (ref 0.0–0.4)
EOS: 20 %
HEMATOCRIT: 34.8 % — AB (ref 37.5–51.0)
HEMOGLOBIN: 11.7 g/dL — AB (ref 13.0–17.7)
Immature Grans (Abs): 0 10*3/uL (ref 0.0–0.1)
Immature Granulocytes: 0 %
Lymphocytes Absolute: 1.3 10*3/uL (ref 0.7–3.1)
Lymphs: 24 %
MCH: 30.6 pg (ref 26.6–33.0)
MCHC: 33.6 g/dL (ref 31.5–35.7)
MCV: 91 fL (ref 79–97)
MONOS ABS: 0.6 10*3/uL (ref 0.1–0.9)
Monocytes: 10 %
Neutrophils Absolute: 2.4 10*3/uL (ref 1.4–7.0)
Neutrophils: 45 %
Platelets: 173 10*3/uL (ref 150–450)
RBC: 3.82 x10E6/uL — ABNORMAL LOW (ref 4.14–5.80)
RDW: 11.9 % — AB (ref 12.3–15.4)
WBC: 5.4 10*3/uL (ref 3.4–10.8)

## 2018-03-18 LAB — LIPID PANEL
CHOL/HDL RATIO: 2.7 ratio (ref 0.0–5.0)
CHOLESTEROL TOTAL: 138 mg/dL (ref 100–199)
HDL: 51 mg/dL (ref >39)
LDL CALC: 71 mg/dL (ref 0–99)
TRIGLYCERIDES: 79 mg/dL (ref 0–149)
VLDL CHOLESTEROL CAL: 16 mg/dL (ref 5–40)

## 2018-03-18 LAB — LACTATE DEHYDROGENASE: LDH: 218 IU/L (ref 121–224)

## 2018-03-19 ENCOUNTER — Telehealth: Payer: Self-pay | Admitting: *Deleted

## 2018-03-19 NOTE — Telephone Encounter (Signed)
-----   Message from Annia Belt, MD sent at 03/18/2018 10:45 AM EST ----- Call pt: lab all good. Normal cholesterol. Kidney function stable.

## 2018-03-19 NOTE — Telephone Encounter (Signed)
Called pt - no answer; left message to give me a call back. 

## 2018-03-20 NOTE — Telephone Encounter (Signed)
Called pt - stated he has already seen lab results; informed "lab all good. Normal cholesterol. Kidney function stable" per Dr Beryle Beams.

## 2018-03-21 LAB — BCR-ABL1, CML/ALL, PCR, QUANT

## 2018-03-24 ENCOUNTER — Telehealth: Payer: Self-pay | Admitting: *Deleted

## 2018-03-24 NOTE — Telephone Encounter (Signed)
-----   Message from Annia Belt, MD sent at 03/24/2018  1:34 PM EST ----- Please send copy to patient

## 2018-03-24 NOTE — Telephone Encounter (Signed)
Copy of pt's current labs mailed to pt per Dr Azucena Freed request.

## 2018-05-03 ENCOUNTER — Other Ambulatory Visit: Payer: Self-pay | Admitting: Oncology

## 2018-05-05 NOTE — Telephone Encounter (Signed)
Next appt scheduled 1/28 with Dr Darnell Level.

## 2018-05-15 ENCOUNTER — Telehealth: Payer: Self-pay | Admitting: *Deleted

## 2018-05-15 NOTE — Telephone Encounter (Signed)
Patient left VM that he is seeing Dr. Beryle Beams and he mentioned following up with Dr. Benay Spice. Asking if they have discuss his case yet? Noted that patient has an appointment with Dr. Beryle Beams on 05/27/18.

## 2018-05-26 ENCOUNTER — Encounter: Payer: Self-pay | Admitting: *Deleted

## 2018-05-27 ENCOUNTER — Encounter: Payer: Self-pay | Admitting: Oncology

## 2018-05-27 ENCOUNTER — Ambulatory Visit: Payer: BLUE CROSS/BLUE SHIELD | Admitting: Oncology

## 2018-05-27 ENCOUNTER — Other Ambulatory Visit: Payer: Self-pay

## 2018-05-27 VITALS — BP 137/69 | HR 72 | Temp 97.7°F | Ht 68.0 in | Wt 155.4 lb

## 2018-05-27 DIAGNOSIS — I10 Essential (primary) hypertension: Secondary | ICD-10-CM | POA: Diagnosis not present

## 2018-05-27 DIAGNOSIS — C9211 Chronic myeloid leukemia, BCR/ABL-positive, in remission: Secondary | ICD-10-CM | POA: Diagnosis not present

## 2018-05-27 DIAGNOSIS — E538 Deficiency of other specified B group vitamins: Secondary | ICD-10-CM | POA: Diagnosis not present

## 2018-05-27 DIAGNOSIS — T50905A Adverse effect of unspecified drugs, medicaments and biological substances, initial encounter: Secondary | ICD-10-CM | POA: Diagnosis not present

## 2018-05-27 DIAGNOSIS — R1310 Dysphagia, unspecified: Secondary | ICD-10-CM

## 2018-05-27 DIAGNOSIS — D721 Eosinophilia, unspecified: Secondary | ICD-10-CM

## 2018-05-27 DIAGNOSIS — Z79899 Other long term (current) drug therapy: Secondary | ICD-10-CM

## 2018-05-27 NOTE — Patient Instructions (Signed)
To lab today Return to lab on 07/15/18 for cbc & bcr-abl analysis

## 2018-05-27 NOTE — Progress Notes (Signed)
Hematology and Oncology Follow Up Visit  Patrick Nash [id number] [birth year] 58 y.o. 05/27/2018 10:45 AM   Principle Diagnosis: Encounter Diagnoses  Name Primary?  . 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 HTN (hypertension), benign   . Dysphagia, unspecified type   . 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 . He has now been on [place] for over [place] and remains in a molecular remission. 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 He was initially treated with Gleevec at 400 mg daily, subsequently escalated to 800 mg, and then over time, decreased to current dose of 400 mg. He achieved a rapid initial major molecular response. He has been able to maintain this over time despite dose reduction.  In view of recent data showing that about 50% of patients who have been in a long-term molecular remission will stay in remission if imatinib or other second generation tyrosine kinase inhibitors are stopped, I gave him a trial off drug subsequent to his April 2017 visit. A bcr-abl analysis done on 10/17/2015, it did show a rise in his transcript level of 3 logs although he was still at the 3 logs below baseline mark.Gleevec was resumedand he has regained a deep molecular remission. He developed a persistent eosinophilia with eosinophils up to 27% of the white count differential.  I consulted to colleagues, Dr. Sindy Guadeloupe, an eosinophilia expert at the [facility] and Dr Binnie Kand, chief, myeloproliferative disorders at [facility] in [place].  See my office note dictated August 13, 2017. I stop the patient's amlodipine.  Screen for underlying allergic disorders negative.  We did uncover occult B12 deficiency.  Neither expert recommended discontinuing or changing the imatinib.  Interim  History: Short interval follow-up visit.  Last seen in October 2019.  No acute medical issues in the interim.  Blood pressure reasonably well controlled off amlodipine and just on a diuretic. He still reports occasional difficulty swallowing.  Medications: reviewed  Allergies: No Known Allergies  Review of Systems: See interim history Remaining ROS negative:   Physical Exam: Blood pressure 137/69, pulse 72, temperature 97.7 F (36.5 C), temperature source Oral, height 5\' 8"  (1.727 m), weight 155 lb 6.4 oz (70.5 kg), SpO2 100 %. Wt Readings from Last 3 Encounters:  05/27/18 155 lb 6.4 oz (70.5 kg)  02/17/18 153 lb (69.4 kg)  07/29/17 152 lb 8 oz (69.2 kg)     General appearance: Well-nourished Caucasian man.  Chronically pale skin. HENNT: Male pattern baldness.  Pharynx no erythema, exudate, mass, or ulcer. No thyromegaly or thyroid nodules Lymph nodes: No cervical, supraclavicular, or axillary lymphadenopathy Breasts: Lungs: Clear to auscultation, resonant to percussion throughout Heart: Regular rhythm, no murmur, no gallop, no rub, no click, no edema Abdomen: Soft, nontender, normal bowel sounds, no mass, no organomegaly Extremities: No edema, no calf tenderness Musculoskeletal: no joint deformities GU:  Vascular: Carotid pulses 2+, no bruits, Neurologic: Alert, oriented, PERRLA, cranial nerves grossly normal, motor strength 5 over 5, reflexes 1+ symmetric at the biceps, 3+ at the right patella, 2+ left patella, upper body coordination normal, gait normal, Skin: No rash or ecchymosis  Lab Results: CBC W/Diff    Component Value Date/Time   WBC 5.4 03/17/2018 0958   WBC 5.6 09/09/2017 0938   RBC 3.82 (L) 03/17/2018 0958   RBC 3.67 (L) 09/09/2017 0938   HGB 11.7 (  L) 03/17/2018 0958   HGB 12.2 (L) 06/01/2016 1025   HCT 34.8 (L) 03/17/2018 0958   HCT 35.0 (L) 06/01/2016 1025   PLT 173 03/17/2018 0958   MCV 91 03/17/2018 0958   MCV 87.7 06/01/2016 1025   MCH 30.6  03/17/2018 0958   MCH 30.5 09/09/2017 0938   MCHC 33.6 03/17/2018 0958   MCHC 32.8 09/09/2017 0938   RDW 11.9 (L) 03/17/2018 0958   RDW 12.4 06/01/2016 1025   LYMPHSABS 1.3 03/17/2018 0958   LYMPHSABS 1.6 06/01/2016 1025   MONOABS 0.5 09/09/2017 0938   MONOABS 0.5 06/01/2016 1025   EOSABS 1.1 (H) 03/17/2018 0958   BASOSABS 0.1 03/17/2018 0958   BASOSABS 0.1 06/01/2016 1025     Chemistry      Component Value Date/Time   NA 139 03/17/2018 0958   NA 140 06/01/2016 1025   K 3.6 03/17/2018 0958   K 3.5 06/01/2016 1025   CL 97 03/17/2018 0958   CL 103 09/12/2012 1119   CO2 26 03/17/2018 0958   CO2 31 (H) 06/01/2016 1025   BUN 20 03/17/2018 0958   BUN 22.8 06/01/2016 1025   CREATININE 1.44 (H) 03/17/2018 0958   CREATININE 1.5 (H) 06/01/2016 1025      Component Value Date/Time   CALCIUM 9.6 03/17/2018 0958   CALCIUM 9.2 06/01/2016 1025   ALKPHOS 81 03/17/2018 0958   ALKPHOS 90 06/01/2016 1025   AST 33 03/17/2018 0958   AST 23 06/01/2016 1025   ALT 25 03/17/2018 0958   ALT 18 06/01/2016 1025   BILITOT 0.4 03/17/2018 0958   BILITOT 0.47 06/01/2016 1025       Radiological Studies: No results found.  Impression:  1.  CML in ongoing molecular remission I am back to checking his bcr-abl transcript levels every 4 months.  Next due in March 2020.  He will continue on indefinite imatinib.  See discussion above in the HPI. I will transition his hematology care to Dr. Julieanne Manson at this time.  2.  Essential hypertension Controlled on triamterene hydrochlorothiazide with the need for additional potassium supplementation 20 mEq daily.  CC: Patient Care Team: Patient, No Pcp Per as PCP - General (General Practice)   Murriel Hopper, MD, FACP  Hematology-Oncology/Internal Medicine     1/28/202010:45 AM

## 2018-05-28 ENCOUNTER — Telehealth: Payer: Self-pay | Admitting: *Deleted

## 2018-05-28 LAB — CBC WITH DIFFERENTIAL/PLATELET
Basophils Absolute: 0.1 10*3/uL (ref 0.0–0.2)
Basos: 1 %
EOS (ABSOLUTE): 1.4 10*3/uL — ABNORMAL HIGH (ref 0.0–0.4)
Eos: 23 %
Hematocrit: 35 % — ABNORMAL LOW (ref 37.5–51.0)
Hemoglobin: 11.5 g/dL — ABNORMAL LOW (ref 13.0–17.7)
Immature Grans (Abs): 0 10*3/uL (ref 0.0–0.1)
Immature Granulocytes: 0 %
Lymphocytes Absolute: 1.6 10*3/uL (ref 0.7–3.1)
Lymphs: 26 %
MCH: 30 pg (ref 26.6–33.0)
MCHC: 32.9 g/dL (ref 31.5–35.7)
MCV: 91 fL (ref 79–97)
MONOCYTES: 10 %
Monocytes Absolute: 0.6 10*3/uL (ref 0.1–0.9)
NEUTROS ABS: 2.5 10*3/uL (ref 1.4–7.0)
Neutrophils: 40 %
Platelets: 201 10*3/uL (ref 150–450)
RBC: 3.83 x10E6/uL — ABNORMAL LOW (ref 4.14–5.80)
RDW: 11.9 % (ref 11.6–15.4)
WBC: 6.2 10*3/uL (ref 3.4–10.8)

## 2018-05-28 LAB — COMPREHENSIVE METABOLIC PANEL
ALT: 21 IU/L (ref 0–44)
AST: 31 IU/L (ref 0–40)
Albumin/Globulin Ratio: 2.4 — ABNORMAL HIGH (ref 1.2–2.2)
Albumin: 4.7 g/dL (ref 3.8–4.9)
Alkaline Phosphatase: 73 IU/L (ref 39–117)
BUN/Creatinine Ratio: 11 (ref 9–20)
BUN: 17 mg/dL (ref 6–24)
Bilirubin Total: 0.4 mg/dL (ref 0.0–1.2)
CO2: 26 mmol/L (ref 20–29)
Calcium: 9.5 mg/dL (ref 8.7–10.2)
Chloride: 98 mmol/L (ref 96–106)
Creatinine, Ser: 1.55 mg/dL — ABNORMAL HIGH (ref 0.76–1.27)
GFR calc non Af Amer: 49 mL/min/{1.73_m2} — ABNORMAL LOW (ref >59)
GFR, EST AFRICAN AMERICAN: 56 mL/min/{1.73_m2} — AB (ref >59)
Globulin, Total: 2 g/dL (ref 1.5–4.5)
Glucose: 95 mg/dL (ref 65–99)
Potassium: 3.7 mmol/L (ref 3.5–5.2)
Sodium: 140 mmol/L (ref 134–144)
TOTAL PROTEIN: 6.7 g/dL (ref 6.0–8.5)

## 2018-05-28 LAB — LACTATE DEHYDROGENASE: LDH: 219 IU/L (ref 121–224)

## 2018-05-28 NOTE — Telephone Encounter (Signed)
Pt called / informed "routine labs stable and similar to previous " per Dr Beryle Beams. Stated he had seen results (on My Chart) - noticed increase creatinine level.

## 2018-05-28 NOTE — Telephone Encounter (Signed)
-----   Message from Annia Belt, MD sent at 05/28/2018  7:55 AM EST ----- Call pt: routine labs stable and similar to previous

## 2018-07-15 ENCOUNTER — Other Ambulatory Visit: Payer: BLUE CROSS/BLUE SHIELD

## 2018-07-16 ENCOUNTER — Encounter: Payer: Self-pay | Admitting: Oncology

## 2018-07-16 NOTE — Progress Notes (Unsigned)
Phone conversation with patient AM 07/14/18:    In the 14 days prior to your symptom onset, did you travel to an area with a high prevalence of COVID? {[id number]::"No",  In the 14 days prior to your symptom onset, did you have close contact with someone with COVID? {[id number]::"No" Do you have a fever? {[id number]::"No","  Do you have a cough? {[id number]::Yes - non-productive"} Do you have shortness of breath more than normal? {[id number]::"No",  Do you have chest pain?{[id number]:3044014::"No" Are you able to eat and drink normally? {NAMES:3044014::"Yes but not as much",no Have you seen a physician for these symptoms? {[id number]:3044014::"No",  Does patient need flu / RVP testing? {NAMES:3044014::"No", Does patient need COVID testing?  {NAMES:3044014::"No"  I have reviewed the patients PMHx and medications.  Patient's clinical status appears to be {[id number]::"Mild",  Patient has been instructed to {NAMES:3044014::"Stay at home and provided general health advice" Call if symptoms worsen then come in for Influenza testing

## 2018-07-21 ENCOUNTER — Telehealth: Payer: Self-pay | Admitting: Oncology

## 2018-07-21 ENCOUNTER — Encounter: Payer: Self-pay | Admitting: Oncology

## 2018-07-21 NOTE — Telephone Encounter (Signed)
A new patient appt has been scheduled for the pt to see Dr. Benay Spice on 7/16 at 2pm. Letter mailed.

## 2018-08-12 ENCOUNTER — Other Ambulatory Visit: Payer: Self-pay | Admitting: Oncology

## 2018-08-18 ENCOUNTER — Other Ambulatory Visit: Payer: Self-pay | Admitting: *Deleted

## 2018-08-18 MED ORDER — TRIAMTERENE-HCTZ 37.5-25 MG PO CAPS: ORAL_CAPSULE | ORAL | 0 refills | Status: DC

## 2018-08-18 NOTE — Progress Notes (Signed)
Patient called requesting refill on his Dyazide. Dr. Benay Spice prefers his PCP to refill this, but agrees to refill today and will discuss at new patient appointment on 11/13/2018.

## 2018-11-10 ENCOUNTER — Other Ambulatory Visit: Payer: Self-pay | Admitting: Oncology

## 2018-11-12 ENCOUNTER — Other Ambulatory Visit: Payer: Self-pay

## 2018-11-12 DIAGNOSIS — N183 Chronic kidney disease, stage 3 unspecified: Secondary | ICD-10-CM

## 2018-11-13 ENCOUNTER — Telehealth: Payer: Self-pay | Admitting: Oncology

## 2018-11-13 ENCOUNTER — Inpatient Hospital Stay: Payer: BC Managed Care – PPO | Attending: Oncology | Admitting: Oncology

## 2018-11-13 ENCOUNTER — Other Ambulatory Visit: Payer: Self-pay

## 2018-11-13 ENCOUNTER — Telehealth: Payer: Self-pay | Admitting: *Deleted

## 2018-11-13 ENCOUNTER — Inpatient Hospital Stay: Payer: BC Managed Care – PPO

## 2018-11-13 VITALS — BP 152/80 | HR 88 | Temp 98.5°F | Resp 17 | Ht 68.0 in | Wt 153.4 lb

## 2018-11-13 DIAGNOSIS — C9111 Chronic lymphocytic leukemia of B-cell type in remission: Secondary | ICD-10-CM | POA: Diagnosis not present

## 2018-11-13 DIAGNOSIS — C9211 Chronic myeloid leukemia, BCR/ABL-positive, in remission: Secondary | ICD-10-CM | POA: Diagnosis not present

## 2018-11-13 DIAGNOSIS — N183 Chronic kidney disease, stage 3 unspecified: Secondary | ICD-10-CM

## 2018-11-13 LAB — CBC WITH DIFFERENTIAL (CANCER CENTER ONLY)
Abs Immature Granulocytes: 0.01 10*3/uL (ref 0.00–0.07)
Basophils Absolute: 0.1 10*3/uL (ref 0.0–0.1)
Basophils Relative: 1 %
Eosinophils Absolute: 1.3 10*3/uL — ABNORMAL HIGH (ref 0.0–0.5)
Eosinophils Relative: 22 %
HCT: 34 % — ABNORMAL LOW (ref 39.0–52.0)
Hemoglobin: 11.4 g/dL — ABNORMAL LOW (ref 13.0–17.0)
Immature Granulocytes: 0 %
Lymphocytes Relative: 29 %
Lymphs Abs: 1.7 10*3/uL (ref 0.7–4.0)
MCH: 30.6 pg (ref 26.0–34.0)
MCHC: 33.5 g/dL (ref 30.0–36.0)
MCV: 91.2 fL (ref 80.0–100.0)
Monocytes Absolute: 0.6 10*3/uL (ref 0.1–1.0)
Monocytes Relative: 10 %
Neutro Abs: 2.2 10*3/uL (ref 1.7–7.7)
Neutrophils Relative %: 38 %
Platelet Count: 173 10*3/uL (ref 150–400)
RBC: 3.73 MIL/uL — ABNORMAL LOW (ref 4.22–5.81)
RDW: 12.3 % (ref 11.5–15.5)
WBC Count: 5.8 10*3/uL (ref 4.0–10.5)
nRBC: 0 % (ref 0.0–0.2)

## 2018-11-13 LAB — CMP (CANCER CENTER ONLY)
ALT: 17 U/L (ref 0–44)
AST: 25 U/L (ref 15–41)
Albumin: 4.2 g/dL (ref 3.5–5.0)
Alkaline Phosphatase: 68 U/L (ref 38–126)
Anion gap: 9 (ref 5–15)
BUN: 16 mg/dL (ref 6–20)
CO2: 30 mmol/L (ref 22–32)
Calcium: 8.6 mg/dL — ABNORMAL LOW (ref 8.9–10.3)
Chloride: 99 mmol/L (ref 98–111)
Creatinine: 1.44 mg/dL — ABNORMAL HIGH (ref 0.61–1.24)
GFR, Est AFR Am: >60 mL/min (ref >60)
GFR, Estimated: 53 mL/min — ABNORMAL LOW (ref >60)
Glucose, Bld: 97 mg/dL (ref 70–99)
Potassium: 3.8 mmol/L (ref 3.5–5.1)
Sodium: 138 mmol/L (ref 135–145)
Total Bilirubin: 0.6 mg/dL (ref 0.3–1.2)
Total Protein: 7.1 g/dL (ref 6.5–8.1)

## 2018-11-13 MED ORDER — VITAMIN B-12 1000 MCG PO TABS: 1000.0000 ug | ORAL_TABLET | Freq: Every day | ORAL | Status: AC

## 2018-11-13 MED ORDER — POTASSIUM CHLORIDE CRYS ER 10 MEQ PO TBCR: 20.0000 meq | EXTENDED_RELEASE_TABLET | Freq: Every day | ORAL | 2 refills | Status: DC

## 2018-11-13 NOTE — Telephone Encounter (Signed)
Scheduled appt per 7/16 los - gave patient AVS and calender per los.  

## 2018-11-13 NOTE — Progress Notes (Signed)
[facility] New Patient Consult   Requesting MD: Murriel Hopper   Able Malloy 58 y.o.  02/04/1961    Reason for Consult: CML   HPI: Mr. Buchberger was diagnosed with CML in 2004.  He has been followed by Dr. Beryle Beams and maintained on [place].  He entered molecular remission.  He was taken off of [place] in April 2017, but in June 2017 the BCR-ABL was higher.  Gleevec was resumed.  He has remained in molecular remission.  The last peripheral blood BCR-ABL from November 2019 was negative.  He reports feeling well.  He is transferring his care due to the retirement of Dr. Beryle Beams.  He reports a mild cough beginning in March of this year.  The cough is nonproductive.  No associated symptoms.  He measures has checked his temperature twice daily since March and reports occasional low-grade fever.  No rash, diarrhea, or swelling.  Past Medical History:  Diagnosis Date  . CKD (chronic kidney disease) stage 3, GFR 30-59 ml/min (HCC) 03/18/2017  . 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 Drug-induced eosinophilia 07/29/2017   15%  11/16; up to 27% 3/19; suspect chronic imatinib use  . Dysphagia 12/12/2015  . HTN (hypertension), benign 12/12/2015  . Leukemia ([place]) 2004   CML  . Tendonitis of shoulder 10/10/2012   Left shoulder Following heavy lifting   Past surgical history: None Medications: Reviewed  Allergies: No Known Allergies  Family history: His maternal grandfather died of lung cancer.  Social History:   He lives alone in [place].  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  He does not use cigarettes.  He quit drinking alcohol in 2006.  No transfusion history.  No risk factor for HIV or hepatitis.  ROS:   Positives include: Intermittent nonproductive cough, nausea when he was splitting the 400 mg Gleevec pills in the remote past.  A complete ROS was otherwise negative.  Physical Exam:  Blood pressure (!) 152/80, pulse 88, temperature  98.5 F (36.9 C), temperature source Oral, resp. rate 17, height 5\' 8"  (1.727 m), weight 153 lb 7 oz (69.6 kg), SpO2 99 %. Limited physical examination secondary to distancing with the COVID pandemic Lungs: Clear bilaterally, no respiratory distress Cardiac: Regular rate and rhythm, 2/6 systolic murmur heard loudest at the lower left sternal border Abdomen: No hepatosplenomegaly  Vascular: No leg edema Skin: No rash    LAB:  CBC  Lab Results  Component Value Date   WBC 5.8 11/13/2018   HGB 11.4 (L) 11/13/2018   HCT 34.0 (L) 11/13/2018   MCV 91.2 11/13/2018   PLT 173 11/13/2018   NEUTROABS 2.2 11/13/2018        CMP  Lab Results  Component Value Date   NA 138 11/13/2018   K 3.8 11/13/2018   CL 99 11/13/2018   CO2 30 11/13/2018   GLUCOSE 97 11/13/2018   BUN 16 11/13/2018   CREATININE 1.44 (H) 11/13/2018   CALCIUM 8.6 (L) 11/13/2018   PROT 7.1 11/13/2018   ALBUMIN 4.2 11/13/2018   AST 25 11/13/2018   ALT 17 11/13/2018   ALKPHOS 68 11/13/2018   BILITOT 0.6 11/13/2018   GFRNONAA 53 (L) 11/13/2018   GFRAA >60 11/13/2018      Assessment/Plan:   1. CML diagnosed in 2004  Initial molecular remission with Gleevec  Gleevec discontinued April 2017, resumed in June 2017 after a rise in the peripheral blood BCR-ABL  Gleevec resumed and he again entered molecular remission 2. Hypertension  3. Chronic renal insufficiency 4. Chronic eosinophilia 5. Systolic heart murmur noted on exam 11/13/2018   Disposition:   Mr. Balogh has a history of CML dating to 2004.  He is in clinical remission while on Gleevec.  He appears to be in hematologic remission.  We will follow-up on the peripheral blood BCR-ABL from today.  He will continue Gleevec at a dose of 400 mg daily.  He has chronic mild anemia, likely secondary to [place].  I recommended he obtain a primary physician for management of hypertension and renal insufficiency.  Also recommended a cardiology referral to  evaluate the heart murmur.  He does not wish to see other providers at present.  He will consider this when the [place] pandemic has improved.  He will return for a lab visit in 3 months and an office visit in 6 months.  The vitamin B12 level was low in April 2019.  We will be sure he is taking vitamin B12 replacement.  Betsy Coder, MD  11/13/2018, 3:10 PM

## 2018-11-13 NOTE — Telephone Encounter (Signed)
Informed patient that Dyazide was refilled. He is asking for his K+ to be refilled as well (OK per Dr. Benay Spice). Informed him that his last vitamin B12 lab was low and he should resume his B12 at 1000 mcg daily. This can be obtained OTC. Attempted to add B12 level today to labs, but per lab, not able to do so. Will collect at his lab appointment in October.

## 2018-11-19 ENCOUNTER — Encounter: Payer: Self-pay | Admitting: *Deleted

## 2018-12-09 LAB — BCR/ABL

## 2019-01-08 ENCOUNTER — Other Ambulatory Visit: Payer: Self-pay | Admitting: *Deleted

## 2019-01-08 MED ORDER — IMATINIB MESYLATE 400 MG PO TABS: ORAL_TABLET | ORAL | 5 refills | Status: DC

## 2019-01-21 DIAGNOSIS — Z1331 Encounter for screening for depression: Secondary | ICD-10-CM | POA: Diagnosis not present

## 2019-01-21 DIAGNOSIS — R011 Cardiac murmur, unspecified: Secondary | ICD-10-CM | POA: Diagnosis not present

## 2019-01-21 DIAGNOSIS — D649 Anemia, unspecified: Secondary | ICD-10-CM | POA: Diagnosis not present

## 2019-01-21 DIAGNOSIS — I10 Essential (primary) hypertension: Secondary | ICD-10-CM | POA: Diagnosis not present

## 2019-01-21 DIAGNOSIS — C9590 Leukemia, unspecified not having achieved remission: Secondary | ICD-10-CM | POA: Diagnosis not present

## 2019-01-26 ENCOUNTER — Other Ambulatory Visit (HOSPITAL_COMMUNITY): Payer: Self-pay | Admitting: Internal Medicine

## 2019-01-26 DIAGNOSIS — R011 Cardiac murmur, unspecified: Secondary | ICD-10-CM

## 2019-01-27 ENCOUNTER — Other Ambulatory Visit: Payer: Self-pay

## 2019-01-27 ENCOUNTER — Ambulatory Visit (HOSPITAL_COMMUNITY): Payer: BC Managed Care – PPO | Attending: Cardiology

## 2019-01-27 DIAGNOSIS — R011 Cardiac murmur, unspecified: Secondary | ICD-10-CM | POA: Diagnosis not present

## 2019-01-29 DIAGNOSIS — Z23 Encounter for immunization: Secondary | ICD-10-CM | POA: Diagnosis not present

## 2019-02-12 ENCOUNTER — Inpatient Hospital Stay: Payer: BC Managed Care – PPO | Attending: Oncology

## 2019-02-12 ENCOUNTER — Other Ambulatory Visit: Payer: Self-pay

## 2019-02-12 DIAGNOSIS — C9111 Chronic lymphocytic leukemia of B-cell type in remission: Secondary | ICD-10-CM | POA: Insufficient documentation

## 2019-02-12 DIAGNOSIS — C9211 Chronic myeloid leukemia, BCR/ABL-positive, in remission: Secondary | ICD-10-CM

## 2019-02-12 LAB — CBC WITH DIFFERENTIAL (CANCER CENTER ONLY)
Abs Immature Granulocytes: 0.01 10*3/uL (ref 0.00–0.07)
Basophils Absolute: 0.1 10*3/uL (ref 0.0–0.1)
Basophils Relative: 1 %
Eosinophils Absolute: 1.3 10*3/uL — ABNORMAL HIGH (ref 0.0–0.5)
Eosinophils Relative: 20 %
HCT: 33.6 % — ABNORMAL LOW (ref 39.0–52.0)
Hemoglobin: 11.2 g/dL — ABNORMAL LOW (ref 13.0–17.0)
Immature Granulocytes: 0 %
Lymphocytes Relative: 19 %
Lymphs Abs: 1.3 10*3/uL (ref 0.7–4.0)
MCH: 30.9 pg (ref 26.0–34.0)
MCHC: 33.3 g/dL (ref 30.0–36.0)
MCV: 92.8 fL (ref 80.0–100.0)
Monocytes Absolute: 0.6 10*3/uL (ref 0.1–1.0)
Monocytes Relative: 8 %
Neutro Abs: 3.5 10*3/uL (ref 1.7–7.7)
Neutrophils Relative %: 52 %
Platelet Count: 187 10*3/uL (ref 150–400)
RBC: 3.62 MIL/uL — ABNORMAL LOW (ref 4.22–5.81)
RDW: 12.4 % (ref 11.5–15.5)
WBC Count: 6.8 10*3/uL (ref 4.0–10.5)
nRBC: 0 % (ref 0.0–0.2)

## 2019-02-12 LAB — CMP (CANCER CENTER ONLY)
ALT: 20 U/L (ref 0–44)
AST: 31 U/L (ref 15–41)
Albumin: 4 g/dL (ref 3.5–5.0)
Alkaline Phosphatase: 83 U/L (ref 38–126)
Anion gap: 8 (ref 5–15)
BUN: 14 mg/dL (ref 6–20)
CO2: 31 mmol/L (ref 22–32)
Calcium: 9.1 mg/dL (ref 8.9–10.3)
Chloride: 100 mmol/L (ref 98–111)
Creatinine: 1.44 mg/dL — ABNORMAL HIGH (ref 0.61–1.24)
GFR, Est AFR Am: >60 mL/min (ref >60)
GFR, Estimated: 53 mL/min — ABNORMAL LOW (ref >60)
Glucose, Bld: 119 mg/dL — ABNORMAL HIGH (ref 70–99)
Potassium: 3.9 mmol/L (ref 3.5–5.1)
Sodium: 139 mmol/L (ref 135–145)
Total Bilirubin: 0.5 mg/dL (ref 0.3–1.2)
Total Protein: 6.7 g/dL (ref 6.5–8.1)

## 2019-02-12 LAB — VITAMIN B12: Vitamin B-12: 804 pg/mL (ref 180–914)

## 2019-02-27 DIAGNOSIS — Z23 Encounter for immunization: Secondary | ICD-10-CM | POA: Diagnosis not present

## 2019-05-12 ENCOUNTER — Inpatient Hospital Stay: Payer: BC Managed Care – PPO | Attending: Oncology | Admitting: Oncology

## 2019-05-12 ENCOUNTER — Other Ambulatory Visit: Payer: Self-pay

## 2019-05-12 ENCOUNTER — Inpatient Hospital Stay: Payer: BC Managed Care – PPO

## 2019-05-12 VITALS — BP 158/66 | HR 83 | Temp 97.8°F | Resp 18 | Ht 68.0 in | Wt 154.1 lb

## 2019-05-12 DIAGNOSIS — C9211 Chronic myeloid leukemia, BCR/ABL-positive, in remission: Secondary | ICD-10-CM | POA: Diagnosis not present

## 2019-05-12 DIAGNOSIS — I10 Essential (primary) hypertension: Secondary | ICD-10-CM | POA: Insufficient documentation

## 2019-05-12 DIAGNOSIS — R011 Cardiac murmur, unspecified: Secondary | ICD-10-CM | POA: Insufficient documentation

## 2019-05-12 DIAGNOSIS — N189 Chronic kidney disease, unspecified: Secondary | ICD-10-CM | POA: Insufficient documentation

## 2019-05-12 DIAGNOSIS — D721 Eosinophilia, unspecified: Secondary | ICD-10-CM | POA: Diagnosis not present

## 2019-05-12 DIAGNOSIS — C9210 Chronic myeloid leukemia, BCR/ABL-positive, not having achieved remission: Secondary | ICD-10-CM | POA: Diagnosis not present

## 2019-05-12 LAB — CMP (CANCER CENTER ONLY)
ALT: 27 U/L (ref 0–44)
AST: 31 U/L (ref 15–41)
Albumin: 4.2 g/dL (ref 3.5–5.0)
Alkaline Phosphatase: 87 U/L (ref 38–126)
Anion gap: 8 (ref 5–15)
BUN: 16 mg/dL (ref 6–20)
CO2: 29 mmol/L (ref 22–32)
Calcium: 8.7 mg/dL — ABNORMAL LOW (ref 8.9–10.3)
Chloride: 100 mmol/L (ref 98–111)
Creatinine: 1.55 mg/dL — ABNORMAL HIGH (ref 0.61–1.24)
GFR, Est AFR Am: 56 mL/min — ABNORMAL LOW (ref >60)
GFR, Estimated: 48 mL/min — ABNORMAL LOW (ref >60)
Glucose, Bld: 100 mg/dL — ABNORMAL HIGH (ref 70–99)
Potassium: 4.1 mmol/L (ref 3.5–5.1)
Sodium: 137 mmol/L (ref 135–145)
Total Bilirubin: 0.4 mg/dL (ref 0.3–1.2)
Total Protein: 7 g/dL (ref 6.5–8.1)

## 2019-05-12 LAB — CBC WITH DIFFERENTIAL (CANCER CENTER ONLY)
Abs Immature Granulocytes: 0.01 10*3/uL (ref 0.00–0.07)
Basophils Absolute: 0.1 10*3/uL (ref 0.0–0.1)
Basophils Relative: 1 %
Eosinophils Absolute: 1 10*3/uL — ABNORMAL HIGH (ref 0.0–0.5)
Eosinophils Relative: 15 %
HCT: 33.7 % — ABNORMAL LOW (ref 39.0–52.0)
Hemoglobin: 11.2 g/dL — ABNORMAL LOW (ref 13.0–17.0)
Immature Granulocytes: 0 %
Lymphocytes Relative: 23 %
Lymphs Abs: 1.5 10*3/uL (ref 0.7–4.0)
MCH: 30.7 pg (ref 26.0–34.0)
MCHC: 33.2 g/dL (ref 30.0–36.0)
MCV: 92.3 fL (ref 80.0–100.0)
Monocytes Absolute: 0.6 10*3/uL (ref 0.1–1.0)
Monocytes Relative: 9 %
Neutro Abs: 3.3 10*3/uL (ref 1.7–7.7)
Neutrophils Relative %: 52 %
Platelet Count: 196 10*3/uL (ref 150–400)
RBC: 3.65 MIL/uL — ABNORMAL LOW (ref 4.22–5.81)
RDW: 12.2 % (ref 11.5–15.5)
WBC Count: 6.5 10*3/uL (ref 4.0–10.5)
nRBC: 0 % (ref 0.0–0.2)

## 2019-05-12 NOTE — Progress Notes (Signed)
  Patrick Nash   Diagnosis: CML  INTERVAL HISTORY:   Patrick Nash returns as scheduled.  He continues [place].  He feels well.  No rash, swelling, or diarrhea.  Objective:  Vital signs in last 24 hours:  Blood pressure (!) 158/66, pulse 83, temperature 97.8 F (36.6 C), temperature source Temporal, resp. rate 18, height 5\' 8"  (1.727 m), weight 154 lb 1.6 oz (69.9 kg), SpO2 100 %.   Limited physical examination secondary to distancing with the Covid pandemic HEENT: No periorbital edema  GI: No hepatosplenomegaly Vascular: No leg edema  Skin: No rash   Lab Results:  Lab Results  Component Value Date   WBC 6.5 05/12/2019   HGB 11.2 (L) 05/12/2019   HCT 33.7 (L) 05/12/2019   MCV 92.3 05/12/2019   PLT 196 05/12/2019   NEUTROABS 3.3 05/12/2019    CMP  Lab Results  Component Value Date   NA 137 05/12/2019   K 4.1 05/12/2019   CL 100 05/12/2019   CO2 29 05/12/2019   GLUCOSE 100 (H) 05/12/2019   BUN 16 05/12/2019   CREATININE 1.55 (H) 05/12/2019   CALCIUM 8.7 (L) 05/12/2019   PROT 7.0 05/12/2019   ALBUMIN 4.2 05/12/2019   AST 31 05/12/2019   ALT 27 05/12/2019   ALKPHOS 87 05/12/2019   BILITOT 0.4 05/12/2019   GFRNONAA 48 (L) 05/12/2019   GFRAA 56 (L) 05/12/2019     Medications: I have reviewed the patient's current medications.   Assessment/Plan: 1. CML diagnosed in 2004  Initial molecular remission with Gleevec  Gleevec discontinued April 2017, resumed in June 2017 after a rise in the peripheral blood BCR-ABL  Gleevec resumed and he again entered molecular remission 2. Hypertension 3. Chronic renal insufficiency 4. Chronic eosinophilia 5. Systolic heart murmur noted on exam 11/13/2018    Disposition: Patrick Nash remains in hematologic remission from [facility].  The peripheral blood PCR was negative on November 13, 2018.  We will follow-up on the PCR from today.  He is now followed by Dr. Joylene Draft for internal medicine care.  He  has stable renal insufficiency.  Patrick Nash will return for a lab visit in 3 months and an office visit in 6 months. I encouraged him to obtain a COVID-19 vaccine when available. Betsy Coder, MD  05/12/2019  4:03 PM

## 2019-05-13 ENCOUNTER — Telehealth: Payer: Self-pay | Admitting: Oncology

## 2019-05-13 NOTE — Telephone Encounter (Signed)
Scheduled per los. Called and left msg. mailed printout  

## 2019-05-20 ENCOUNTER — Telehealth: Payer: Self-pay | Admitting: *Deleted

## 2019-05-20 NOTE — Telephone Encounter (Signed)
Called with negative (not detected) bcr/abl results.

## 2019-05-22 LAB — BCR/ABL

## 2019-06-02 ENCOUNTER — Telehealth: Admit: 2019-06-02 | Payer: PRIVATE HEALTH INSURANCE | Attending: Urology | Primary: Internal Medicine

## 2019-06-02 DIAGNOSIS — N528 Other male erectile dysfunction: Secondary | ICD-10-CM

## 2019-06-02 NOTE — Telephone Encounter
[employer] manager, please complete and fax back

## 2019-06-03 MED ORDER — TADALAFIL 20 MG TABLET
20 mg | ORAL_TABLET | Freq: Every day | ORAL | 4 refills | Status: AC | PRN
Start: 2019-06-03 — End: 2020-06-07

## 2019-06-04 ENCOUNTER — Encounter
Admit: 2019-06-04 | Payer: PRIVATE HEALTH INSURANCE | Attending: Vascular and Interventional Radiology | Primary: Internal Medicine

## 2019-06-04 NOTE — Telephone Encounter
Cathy from [place] [facility] called to follow up on the request form sent for the Cialis refill.  I advised we are in receipt of the request and I will route to the [facility] nurse pool to have the provider complete and fax back if appropriate.

## 2019-06-04 NOTE — Telephone Encounter
[place] [employer] order form signed by Provider and faxed back to [phone]. Confirmation received.

## 2019-06-04 NOTE — Telephone Encounter
Patient called to check on his refill and was advised the form was completed and faxed today with confirmation. Patrick Casey verbalized understanding.

## 2019-06-09 ENCOUNTER — Ambulatory Visit: Admit: 2019-06-09 | Payer: BLUE CROSS/BLUE SHIELD | Attending: Urology | Primary: Internal Medicine

## 2019-06-09 ENCOUNTER — Encounter: Admit: 2019-06-09 | Payer: PRIVATE HEALTH INSURANCE | Attending: Urology | Primary: Internal Medicine

## 2019-06-09 DIAGNOSIS — Z125 Encounter for screening for malignant neoplasm of prostate: Secondary | ICD-10-CM

## 2019-06-09 DIAGNOSIS — Z8042 Family history of malignant neoplasm of prostate: Secondary | ICD-10-CM

## 2019-06-09 DIAGNOSIS — N399 Disorder of urinary system, unspecified: Secondary | ICD-10-CM

## 2019-06-09 DIAGNOSIS — C8590 Non-Hodgkin lymphoma, unspecified, unspecified site: Secondary | ICD-10-CM

## 2019-06-09 MED ORDER — TADALAFIL 20 MG TABLET
20 mg | ORAL_TABLET | Freq: Every day | ORAL | 12 refills | Status: AC | PRN
Start: 2019-06-09 — End: ?

## 2019-06-09 NOTE — Progress Notes
HPIRichard S Casey is a 59 y.o. male who presents with a chief complaint of ED and a family hx of prostate cancer. The patient has erectile dysfunction.  This has been a problem for several years.  He has taken Cialis 20 milligrams daily as needed with success. He has nocturia x 2-3. Urinary flow is moderate. His father had prostate cancer, died from metastatic disease.  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 NHL (non-Hodgkin's lymphoma) (HC Code)  Past Surgical HistoryPast Surgical History: Procedure Laterality Date ? fractured skull    age 71 ? HERNIA REPAIR   ? left knee cartiledge repair   ? right hip repair   ? right knee cyst removal   ? right shoulder repair   AllergiesNo Known AllergiesMedicationsOutpatient Encounter Medications as of 06/09/2019 Medication Sig Dispense Refill ? calcium-vitamin D (CALCIUM-VITAMIN D) 500 mg(1,250mg ) -200 unit per tablet Take 1 tablet by mouth 2 (two) times daily (0800, 1800)..   ? CHONDROITIN SULFATE A ORAL Dose: Not available; Form: Not available; Route: PO; Frequency: Not available; Directions: Not available; Details: Dispense: 0; Date: 04/12/2009   ? tadalafiL (CIALIS) 20 mg tablet Take 1 tablet (20 mg total) by mouth daily as needed for erectile dysfunction. 120 tablet 3 ? MVI- adult no.2 without vitamin K (MVI) 3,300 unit-200 unit/10 mL injection Inject 10 mLs into the vein.   No facility-administered encounter medications on file as of 06/09/2019.   Social HistorySocial History Tobacco Use ? Smoking status: Never Smoker ? Smokeless tobacco: Never Used Substance Use Topics ? Alcohol use: Yes   Alcohol/week: 5.0 standard drinks   Types: 5 Cans of beer per week ? Drug use: No  Family History Family History Problem Relation Age of Onset ? Dementia Mother  ? Alcohol abuse Mother  ? Breast cancer Mother  ? Alcohol abuse Father  ? Prostate cancer Father  ? Parkinsonism Father  Review of SystemsConstitutional: negative for weight changeCardiovascular: negative for chest painRespiratory: negative for cough or SOBAll other systems reviewed and are negative Physical ExamBP (!) 144/80  - Pulse (!) 57  - Temp 97.6 ?F (36.4 ?C) (Temporal)  - Ht 5' 9 (1.753 m)  - Wt 79.4 kg  - BMI 25.84 kg/m?  gen - NADDRE - normal prostate, no nodules Lab Results Component Value Date  UCOLOR yellow 06/09/2019  UGLUCOSE Negative 06/09/2019  UKETONE Negative 06/09/2019  USPECGRAVITY 1.015 06/09/2019  UPROTEIN Negative 06/09/2019  UNITRATES Negative 06/09/2019  UBLOOD Negative 06/09/2019  ULEUKOCYTES Negative 06/09/2019   Assessment:Patrick Casey is a 59 y.o. male with ED and family hx of CaPPlan:DiscussionChange cialis to 5 mg daily to treat ED and urinary symptoms related to bph ( he will split Cialis 20 mg daily)Obtain PSA for prostate cancer screeningFollow up in 1 yearOrders Placed This Encounter Procedures ? PSA, total (screening) ([facility]) ? POC urinalysis manual w/o scope No follow-ups on file.Patient will let us know if any new problems or symptoms arise in the interim.Pamella Pert, MDYale [phone]

## 2019-06-17 ENCOUNTER — Inpatient Hospital Stay: Admit: 2019-06-17 | Discharge: 2019-06-17 | Payer: BLUE CROSS/BLUE SHIELD | Primary: Internal Medicine

## 2019-06-17 ENCOUNTER — Ambulatory Visit: Admit: 2019-06-17 | Payer: PRIVATE HEALTH INSURANCE | Attending: Urology | Primary: Internal Medicine

## 2019-06-17 ENCOUNTER — Encounter: Admit: 2019-06-17 | Payer: PRIVATE HEALTH INSURANCE | Attending: Urology | Primary: Internal Medicine

## 2019-06-17 DIAGNOSIS — Z8042 Family history of malignant neoplasm of prostate: Secondary | ICD-10-CM

## 2019-06-17 DIAGNOSIS — Z125 Encounter for screening for malignant neoplasm of prostate: Secondary | ICD-10-CM

## 2019-06-17 DIAGNOSIS — R202 Paresthesia of skin: Secondary | ICD-10-CM

## 2019-06-17 DIAGNOSIS — Z1211 Encounter for screening for malignant neoplasm of colon: Secondary | ICD-10-CM | POA: Diagnosis not present

## 2019-06-17 DIAGNOSIS — Z1212 Encounter for screening for malignant neoplasm of rectum: Secondary | ICD-10-CM | POA: Diagnosis not present

## 2019-06-18 ENCOUNTER — Encounter: Admit: 2019-06-18 | Payer: PRIVATE HEALTH INSURANCE | Attending: Urology | Primary: Internal Medicine

## 2019-06-18 LAB — PSA, TOTAL (SCREENING) (BH GH L LMW YH): BKR PROSTATE SPECIFIC ANTIGEN, SCREENING: 0.719 ng/mL (ref 0.000–3.900)

## 2019-06-18 LAB — VITAMIN B12: BKR VITAMIN B12: 814 pg/mL (ref 232–1245)

## 2019-06-21 LAB — COLOGUARD: COLOGUARD: NEGATIVE

## 2019-06-23 LAB — VITAMIN D, 25-HYDROXY
BKR VITAMIN D, 25-HYDROXY TOTAL (YH): 38 ng/mL (ref 30–100)
BKR VITAMIN D2, 25-HYDROXY: <5 ng/mL
BKR VITAMIN D3, 25-HYDROXY: 38 ng/mL

## 2019-06-26 ENCOUNTER — Other Ambulatory Visit: Payer: Self-pay | Admitting: Oncology

## 2019-07-11 ENCOUNTER — Ambulatory Visit: Admit: 2019-07-11 | Payer: PRIVATE HEALTH INSURANCE | Attending: Internal Medicine | Primary: Internal Medicine

## 2019-07-11 DIAGNOSIS — M7989 Other specified soft tissue disorders: Secondary | ICD-10-CM

## 2019-07-11 DIAGNOSIS — C8228 Follicular lymphoma grade III, unspecified, lymph nodes of multiple sites: Secondary | ICD-10-CM

## 2019-07-13 ENCOUNTER — Inpatient Hospital Stay: Admit: 2019-07-13 | Discharge: 2019-07-13 | Payer: BLUE CROSS/BLUE SHIELD | Primary: Internal Medicine

## 2019-07-13 DIAGNOSIS — R062 Wheezing: Secondary | ICD-10-CM

## 2019-07-14 ENCOUNTER — Inpatient Hospital Stay: Admit: 2019-07-14 | Discharge: 2019-07-14 | Payer: BLUE CROSS/BLUE SHIELD | Primary: Internal Medicine

## 2019-07-14 DIAGNOSIS — M7989 Other specified soft tissue disorders: Secondary | ICD-10-CM

## 2019-07-14 DIAGNOSIS — C8228 Follicular lymphoma grade III, unspecified, lymph nodes of multiple sites: Secondary | ICD-10-CM

## 2019-07-14 NOTE — Other
Discussed results with patient, and will f/u with Surgery on Monday for potential biopsy next week, given hx of Follicular Lymphoma

## 2019-07-15 ENCOUNTER — Encounter: Admit: 2019-07-15 | Payer: PRIVATE HEALTH INSURANCE | Attending: Adult Health | Primary: Internal Medicine

## 2019-07-15 ENCOUNTER — Encounter: Admit: 2019-07-15 | Payer: PRIVATE HEALTH INSURANCE | Attending: Trauma Surgery | Primary: Internal Medicine

## 2019-07-15 DIAGNOSIS — C8590 Non-Hodgkin lymphoma, unspecified, unspecified site: Secondary | ICD-10-CM

## 2019-07-15 DIAGNOSIS — M1990 Unspecified osteoarthritis, unspecified site: Secondary | ICD-10-CM

## 2019-07-15 DIAGNOSIS — Z01818 Encounter for other preprocedural examination: Secondary | ICD-10-CM

## 2019-07-17 ENCOUNTER — Encounter: Admit: 2019-07-17 | Payer: PRIVATE HEALTH INSURANCE | Attending: Trauma Surgery | Primary: Internal Medicine

## 2019-07-17 ENCOUNTER — Ambulatory Visit: Admit: 2019-07-17 | Payer: BLUE CROSS/BLUE SHIELD | Attending: Trauma Surgery | Primary: Internal Medicine

## 2019-07-17 DIAGNOSIS — M1990 Unspecified osteoarthritis, unspecified site: Secondary | ICD-10-CM

## 2019-07-17 DIAGNOSIS — R591 Generalized enlarged lymph nodes: Secondary | ICD-10-CM

## 2019-07-17 DIAGNOSIS — C8590 Non-Hodgkin lymphoma, unspecified, unspecified site: Secondary | ICD-10-CM

## 2019-07-17 NOTE — Progress Notes
Subjective:   Patrick Casey is a 59 y.o. male  who had concerns including Mass (left supraclavicular lymph node).HPIPatient's medications, allergies, problem list, past medical, surgical, social and family histories were reviewed and updated as appropriate. This is a 59 year old male with a history of non-Hodgkin's lymphoma noticed an enlarged left chest wall lymph node over his left clavicle and now presents for evaluation and possible excision.He first noticed this mass this month.  He denies any fever or chills, night sweats, weight loss or appetite change.  He denies any weakness, shortness of breath or recent change in his health.  He denies any recent illnesses or infections.  He did recently receive his COVID immunization.He is concerned about this mass since his 1st diagnosis of lymphoma occurred in a very similar manner after discovery of an asymptomatic right-sided lymph node.Past Medical History: Diagnosis Date ? Arthritis  ? NHL (non-Hodgkin's lymphoma) (HC Code)  Past Surgical History: Procedure Laterality Date ? fractured skull    age 73 ? HERNIA REPAIR   ? KNEE ARTHROSCOPY   ? left knee cartiledge repair   ? right hip repair   ? right knee cyst removal   ? right shoulder repair   ? SHOULDER SURGERY Left  Family History Problem Relation Age of Onset ? Dementia Mother  ? Alcohol abuse Mother  ? Breast cancer Mother  ? Alcohol abuse Father  ? Prostate cancer Father  ? Parkinsonism Father  Social History Tobacco Use ? Smoking status: Never Smoker ? Smokeless tobacco: Never Used Substance Use Topics ? Alcohol use: Yes   Alcohol/week: 4.0 standard drinks   Types: 4 Cans of beer per week ? Drug use: No No Known AllergiesCurrent Outpatient Medications Medication Sig ? calcium-vitamin D (CALCIUM-VITAMIN D) 500 mg(1,250mg ) -200 unit per tablet Take 1 tablet by mouth 2 (two) times daily (0800, 1800).. ? CHONDROITIN SULFATE A ORAL Dose: Not available; Form: Not available; Route: PO; Frequency: Not available; Directions: Not available; Details: Dispense: 0; Date: 04/12/2009 ? MVI- adult no.2 without vitamin K (MVI) 3,300 unit-200 unit/10 mL injection Inject 10 mLs into the vein. ? tadalafiL (CIALIS) 20 mg tablet Take 1 tablet (20 mg total) by mouth daily as needed for erectile dysfunction. (Patient not taking: Reported on 07/17/2019) No current facility-administered medications for this visit.  Review of Systems Constitutional: Negative for activity change, appetite change, chills, fatigue, fever and unexpected weight change. HENT: Negative.  Eyes: Negative.  Respiratory: Negative.  Cardiovascular: Negative.  Gastrointestinal: Negative.  Negative for abdominal pain, constipation, diarrhea, nausea and vomiting. Endocrine: Negative.  Genitourinary: Negative.  Musculoskeletal: Positive for arthralgias. Skin:      Left supraclavicular mass Allergic/Immunologic: Negative.  Neurological: Negative.  Hematological: Negative.    Objective:  Ht 5' 9 (1.753 m)  - Wt 85.2 kg  - BMI 27.73 kg/m? There were no orthostatic vitals filed for this visit.Physical Exam Constitutional: He is oriented to person, place, and time. He appears well-developed and well-nourished. No distress. HENT: Head: Normocephalic and atraumatic. Nose: Nose normal. Mouth/Throat: Oropharynx is clear and moist. No oropharyngeal exudate. Eyes: Pupils are equal, round, and reactive to light. Conjunctivae and EOM are normal. No scleral icterus. Neck: Normal range of motion. Neck supple. No JVD present. No tracheal deviation present. No thyromegaly present. Cardiovascular: Normal rate, regular rhythm, normal heart sounds and intact distal pulses. Exam reveals no gallop and no friction rub. No murmur heard.Pulmonary/Chest: No stridor. No respiratory distress. He has no wheezes. He has no rales. He exhibits no tenderness. Abdominal:  Soft. Bowel sounds are normal. He exhibits no distension and no mass. There is no abdominal tenderness. There is no rebound and no guarding. Musculoskeletal: Normal range of motion. Lymphadenopathy:   He has cervical adenopathy. Neurological: He is alert and oriented to person, place, and time. He has normal reflexes. No cranial nerve deficit. He exhibits normal muscle tone. Coordination normal. Skin: Skin is warm and dry. No rash noted. He is not diaphoretic. No erythema. No pallor. Psychiatric: He has a normal mood and affect. His behavior is normal. Judgment and thought content normal. ULTRASOUND REPORT:Superficial ultrasound of the location?HISTORY:  59 y/o man with hx of follicular lymphoma, now with soft tissue mass again on left side.   ?COMPARISON:  None?TECHNIQUE: Multiple longitudinal and transverse images were obtained with a high-resolution linear array transducer overlying the region of suspected abnormality.?FINDINGS:  In the region of the palpable abnormality, a simple appearing lymph node can be seen. This measures at 2.5 x 0.5 x 0.4 cm. There does appear to be a fatty hilum. This is not thought to be pathologic. No fluid collections cysts or abscess formation can be seen.?   IMPRESSION:  Lymph node seen in the region of the palpable abnormality. This is not thought to be suspicious. Correlation with follow-up clinical findings can be obtained. If clinical symptoms persist, or worsen, correlation with follow-up exam can be obtained.?Reported And Signed By: Donivan Scull  Assessment / Plan:  This is a healthy 59 year old male who presents with an asymptomatic left supraclavicular surgeon enlarged lymph node.  Given his prior history of lymphoma he is nervous and wishes to have this removed for definitive diagnosis.  Given his history I feel this is appropriate and would be beneficial for peace of mind even if it is negative.  He has excellent understanding of the procedure and possible risks and benefits.  He is tentatively scheduled for this Wednesday for surgery.  We will plan to perform this over under MAC/local anesthesia.  He understands and wished to proceed.  A consent was signed in the office.  All of his questions were answered. Electronically Signed by Louis Meckel, MD, July 17, 2019

## 2019-07-19 ENCOUNTER — Inpatient Hospital Stay: Admit: 2019-07-19 | Discharge: 2019-07-19 | Payer: BLUE CROSS/BLUE SHIELD | Primary: Internal Medicine

## 2019-07-19 DIAGNOSIS — Z20822 Contact with and (suspected) exposure to covid-19: Secondary | ICD-10-CM

## 2019-07-19 DIAGNOSIS — Z01812 Encounter for preprocedural laboratory examination: Secondary | ICD-10-CM

## 2019-07-19 DIAGNOSIS — Z01818 Encounter for other preprocedural examination: Secondary | ICD-10-CM

## 2019-07-20 ENCOUNTER — Encounter: Admit: 2019-07-20 | Payer: PRIVATE HEALTH INSURANCE | Primary: Internal Medicine

## 2019-07-20 ENCOUNTER — Telehealth: Admit: 2019-07-20 | Payer: PRIVATE HEALTH INSURANCE | Primary: Internal Medicine

## 2019-07-20 DIAGNOSIS — M1990 Unspecified osteoarthritis, unspecified site: Secondary | ICD-10-CM

## 2019-07-20 DIAGNOSIS — C8590 Non-Hodgkin lymphoma, unspecified, unspecified site: Secondary | ICD-10-CM

## 2019-07-20 LAB — COVID-19 CLEARANCE OR FOR PLACEMENT ONLY: BKR SARS-COV-2 RNA (COVID-19) (YH): NOT DETECTED

## 2019-07-20 MED ORDER — MULTIPLE VITAMIN ORAL
Freq: Every day | ORAL | Status: AC
Start: 2019-07-20 — End: ?

## 2019-07-20 MED ORDER — GLUCOSAMINE-CHONDROITIN 500 MG-400 MG TABLET
500-400 mg | Freq: Three times a day (TID) | ORAL | Status: AC
Start: 2019-07-20 — End: ?

## 2019-07-20 NOTE — Telephone Encounter
DX:  Non Hodgkin Follicular Lymphoma; New Left Supraclavicular Lymph NodeReferring Provider:  Annitta Needs, MDTelephone call made to patient prior to oncology consult appointment that is being scheduled.  I introduced myself and explained my role, the reason for my call and what to expect at the first visit.  Reviewed directions and parking instructions.  Allergy information and medications reviewed and updated.  Also, informed patient that we will have the opportunity to meet at the visit.  Office contact information provided and encouraged to call with any questions or concerns.  Initial nursing assessment initiated.Patient has no hx of falls and uses no assistive devices when ambulating so is not a fall risk at this time.Briefly, Patrick Casey is a 59 year old married man who lives with his wife in [place], [place].  He has four children, a biological daughter and son and a step daughter and step son.  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  He has no hx of [employer].  He has never smoked cigarettes, denies any illicit drug use and states drinks 3 beers a week.COVID-19 screening is negative as he has not travelled, has had no contact with anyone positive for COVID-19 and has no s/s illness.Patient is having a lymph node biopsy on 07/22/2019.  He is only interested in coming to [place] if it is positive.  He does not wish to transfer care here.

## 2019-07-22 ENCOUNTER — Ambulatory Visit: Admit: 2019-07-22 | Payer: PRIVATE HEALTH INSURANCE | Attending: Anesthesiology | Primary: Internal Medicine

## 2019-07-22 ENCOUNTER — Encounter: Admit: 2019-07-22 | Payer: PRIVATE HEALTH INSURANCE | Attending: Trauma Surgery | Primary: Internal Medicine

## 2019-07-22 ENCOUNTER — Inpatient Hospital Stay: Admit: 2019-07-22 | Discharge: 2019-07-22 | Payer: BLUE CROSS/BLUE SHIELD | Primary: Internal Medicine

## 2019-07-22 DIAGNOSIS — C8590 Non-Hodgkin lymphoma, unspecified, unspecified site: Secondary | ICD-10-CM

## 2019-07-22 DIAGNOSIS — D759 Disease of blood and blood-forming organs, unspecified: Secondary | ICD-10-CM

## 2019-07-22 DIAGNOSIS — M1990 Unspecified osteoarthritis, unspecified site: Secondary | ICD-10-CM

## 2019-07-22 DIAGNOSIS — C8228 Follicular lymphoma grade III, unspecified, lymph nodes of multiple sites: Secondary | ICD-10-CM

## 2019-07-22 DIAGNOSIS — Z79899 Other long term (current) drug therapy: Secondary | ICD-10-CM

## 2019-07-22 MED ORDER — SODIUM CHLORIDE 0.9 % IRRIGATION SOLUTION
0.9 % irrigation | Status: CP | PRN
Start: 2019-07-22 — End: ?
  Administered 2019-07-22: 16:00:00 0.9 % irrigation

## 2019-07-22 MED ORDER — MIDAZOLAM (PF) 1 MG/ML INJECTION SOLUTION
1 mg/mL | Status: CP
Start: 2019-07-22 — End: ?

## 2019-07-22 MED ORDER — NALOXONE 0.4 MG/ML INJECTION SOLUTION
0.4 mg/mL | INTRAVENOUS | Status: DC | PRN
Start: 2019-07-22 — End: 2019-07-22

## 2019-07-22 MED ORDER — FENTANYL (PF) 50 MCG/ML INJECTION SOLUTION
50 mcg/mL | Status: CP
Start: 2019-07-22 — End: ?

## 2019-07-22 MED ORDER — LACTATED RINGERS INTRAVENOUS SOLUTION
Status: DC | PRN
Start: 2019-07-22 — End: 2019-07-22
  Administered 2019-07-22: 16:00:00 via INTRAVENOUS

## 2019-07-22 MED ORDER — PROPOFOL 10 MG/ML INTRAVENOUS EMULSION
10 mg/mL | Status: CP
Start: 2019-07-22 — End: ?

## 2019-07-22 MED ORDER — LIDOCAINE 1 %-EPINEPHRINE 1:100,000 INJECTION SOLUTION
1 %-:00,000 | Status: DC | PRN
Start: 2019-07-22 — End: 2019-07-22
  Administered 2019-07-22 (×2): 1 %-:00,000

## 2019-07-22 MED ORDER — SODIUM CHLORIDE 0.9 % (FLUSH) INJECTION SYRINGE
0.9 % | Freq: Three times a day (TID) | INTRAVENOUS | Status: DC
Start: 2019-07-22 — End: 2019-07-22

## 2019-07-22 MED ORDER — ONDANSETRON HCL (PF) 4 MG/2 ML INJECTION SOLUTION
42 mg/2 mL | INTRAVENOUS | Status: DC | PRN
Start: 2019-07-22 — End: 2019-07-22

## 2019-07-22 MED ORDER — FENTANYL (PF) 50 MCG/ML INJECTION SOLUTION
50 mcg/mL | INTRAVENOUS | Status: DC | PRN
Start: 2019-07-22 — End: 2019-07-22

## 2019-07-22 MED ORDER — SODIUM CHLORIDE 0.9 % (FLUSH) INJECTION SYRINGE
0.9 % | INTRAVENOUS | Status: DC | PRN
Start: 2019-07-22 — End: 2019-07-22

## 2019-07-22 MED ORDER — MEPERIDINE 25 MG/2.5 ML IN 0.9% SODIUM CHLORIDE
INTRAVENOUS | Status: DC | PRN
Start: 2019-07-22 — End: 2019-07-22

## 2019-07-22 MED ORDER — IBUPROFEN 600 MG TABLET
600 mg | ORAL_TABLET | Freq: Four times a day (QID) | ORAL | 1 refills | Status: AC | PRN
Start: 2019-07-22 — End: ?

## 2019-07-22 MED ORDER — MIDAZOLAM (PF) 1 MG/ML INJECTION SOLUTION
1 mg/mL | Status: DC | PRN
Start: 2019-07-22 — End: 2019-07-22
  Administered 2019-07-22 (×3): 1 mg/mL via INTRAVENOUS

## 2019-07-22 MED ORDER — PROPOFOL 10 MG/ML INTRAVENOUS EMULSION
10 mg/mL | Status: DC | PRN
Start: 2019-07-22 — End: 2019-07-22
  Administered 2019-07-22 (×2): 10 mg/mL via INTRAVENOUS

## 2019-07-22 MED ORDER — HYDROMORPHONE 2 MG/ML INJECTION SOLUTION
2 mg/mL | INTRAVENOUS | Status: DC | PRN
Start: 2019-07-22 — End: 2019-07-22

## 2019-07-22 MED ORDER — FENTANYL (PF) 50 MCG/ML INJECTION SOLUTION
50 mcg/mL | Status: DC | PRN
Start: 2019-07-22 — End: 2019-07-22
  Administered 2019-07-22 (×3): 50 mcg/mL via INTRAVENOUS

## 2019-07-22 NOTE — Anesthesia Pre-Procedure Evaluation
This is a 59 y.o. male scheduled for Excision/Biopsy of Lymph Node - Left Clavicle (Left ).Review of Systems/ Medical HistoryPatient summary, nursing notes, EKG/Cardiac Studies , Labs, pre-procedure vitals, height, weight and NPO status reviewed.No previous anesthesia concernsAnesthesia Evaluation:   No history of anesthetic complications  Estimated body mass index is 25.84 kg/m? as calculated from the following:  Height as of this encounter: 5' 9 (1.753 m).  Weight as of this encounter: 79.4 kg. Cardiovascular: Negative   -Exercise tolerance: >4 METS -Vascular Disease:  Negative   Respiratory:  Negative.HEENT: Negative.Neuromuscular: NegativeSkeletal/Skin:  Negative-Joint and Skeletal Disorders: arthritisGastrointestinal/Genitourinary:  Negative -Nutritional Disorders: Patient has has increased body weightHematological/Lymphatic: Negative-Coagulopathy:  Patient has blood dyscrasia.-Neoplastic Disorders:  He has lymphoma (NHL).Endocrine/Metabolic:  Negative.Behavioral/Psychiatric & Syndromes: NegativePhysical ExamCardiovascular:  normal exam  Rhythm: regularHeart Sounds: S1 present and S2 present.Pulmonary:  normal exam  Patient's breath sounds clear to auscultationAirway:  Mallampati: ITM distance: >3 FBNeck ROM: fullDental:  normal exam  Anesthesia PlanASA 2 The primary anesthesia plan is  MAC. Perioperative Code Status confirmed: It is my understanding that the patient is currently designated as 'Full Code' and will remain so throughout the perioperative period.Anesthesia informed consent obtained. Consent obtained from: patientUse of blood products: consented  The post operative pain plan is IV analgesics.Plan discussed with CRNA.Anesthesiologist's Pre Op NoteI personally evaluated and examined the patient prior to the intra-operative phase of care.  Risks and Benefits are discussed. . Risks Including but not limited to PONV, Awareness/Recall risks, transfusion related issues if indicated, Dental/Soft tissue in oral/Pharyngeal cavity damage, eye issues and position related nerve injury etc.

## 2019-07-22 NOTE — Interval H&P Note
Subsequent to admission for surgery or invasive procedure, I have reassessed the patient by examination and review of relevant data pertaining to the planned procedure. I have verified the planned procedure and there are no relevant changes since the H&P. .

## 2019-07-22 NOTE — Discharge Instructions
Important:1. Schedule follow-up appointment for 2 weeks from surgery.2. Take ibuprofen every 6 hours as needed for post-operative discomfort3. You may shower but avoid scrubbing over the incision site. There is skin glue over the incision that will flake away over time, do not pick at it!Open Lymph Node Biopsy, Care AfterThis sheet gives you information about how to care for yourself after your procedure. Your health care provider may also give you more specific instructions. If you have problems or questions, contact your health care provider.What can I expect after the procedure?After the procedure, it is common to have:?	Bruising.?	Soreness.?	Mild swelling.Follow these instructions at home:Medicines?	Take over-the-counter and prescription medicines only as told by your health care provider.?	If you were prescribed an antibiotic medicine, take it as told by your health care provider. Do not stop taking the antibiotic even if you start to feel better.Incision care?	Follow instructions from your health care provider about how to take care of your incision. Make sure you:?	Wash your hands with soap and water before and after you change your bandage (dressing). If soap and water are not available, use hand sanitizer.?	Change your dressing as told by your health care provider.?	Leave stitches (sutures), skin glue, or adhesive strips in place. These skin closures may need to stay in place for 2 weeks or longer. If adhesive strip edges start to loosen and curl up, you may trim the loose edges. Do not remove adhesive strips completely unless your health care provider tells you to do that.?	Check your incision area every day for signs of infection. Check for:?	More redness, swelling, or pain.?	Fluid or blood.?	Warmth.?	Pus or a bad smell.Driving?	Do not drive for 24 hours if you were given a sedative during your procedure.?	Do not drive or use heavy machinery while taking prescription pain medicine.General instructions?	Return to your normal activities as told by your health care provider. Ask your health care provider what activities are safe for you.?	Do not take baths, swim, or use a hot tub until your health care provider approves. Ask your health care provider if you may take showers. You may only be allowed to take sponge baths.?	Keep all follow-up visits as told by your health care provider. This is important.Contact a health care provider if:?	You have more redness, swelling, or pain around your incision.?	You have fluid or blood coming from your incision.?	Your incision feels warm to the touch.?	You have pus or a bad smell coming from your incision.?	You have a fever.?	You have pain or numbness that gets worse or lasts longer than a few days.Summary?	After a lymph node biopsy, it is common to have bruising, soreness, and mild swelling.?	Follow your health care provider's instructions about taking care of yourself at home. You will be told how to take medicines, take care of your incision, and check for infection.?	Return to your normal activities as told by your health care provider. Ask your health care provider what activities are safe for you.?	Contact a health care provider if you have more redness, swelling, or pain around your incision, you have a fever, or you have worsening pain or numbness.This information is not intended to replace advice given to you by your health care provider. Make sure you discuss any questions you have with your health care provider.Document Released: 05/13/2015 Document Revised: 11/21/2017 Document Reviewed: 07/25/2019Elsevier Patient Education ? 2020 Elsevier Inc[employer]

## 2019-07-22 NOTE — Brief Op Note
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 (HC Code) [C82.28]Mass of soft tissue [M79.89] Procedure(s) and Anesthesia Type:   * Excision/Biopsy of Lymph Node - Left Clavicle - MONITORED ANESTHESIA CAREOperative Findings (enter relevant operative findings; do not refer to an operative report that is not yet transcribed): enlarged lymph node removed in one pieceSigns of infection present at the time of surgery at the operative site: None Blood and Blood Products: none                 Drains:  noneImplants: * No implants in log * Specimens: ID Type Source Tests Collected by Time 1 : left supraclavicular lymph node Tissue Lymph Node PATHOLOGY ([facility]) Louis Meckel, MD 07/22/2019 12:50 PM  Clinical Staging: N/AEBL: None       Post Operative Diagnosis: Same as pre-op Julaine Hua, DMD3/24/20211:15 PM

## 2019-07-22 NOTE — Anesthesia Post-Procedure Evaluation
Anesthesia Post-op NotePatient: Patrick S RosenProcedure(s):  Procedure(s) (LRB):Excision/Biopsy of Lymph Node - Left Clavicle (Left) Patient location: PACULast Vitals:  I have noted the vital signs as listed in the nursing notes.Mental status recovered: patient participates in evaluation: YesVital signs reviewed: YesRespiratory function stable:YesAirway is patent: YesCardiovascular function and hydration status stable: YesPain control satisfactory: YesNausea and vomiting control satisfactory:Yes

## 2019-07-22 NOTE — Other
Post Anesthesia Transfer of Care NotePatient: Patrick S RosenProcedure(s) Performed: Procedure(s) (LRB):Excision/Biopsy of Lymph Node - Left Clavicle (Left) Patient location: PACU Last Vitals: Vitals Value Taken Time BP 110/68 07/22/19 1317 Temp 36.6 ?C 07/22/19 1317 Pulse 52 07/22/19 1323 Resp 11 07/22/19 1323 SpO2 98 % 07/22/19 1323 Vitals shown include unvalidated device data.Level of consciousness: awake, alert  and orientedTransport Vital Signs:  Stable since the last set of recorded intra-operative vital signsComplications: noneIntra-operative Intake & Output and Antibiotics as per Anesthesia record and discussed with the RN.

## 2019-07-22 NOTE — Other
General Surgery Operative Note PATIENT NAME: Patrick Casey DOB: Mar 29, 1961 MR: [id number] This is an operative report by Melonie Florida, DMDDate of surgery: 3/24/2021Attending surgeon: Lavona Mound, MDResident Surgeon: Melonie Florida, DMDPreoperative diagnosis: Left enlarged supraclavicular lymph nodePostoperative diagnosis: same. Operation: Excision biopsy of left enlarged supraclavicular lymph node EBL: NoneAnesthesia: MACProcedure: Patient was examined by anesthesia in the holding area and was determined to be fit for MAC for an elective procedure. Patient was brought into the operating room and positioned supine on the table. Following the induction of MAC anesthesia, the patient was prepped and draped in the customary manner for a sterile surgical procedure. The left enlarged supraclavicular lymph node was palpated and a marking pen was used to mark the borders. A 1.5 cm linear incision was make using a #15 blade in a layered fashion down to subcutaneous adipose tissue. Blunt dissection with a fine curved hemostat was performed along the surface of the lymph node. The bovi was used to aid dissection and to separate the lymph node from its base attachment. The specimen was sent to pathology and the surgical site was irrigated thoroughly. Hemostasis was achieved and 4-0 monocryl sutures were placed in a subcuticular fashion to achieve primary close of the surgical site. Dermabond was then placed over the wound. Following an uneventful emergence from MAC, the patient was transported to the recovery room. Dr. Joni Fears was present for the entire procedure. Dictated by: Melonie Florida, DMD

## 2019-07-22 NOTE — Other
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

## 2019-07-24 ENCOUNTER — Telehealth: Admit: 2019-07-24 | Payer: PRIVATE HEALTH INSURANCE | Attending: Trauma Surgery | Primary: Internal Medicine

## 2019-07-24 ENCOUNTER — Telehealth: Admit: 2019-07-24 | Payer: PRIVATE HEALTH INSURANCE | Primary: Internal Medicine

## 2019-07-24 DIAGNOSIS — Z23 Encounter for immunization: Secondary | ICD-10-CM | POA: Diagnosis not present

## 2019-07-24 NOTE — Telephone Encounter
[facility] MESSAGETime of call:   9:25 AMCaller:   RichardCaller's relationship to patient:  n/a  Calling from (pharmacy, hospital, agency, etc.):  n/a   Reason for call:   Pt calling to set up wound care appt for 2 weeks but next available for Dr. Joni Fears is not until 4/19. Please advise. If not feeling well, what are symptoms:  n/a   If having symptoms, how long have the symptoms been present:  n/a   Best telephone number for callback:   [phone] Best time to return call:   Anytime Permission to leave message:  yes   [facility]

## 2019-07-24 NOTE — Telephone Encounter
Returned patient's call regarding his upcoming new patient appointment with Dr. Raliegh Ip.  Patient understands that this is for second opinion should his lymph node biopsy show recurrence of his lymphoma.  Per patient, if biopsy is negative he will cancel this appointment and follow up with his current hematologist, Dr. Boneta Lucks, at [facility].

## 2019-07-27 ENCOUNTER — Encounter: Admit: 2019-07-27 | Payer: PRIVATE HEALTH INSURANCE | Attending: Trauma Surgery | Primary: Internal Medicine

## 2019-07-28 ENCOUNTER — Encounter: Admit: 2019-07-28 | Payer: PRIVATE HEALTH INSURANCE | Attending: Trauma Surgery | Primary: Internal Medicine

## 2019-07-29 ENCOUNTER — Telehealth: Admit: 2019-07-29 | Payer: PRIVATE HEALTH INSURANCE | Attending: Trauma Surgery | Primary: Internal Medicine

## 2019-07-29 NOTE — Telephone Encounter
Spoke to Patrick Casey by phone today after he called his PCP concerned about his wound. He states that he feels a small fluid collection under the skin at the site of the biopsy. Denies pain, tenderness, drainage or redness in the area. No signs of infection. Likely this is normal post-operative changes, however, he is still concerned, so we will schedule him to come in to be seen at [facility] tomorrow.

## 2019-07-30 ENCOUNTER — Encounter: Admit: 2019-07-30 | Payer: PRIVATE HEALTH INSURANCE | Attending: Surgery | Primary: Internal Medicine

## 2019-07-30 ENCOUNTER — Ambulatory Visit: Admit: 2019-07-30 | Payer: BLUE CROSS/BLUE SHIELD | Attending: Surgery | Primary: Internal Medicine

## 2019-07-30 DIAGNOSIS — M1990 Unspecified osteoarthritis, unspecified site: Secondary | ICD-10-CM

## 2019-07-30 DIAGNOSIS — C8590 Non-Hodgkin lymphoma, unspecified, unspecified site: Secondary | ICD-10-CM

## 2019-07-30 DIAGNOSIS — Z5189 Encounter for other specified aftercare: Secondary | ICD-10-CM

## 2019-07-31 ENCOUNTER — Telehealth: Admit: 2019-07-31 | Payer: PRIVATE HEALTH INSURANCE | Attending: Medical Oncology | Primary: Internal Medicine

## 2019-07-31 ENCOUNTER — Encounter: Admit: 2019-07-31 | Payer: PRIVATE HEALTH INSURANCE | Primary: Internal Medicine

## 2019-07-31 NOTE — Telephone Encounter
Pt selected wants to cancel for 4/5 on televox confirmation. Might need to resch.

## 2019-07-31 NOTE — Progress Notes
[facility] Follow-up Office NoteSubjective: Chief Complaint:Swelling around incision HPI: Patrick Casey is a 59 y.o. male patient with history of NHL who underwent excisional lymph node biopsy 07/22/19 with Dr. Joni Fears. He reports recovering well overall though noticed some swelling under the skin at his surgical site 2 days ago and presents today for evaluation. He denies drainage, pain or erythema at the site and denies fevers, or chills. He has no other complaints at this time. Past Medical History: Diagnosis Date ? Arthritis  ? NHL (non-Hodgkin's lymphoma) (HC Code)  Past Surgical History: Procedure Laterality Date ? fractured skull    age 88 ? HERNIA REPAIR   ? KNEE ARTHROSCOPY   ? left knee cartiledge repair   ? right hip repair   ? right knee cyst removal   ? right shoulder repair   ? SHOULDER SURGERY Left  Current Outpatient Medications on File Prior to Visit Medication Sig Dispense Refill ? calcium-vitamin D (CALCIUM-VITAMIN D) 500 mg(1,250mg ) -200 unit per tablet Take 1 tablet by mouth 2 (two) times daily (0800, 1800)..   ? glucosamine-chondroitin 500-400 mg tablet Take 1 tablet by mouth 3 (three) times daily.   ? multivitamin (MULTIPLE VITAMIN ORAL) Take 1 tablet by mouth daily.   ? tadalafiL (CIALIS) 20 mg tablet Take 1 tablet (20 mg total) by mouth daily as needed for erectile dysfunction. (Patient not taking: Reported on 07/17/2019) 120 tablet 3 No current facility-administered medications on file prior to visit.  Allergies: Patient has no known allergies.Objective: Physical Exam:There were no vitals taken for this visit.Constitutional: No apparent distress, alert and oriented Neuro: Grossly intact, no focal deficits Resp:  Breathing comfortably on room air CV: RRRExtremities: wwp. L. Supraclavicular incision well approximated with skin glue. Approximately 3cm area of fluctuance deep to incision. No noted drainage or erythema. Diagnostics:Pathology 07/22/19:FINAL DIAGNOSIS LYMPH NODE, LEFT SUPRACLAVICULAR, EXCISION BIOPSY: ? ? ?- REACTIVE LYMPHOID TISSUE, SEE NOTE. NOTE: The morphologic and immunophenotypic findings are consistent with a reactive lymph node. No evidence of lymphoma is noted in this specimen. Correlation with clinical, cytogenetic, and imaging findings is recommended.IMPRESSION/PLAN: Patrick Casey is a 59 y.o. male patient who underwent excisional biopsy of L. Supraclavicular node who presents to clinic today with swelling deep to the incision, likely a seroma. No signs of infection at this time. Discussed final pathology with patient, endorsed understanding. Has a Telehealth follow up appointment with Dr. Joni Fears next week. No further need for follow up in Trauma clinic. Plan:1.	No surgical intervention 2.	May apply compression dressing 3.	Follow up with Dr. Joni Fears next week via Telehealth  Electronically Signed by Martina Sinner, MD, April 1, 2021Trauma Surgery AttendingI saw and evaluated the patient. I agree with the findings and the plan of care as documented in the resident?s note with the following additions:  Fluctuant mass at the site of the lymph node biopsy. No skin changes or drainage or tenderness to suggest infection. Likely a seroma vs lymph leak. Observe for now. Has follow up with Dr. Joni Fears next week. Patient had already received path results via my chart on his own.Koleen Nimrod A. Delman Kitten, MD, FACS, FCCM4/05/2019

## 2019-08-03 ENCOUNTER — Ambulatory Visit: Admit: 2019-08-03 | Payer: PRIVATE HEALTH INSURANCE | Attending: Medical Oncology | Primary: Internal Medicine

## 2019-08-03 ENCOUNTER — Ambulatory Visit: Admit: 2019-08-03 | Payer: PRIVATE HEALTH INSURANCE | Primary: Internal Medicine

## 2019-08-07 ENCOUNTER — Encounter: Admit: 2019-08-07 | Payer: BLUE CROSS/BLUE SHIELD | Attending: Trauma Surgery | Primary: Internal Medicine

## 2019-08-10 ENCOUNTER — Other Ambulatory Visit: Payer: Self-pay

## 2019-08-10 ENCOUNTER — Inpatient Hospital Stay: Payer: BC Managed Care – PPO | Attending: Oncology

## 2019-08-10 ENCOUNTER — Telehealth: Payer: Self-pay

## 2019-08-10 DIAGNOSIS — C9211 Chronic myeloid leukemia, BCR/ABL-positive, in remission: Secondary | ICD-10-CM | POA: Diagnosis not present

## 2019-08-10 LAB — CMP (CANCER CENTER ONLY)
ALT: 20 U/L (ref 0–44)
AST: 28 U/L (ref 15–41)
Albumin: 4 g/dL (ref 3.5–5.0)
Alkaline Phosphatase: 81 U/L (ref 38–126)
Anion gap: 8 (ref 5–15)
BUN: 17 mg/dL (ref 6–20)
CO2: 31 mmol/L (ref 22–32)
Calcium: 9 mg/dL (ref 8.9–10.3)
Chloride: 100 mmol/L (ref 98–111)
Creatinine: 1.56 mg/dL — ABNORMAL HIGH (ref 0.61–1.24)
GFR, Est AFR Am: 56 mL/min — ABNORMAL LOW (ref >60)
GFR, Estimated: 48 mL/min — ABNORMAL LOW (ref >60)
Glucose, Bld: 110 mg/dL — ABNORMAL HIGH (ref 70–99)
Potassium: 3.7 mmol/L (ref 3.5–5.1)
Sodium: 139 mmol/L (ref 135–145)
Total Bilirubin: 0.5 mg/dL (ref 0.3–1.2)
Total Protein: 6.9 g/dL (ref 6.5–8.1)

## 2019-08-10 LAB — CBC WITH DIFFERENTIAL (CANCER CENTER ONLY)
Abs Immature Granulocytes: 0.01 10*3/uL (ref 0.00–0.07)
Basophils Absolute: 0.1 10*3/uL (ref 0.0–0.1)
Basophils Relative: 1 %
Eosinophils Absolute: 1.1 10*3/uL — ABNORMAL HIGH (ref 0.0–0.5)
Eosinophils Relative: 17 %
HCT: 33.6 % — ABNORMAL LOW (ref 39.0–52.0)
Hemoglobin: 11.1 g/dL — ABNORMAL LOW (ref 13.0–17.0)
Immature Granulocytes: 0 %
Lymphocytes Relative: 23 %
Lymphs Abs: 1.5 10*3/uL (ref 0.7–4.0)
MCH: 30.2 pg (ref 26.0–34.0)
MCHC: 33 g/dL (ref 30.0–36.0)
MCV: 91.6 fL (ref 80.0–100.0)
Monocytes Absolute: 0.5 10*3/uL (ref 0.1–1.0)
Monocytes Relative: 9 %
Neutro Abs: 3.2 10*3/uL (ref 1.7–7.7)
Neutrophils Relative %: 50 %
Platelet Count: 190 10*3/uL (ref 150–400)
RBC: 3.67 MIL/uL — ABNORMAL LOW (ref 4.22–5.81)
RDW: 12.7 % (ref 11.5–15.5)
WBC Count: 6.3 10*3/uL (ref 4.0–10.5)
nRBC: 0 % (ref 0.0–0.2)

## 2019-08-10 NOTE — Telephone Encounter (Signed)
Oral Oncology Patient Advocate Encounter  Received notification from [facility] prior authorization for [place] is required.  PA submitted on CoverMyMeds Key [id number] Status is pending  Oral Oncology Clinic will continue to follow.  [place] Patient [place] Phone [phone] Fax [phone] 08/10/2019 4:09 PM

## 2019-08-11 ENCOUNTER — Telehealth: Payer: Self-pay | Admitting: *Deleted

## 2019-08-11 NOTE — Telephone Encounter (Signed)
Left message that [place] refill is held up with insurance company awaiting PA. Request sent to office yesterday. Noted that Patrick Nash is already working on this. Notified patient via mychart.

## 2019-08-12 ENCOUNTER — Ambulatory Visit: Admit: 2019-08-12 | Payer: PRIVATE HEALTH INSURANCE | Attending: Medical Oncology | Primary: Internal Medicine

## 2019-08-12 ENCOUNTER — Ambulatory Visit: Admit: 2019-08-12 | Payer: PRIVATE HEALTH INSURANCE | Primary: Internal Medicine

## 2019-08-20 DIAGNOSIS — Z23 Encounter for immunization: Secondary | ICD-10-CM | POA: Diagnosis not present

## 2019-08-24 NOTE — Telephone Encounter (Signed)
Oral Oncology Patient Advocate Encounter  Prior Authorization for [place] has been approved.    PA# [id number] Effective dates: 08/10/19 through 08/08/20   Oral Oncology Clinic will continue to follow.   [place] Patient [place] Phone [phone] Fax [phone] 08/24/2019 2:06 PM

## 2019-09-16 MED ORDER — OMEPRAZOLE 40 MG CAPSULE,DELAYED RELEASE
40 | ORAL_CAPSULE | Freq: Every day | ORAL | 1 refills | 90.00000 days | Status: AC
Start: 2019-09-16 — End: 2019-09-29

## 2019-09-29 ENCOUNTER — Other Ambulatory Visit: Payer: Self-pay | Admitting: Oncology

## 2019-09-29 MED ORDER — OMEPRAZOLE 40 MG CAPSULE,DELAYED RELEASE
40 | ORAL_CAPSULE | Freq: Every day | ORAL | 1 refills | 90.00000 days | Status: AC
Start: 2019-09-29 — End: 2019-10-14

## 2019-10-03 ENCOUNTER — Inpatient Hospital Stay: Admit: 2019-10-03 | Discharge: 2019-10-03 | Payer: BLUE CROSS/BLUE SHIELD | Primary: Internal Medicine

## 2019-10-03 ENCOUNTER — Ambulatory Visit: Admit: 2019-10-03 | Payer: PRIVATE HEALTH INSURANCE | Attending: Family | Primary: Internal Medicine

## 2019-10-03 DIAGNOSIS — J069 Acute upper respiratory infection, unspecified: Secondary | ICD-10-CM

## 2019-10-03 DIAGNOSIS — Z20822 Contact with and (suspected) exposure to covid-19: Secondary | ICD-10-CM

## 2019-10-03 MED ORDER — AZITHROMYCIN 250 MG TABLET
250 | ORAL_TABLET | ORAL | 1 refills | 5.00000 days | Status: AC
Start: 2019-10-03 — End: ?

## 2019-10-13 DIAGNOSIS — I10 Essential (primary) hypertension: Secondary | ICD-10-CM | POA: Diagnosis not present

## 2019-10-13 DIAGNOSIS — Z0000 Encounter for general adult medical examination without abnormal findings: Secondary | ICD-10-CM | POA: Diagnosis not present

## 2019-10-13 DIAGNOSIS — Z125 Encounter for screening for malignant neoplasm of prostate: Secondary | ICD-10-CM | POA: Diagnosis not present

## 2019-10-14 MED ORDER — OMEPRAZOLE 40 MG CAPSULE,DELAYED RELEASE
40 | ORAL_CAPSULE | Freq: Every day | ORAL | 1 refills | 90.00000 days | Status: AC
Start: 2019-10-14 — End: 2020-04-18

## 2019-10-20 DIAGNOSIS — F329 Major depressive disorder, single episode, unspecified: Secondary | ICD-10-CM | POA: Diagnosis not present

## 2019-10-20 DIAGNOSIS — Z0000 Encounter for general adult medical examination without abnormal findings: Secondary | ICD-10-CM | POA: Diagnosis not present

## 2019-10-20 DIAGNOSIS — I10 Essential (primary) hypertension: Secondary | ICD-10-CM | POA: Diagnosis not present

## 2019-10-20 DIAGNOSIS — D649 Anemia, unspecified: Secondary | ICD-10-CM | POA: Diagnosis not present

## 2019-10-20 DIAGNOSIS — C9590 Leukemia, unspecified not having achieved remission: Secondary | ICD-10-CM | POA: Diagnosis not present

## 2019-10-20 DIAGNOSIS — R82998 Other abnormal findings in urine: Secondary | ICD-10-CM | POA: Diagnosis not present

## 2019-10-20 DIAGNOSIS — Z1331 Encounter for screening for depression: Secondary | ICD-10-CM | POA: Diagnosis not present

## 2019-10-22 ENCOUNTER — Other Ambulatory Visit: Payer: Self-pay | Admitting: Internal Medicine

## 2019-10-22 DIAGNOSIS — Z1212 Encounter for screening for malignant neoplasm of rectum: Secondary | ICD-10-CM | POA: Diagnosis not present

## 2019-10-22 DIAGNOSIS — E785 Hyperlipidemia, unspecified: Secondary | ICD-10-CM

## 2019-11-09 ENCOUNTER — Other Ambulatory Visit: Payer: Self-pay

## 2019-11-09 ENCOUNTER — Inpatient Hospital Stay: Payer: BC Managed Care – PPO

## 2019-11-09 ENCOUNTER — Telehealth: Payer: Self-pay | Admitting: Oncology

## 2019-11-09 ENCOUNTER — Inpatient Hospital Stay: Payer: BC Managed Care – PPO | Attending: Oncology | Admitting: Oncology

## 2019-11-09 VITALS — BP 142/80 | HR 79 | Temp 97.9°F | Resp 17 | Ht 68.0 in | Wt 151.0 lb

## 2019-11-09 DIAGNOSIS — R011 Cardiac murmur, unspecified: Secondary | ICD-10-CM | POA: Insufficient documentation

## 2019-11-09 DIAGNOSIS — I129 Hypertensive chronic kidney disease with stage 1 through stage 4 chronic kidney disease, or unspecified chronic kidney disease: Secondary | ICD-10-CM | POA: Insufficient documentation

## 2019-11-09 DIAGNOSIS — D649 Anemia, unspecified: Secondary | ICD-10-CM | POA: Insufficient documentation

## 2019-11-09 DIAGNOSIS — N189 Chronic kidney disease, unspecified: Secondary | ICD-10-CM | POA: Insufficient documentation

## 2019-11-09 DIAGNOSIS — C9210 Chronic myeloid leukemia, BCR/ABL-positive, not having achieved remission: Secondary | ICD-10-CM | POA: Diagnosis not present

## 2019-11-09 DIAGNOSIS — C9211 Chronic myeloid leukemia, BCR/ABL-positive, in remission: Secondary | ICD-10-CM

## 2019-11-09 DIAGNOSIS — D721 Eosinophilia, unspecified: Secondary | ICD-10-CM | POA: Diagnosis not present

## 2019-11-09 DIAGNOSIS — Z79899 Other long term (current) drug therapy: Secondary | ICD-10-CM | POA: Diagnosis not present

## 2019-11-09 LAB — CMP (CANCER CENTER ONLY)
ALT: 35 U/L (ref 0–44)
AST: 35 U/L (ref 15–41)
Albumin: 4.1 g/dL (ref 3.5–5.0)
Alkaline Phosphatase: 82 U/L (ref 38–126)
Anion gap: 9 (ref 5–15)
BUN: 18 mg/dL (ref 6–20)
CO2: 29 mmol/L (ref 22–32)
Calcium: 9.3 mg/dL (ref 8.9–10.3)
Chloride: 101 mmol/L (ref 98–111)
Creatinine: 1.66 mg/dL — ABNORMAL HIGH (ref 0.61–1.24)
GFR, Est AFR Am: 51 mL/min — ABNORMAL LOW (ref >60)
GFR, Estimated: 44 mL/min — ABNORMAL LOW (ref >60)
Glucose, Bld: 112 mg/dL — ABNORMAL HIGH (ref 70–99)
Potassium: 3.6 mmol/L (ref 3.5–5.1)
Sodium: 139 mmol/L (ref 135–145)
Total Bilirubin: 0.7 mg/dL (ref 0.3–1.2)
Total Protein: 7 g/dL (ref 6.5–8.1)

## 2019-11-09 LAB — CBC WITH DIFFERENTIAL (CANCER CENTER ONLY)
Abs Immature Granulocytes: 0.01 10*3/uL (ref 0.00–0.07)
Basophils Absolute: 0 10*3/uL (ref 0.0–0.1)
Basophils Relative: 1 %
Eosinophils Absolute: 0.8 10*3/uL — ABNORMAL HIGH (ref 0.0–0.5)
Eosinophils Relative: 19 %
HCT: 32.5 % — ABNORMAL LOW (ref 39.0–52.0)
Hemoglobin: 10.7 g/dL — ABNORMAL LOW (ref 13.0–17.0)
Immature Granulocytes: 0 %
Lymphocytes Relative: 26 %
Lymphs Abs: 1.1 10*3/uL (ref 0.7–4.0)
MCH: 30.1 pg (ref 26.0–34.0)
MCHC: 32.9 g/dL (ref 30.0–36.0)
MCV: 91.3 fL (ref 80.0–100.0)
Monocytes Absolute: 0.4 10*3/uL (ref 0.1–1.0)
Monocytes Relative: 10 %
Neutro Abs: 2 10*3/uL (ref 1.7–7.7)
Neutrophils Relative %: 44 %
Platelet Count: 156 10*3/uL (ref 150–400)
RBC: 3.56 MIL/uL — ABNORMAL LOW (ref 4.22–5.81)
RDW: 13 % (ref 11.5–15.5)
WBC Count: 4.4 10*3/uL (ref 4.0–10.5)
nRBC: 0 % (ref 0.0–0.2)

## 2019-11-09 NOTE — Telephone Encounter (Signed)
Scheduled appt per 7/12 los - pt is aware of appts.

## 2019-11-09 NOTE — Progress Notes (Signed)
  [facility] [place] OFFICE PROGRESS NOTE   Diagnosis: CML  INTERVAL HISTORY:   Mr. Kalman Shan returns for a scheduled visit.  He continues [place].  No rash, diarrhea, or edema.  He is exercising.  He has received the COVID-19 vaccine.  No complaint.  Objective:  Vital signs in last 24 hours:  Blood pressure (!) 142/80, pulse 79, temperature 97.9 F (36.6 C), temperature source Temporal, resp. rate 17, height 5\' 8"  (1.727 m), weight 151 lb (68.5 kg), SpO2 100 %.    HEENT: No thrush or ulcers Resp: Lungs clear bilaterally Cardio: Regular rate and rhythm GI: No hepatosplenomegaly Vascular: No leg edema  Skin: No rash  Portacath/PICC-without erythema  Lab Results:  Lab Results  Component Value Date   WBC 4.4 11/09/2019   HGB 10.7 (L) 11/09/2019   HCT 32.5 (L) 11/09/2019   MCV 91.3 11/09/2019   PLT 156 11/09/2019   NEUTROABS 2.0 11/09/2019    CMP  Lab Results  Component Value Date   NA 139 11/09/2019   K 3.6 11/09/2019   CL 101 11/09/2019   CO2 29 11/09/2019   GLUCOSE 112 (H) 11/09/2019   BUN 18 11/09/2019   CREATININE 1.66 (H) 11/09/2019   CALCIUM 9.3 11/09/2019   PROT 7.0 11/09/2019   ALBUMIN 4.1 11/09/2019   AST 35 11/09/2019   ALT 35 11/09/2019   ALKPHOS 82 11/09/2019   BILITOT 0.7 11/09/2019   GFRNONAA 44 (L) 11/09/2019   GFRAA 51 (L) 11/09/2019    Medications: I have reviewed the patient's current medications.   Assessment/Plan: 1. CML diagnosed in 2004  Initial molecular remission with Gleevec  Gleevec discontinued April 2017, resumed in June 2017 after a rise in the peripheral blood BCR-ABL  Gleevec resumed and he again entered molecular remission 2. Hypertension 3. Chronic renal insufficiency 4. Chronic eosinophilia 5. Systolic heart murmur noted on exam 11/13/2018    Disposition: Mr. Achille remains in hematologic and cytogenetic remission from [facility].  The peripheral blood PCR was negative in January.  We will follow up on the PCR from  today.  The mild anemia is chronic and likely related to [place] therapy.  He will return for lab visit in 3 months and an office visit in 6 months.  He will call in the interim as needed.  Betsy Coder, MD  11/09/2019  12:42 PM

## 2019-11-18 ENCOUNTER — Telehealth: Payer: Self-pay

## 2019-11-18 DIAGNOSIS — Z23 Encounter for immunization: Secondary | ICD-10-CM | POA: Diagnosis not present

## 2019-11-18 LAB — BCR/ABL

## 2019-11-18 NOTE — Telephone Encounter (Signed)
-----   Message from Ladell Pier, MD sent at 11/18/2019  1:37 PM EDT ----- Please call patient, very low level CML translocation detected, remains in a major molecular remission, continue Gleevec at the same dose, repeat with labs in 3 months

## 2019-11-18 NOTE — Telephone Encounter (Signed)
Spoke with pt made him aware of recent results and follow up instruction

## 2019-11-27 ENCOUNTER — Other Ambulatory Visit: Payer: Self-pay | Admitting: Oncology

## 2019-12-21 ENCOUNTER — Ambulatory Visit
Admission: RE | Admit: 2019-12-21 | Discharge: 2019-12-21 | Disposition: A | Discharge status: Home / Self Care | Payer: Self-pay | Source: Ambulatory Visit | Attending: Internal Medicine | Admitting: Internal Medicine

## 2019-12-21 DIAGNOSIS — E785 Hyperlipidemia, unspecified: Secondary | ICD-10-CM

## 2020-01-27 ENCOUNTER — Other Ambulatory Visit: Payer: Self-pay | Admitting: Oncology

## 2020-02-03 DIAGNOSIS — Z23 Encounter for immunization: Secondary | ICD-10-CM | POA: Diagnosis not present

## 2020-02-09 ENCOUNTER — Other Ambulatory Visit: Payer: Self-pay

## 2020-02-09 ENCOUNTER — Telehealth: Payer: Self-pay | Admitting: *Deleted

## 2020-02-09 ENCOUNTER — Inpatient Hospital Stay: Payer: BC Managed Care – PPO | Attending: Oncology

## 2020-02-09 DIAGNOSIS — D649 Anemia, unspecified: Secondary | ICD-10-CM | POA: Diagnosis not present

## 2020-02-09 DIAGNOSIS — D696 Thrombocytopenia, unspecified: Secondary | ICD-10-CM | POA: Diagnosis not present

## 2020-02-09 DIAGNOSIS — R011 Cardiac murmur, unspecified: Secondary | ICD-10-CM | POA: Insufficient documentation

## 2020-02-09 DIAGNOSIS — C9211 Chronic myeloid leukemia, BCR/ABL-positive, in remission: Secondary | ICD-10-CM | POA: Diagnosis not present

## 2020-02-09 DIAGNOSIS — D721 Eosinophilia, unspecified: Secondary | ICD-10-CM | POA: Diagnosis not present

## 2020-02-09 DIAGNOSIS — I129 Hypertensive chronic kidney disease with stage 1 through stage 4 chronic kidney disease, or unspecified chronic kidney disease: Secondary | ICD-10-CM | POA: Insufficient documentation

## 2020-02-09 DIAGNOSIS — N189 Chronic kidney disease, unspecified: Secondary | ICD-10-CM | POA: Insufficient documentation

## 2020-02-09 LAB — CBC WITH DIFFERENTIAL (CANCER CENTER ONLY)
Abs Immature Granulocytes: 0.01 10*3/uL (ref 0.00–0.07)
Basophils Absolute: 0.1 10*3/uL (ref 0.0–0.1)
Basophils Relative: 1 %
Eosinophils Absolute: 1.6 10*3/uL — ABNORMAL HIGH (ref 0.0–0.5)
Eosinophils Relative: 25 %
HCT: 29.9 % — ABNORMAL LOW (ref 39.0–52.0)
Hemoglobin: 9.9 g/dL — ABNORMAL LOW (ref 13.0–17.0)
Immature Granulocytes: 0 %
Lymphocytes Relative: 21 %
Lymphs Abs: 1.3 10*3/uL (ref 0.7–4.0)
MCH: 30.6 pg (ref 26.0–34.0)
MCHC: 33.1 g/dL (ref 30.0–36.0)
MCV: 92.3 fL (ref 80.0–100.0)
Monocytes Absolute: 0.7 10*3/uL (ref 0.1–1.0)
Monocytes Relative: 11 %
Neutro Abs: 2.6 10*3/uL (ref 1.7–7.7)
Neutrophils Relative %: 42 %
Platelet Count: 122 10*3/uL — ABNORMAL LOW (ref 150–400)
RBC: 3.24 MIL/uL — ABNORMAL LOW (ref 4.22–5.81)
RDW: 12.7 % (ref 11.5–15.5)
WBC Count: 6.2 10*3/uL (ref 4.0–10.5)
nRBC: 0 % (ref 0.0–0.2)

## 2020-02-09 LAB — CMP (CANCER CENTER ONLY)
ALT: 50 U/L — ABNORMAL HIGH (ref 0–44)
AST: 40 U/L (ref 15–41)
Albumin: 4 g/dL (ref 3.5–5.0)
Alkaline Phosphatase: 105 U/L (ref 38–126)
Anion gap: 3 — ABNORMAL LOW (ref 5–15)
BUN: 26 mg/dL — ABNORMAL HIGH (ref 6–20)
CO2: 33 mmol/L — ABNORMAL HIGH (ref 22–32)
Calcium: 9.3 mg/dL (ref 8.9–10.3)
Chloride: 102 mmol/L (ref 98–111)
Creatinine: 2.28 mg/dL — ABNORMAL HIGH (ref 0.61–1.24)
GFR, Estimated: 30 mL/min — ABNORMAL LOW (ref >60)
Glucose, Bld: 90 mg/dL (ref 70–99)
Potassium: 4.1 mmol/L (ref 3.5–5.1)
Sodium: 138 mmol/L (ref 135–145)
Total Bilirubin: 0.6 mg/dL (ref 0.3–1.2)
Total Protein: 6.8 g/dL (ref 6.5–8.1)

## 2020-02-09 NOTE — Telephone Encounter (Signed)
-----   Message from Ladell Pier, MD sent at 02/09/2020  4:28 PM EDT ----- Please call patient, hb and platelets are lower, hold gleevec, creatinine is higher, he needs to schedule an appt. With Dr. Joylene Draft ASAP- may want adjust medical regimen or make renal referral  Return for cbc, bmp 1 week  Office visit 10/27 11:30 AM

## 2020-02-09 NOTE — Telephone Encounter (Signed)
LM with note below. To call tomorrow if has questions.  Orders placed, message to scheduler

## 2020-02-10 ENCOUNTER — Telehealth: Payer: Self-pay | Admitting: Oncology

## 2020-02-10 NOTE — Telephone Encounter (Signed)
Scheduled appt per 10/12 sch msg - pt is aware.

## 2020-02-11 DIAGNOSIS — D649 Anemia, unspecified: Secondary | ICD-10-CM | POA: Diagnosis not present

## 2020-02-11 DIAGNOSIS — R8290 Unspecified abnormal findings in urine: Secondary | ICD-10-CM | POA: Diagnosis not present

## 2020-02-11 DIAGNOSIS — C9590 Leukemia, unspecified not having achieved remission: Secondary | ICD-10-CM | POA: Diagnosis not present

## 2020-02-11 DIAGNOSIS — I10 Essential (primary) hypertension: Secondary | ICD-10-CM | POA: Diagnosis not present

## 2020-02-12 ENCOUNTER — Encounter: Payer: Self-pay | Admitting: Oncology

## 2020-02-15 ENCOUNTER — Telehealth: Payer: Self-pay | Admitting: *Deleted

## 2020-02-15 LAB — BCR/ABL

## 2020-02-15 NOTE — Telephone Encounter (Signed)
-----   Message from Ladell Pier, MD sent at 02/15/2020  1:24 PM EDT ----- Please call patient, the PCR is negative for the BCR/ABL translocation, follow-up as scheduled, hold Gleevec until the next visit

## 2020-02-15 NOTE — Telephone Encounter (Signed)
Notified patient of negative BCR/ABL translocation. He will continue to hold the [place]. Reports that his ferritin was in normal range and he is now taking the dyazide qod.

## 2020-02-16 ENCOUNTER — Other Ambulatory Visit: Payer: Self-pay

## 2020-02-16 ENCOUNTER — Inpatient Hospital Stay: Payer: BC Managed Care – PPO

## 2020-02-16 DIAGNOSIS — N189 Chronic kidney disease, unspecified: Secondary | ICD-10-CM | POA: Diagnosis not present

## 2020-02-16 DIAGNOSIS — D649 Anemia, unspecified: Secondary | ICD-10-CM | POA: Diagnosis not present

## 2020-02-16 DIAGNOSIS — R011 Cardiac murmur, unspecified: Secondary | ICD-10-CM | POA: Diagnosis not present

## 2020-02-16 DIAGNOSIS — C9211 Chronic myeloid leukemia, BCR/ABL-positive, in remission: Secondary | ICD-10-CM | POA: Diagnosis not present

## 2020-02-16 DIAGNOSIS — I129 Hypertensive chronic kidney disease with stage 1 through stage 4 chronic kidney disease, or unspecified chronic kidney disease: Secondary | ICD-10-CM | POA: Diagnosis not present

## 2020-02-16 DIAGNOSIS — D721 Eosinophilia, unspecified: Secondary | ICD-10-CM | POA: Diagnosis not present

## 2020-02-16 DIAGNOSIS — D696 Thrombocytopenia, unspecified: Secondary | ICD-10-CM | POA: Diagnosis not present

## 2020-02-16 LAB — BASIC METABOLIC PANEL - CANCER CENTER ONLY
Anion gap: 4 — ABNORMAL LOW (ref 5–15)
BUN: 26 mg/dL — ABNORMAL HIGH (ref 6–20)
CO2: 31 mmol/L (ref 22–32)
Calcium: 9.4 mg/dL (ref 8.9–10.3)
Chloride: 103 mmol/L (ref 98–111)
Creatinine: 2.02 mg/dL — ABNORMAL HIGH (ref 0.61–1.24)
GFR, Estimated: 35 mL/min — ABNORMAL LOW (ref >60)
Glucose, Bld: 85 mg/dL (ref 70–99)
Potassium: 4 mmol/L (ref 3.5–5.1)
Sodium: 138 mmol/L (ref 135–145)

## 2020-02-16 LAB — CBC WITH DIFFERENTIAL (CANCER CENTER ONLY)
Abs Immature Granulocytes: 0.01 10*3/uL (ref 0.00–0.07)
Basophils Absolute: 0.1 10*3/uL (ref 0.0–0.1)
Basophils Relative: 1 %
Eosinophils Absolute: 1.3 10*3/uL — ABNORMAL HIGH (ref 0.0–0.5)
Eosinophils Relative: 23 %
HCT: 29.6 % — ABNORMAL LOW (ref 39.0–52.0)
Hemoglobin: 9.8 g/dL — ABNORMAL LOW (ref 13.0–17.0)
Immature Granulocytes: 0 %
Lymphocytes Relative: 19 %
Lymphs Abs: 1.1 10*3/uL (ref 0.7–4.0)
MCH: 30.4 pg (ref 26.0–34.0)
MCHC: 33.1 g/dL (ref 30.0–36.0)
MCV: 91.9 fL (ref 80.0–100.0)
Monocytes Absolute: 1.1 10*3/uL — ABNORMAL HIGH (ref 0.1–1.0)
Monocytes Relative: 19 %
Neutro Abs: 2.2 10*3/uL (ref 1.7–7.7)
Neutrophils Relative %: 38 %
Platelet Count: 130 10*3/uL — ABNORMAL LOW (ref 150–400)
RBC: 3.22 MIL/uL — ABNORMAL LOW (ref 4.22–5.81)
RDW: 12.8 % (ref 11.5–15.5)
WBC Count: 5.7 10*3/uL (ref 4.0–10.5)
nRBC: 0 % (ref 0.0–0.2)

## 2020-02-17 ENCOUNTER — Other Ambulatory Visit: Payer: Self-pay | Admitting: Internal Medicine

## 2020-02-17 ENCOUNTER — Encounter: Payer: Self-pay | Admitting: *Deleted

## 2020-02-17 ENCOUNTER — Ambulatory Visit
Admission: RE | Admit: 2020-02-17 | Discharge: 2020-02-17 | Disposition: A | Discharge status: Home / Self Care | Payer: BC Managed Care – PPO | Source: Ambulatory Visit | Attending: Internal Medicine | Admitting: Internal Medicine

## 2020-02-17 DIAGNOSIS — N179 Acute kidney failure, unspecified: Secondary | ICD-10-CM

## 2020-02-24 ENCOUNTER — Other Ambulatory Visit: Payer: Self-pay

## 2020-02-24 ENCOUNTER — Inpatient Hospital Stay (HOSPITAL_BASED_OUTPATIENT_CLINIC_OR_DEPARTMENT_OTHER): Payer: BC Managed Care – PPO | Admitting: Oncology

## 2020-02-24 VITALS — BP 145/71 | HR 70 | Temp 97.0°F | Resp 16 | Ht 68.0 in | Wt 146.8 lb

## 2020-02-24 DIAGNOSIS — C9211 Chronic myeloid leukemia, BCR/ABL-positive, in remission: Secondary | ICD-10-CM

## 2020-02-24 DIAGNOSIS — D721 Eosinophilia, unspecified: Secondary | ICD-10-CM | POA: Diagnosis not present

## 2020-02-24 DIAGNOSIS — I129 Hypertensive chronic kidney disease with stage 1 through stage 4 chronic kidney disease, or unspecified chronic kidney disease: Secondary | ICD-10-CM | POA: Diagnosis not present

## 2020-02-24 DIAGNOSIS — D696 Thrombocytopenia, unspecified: Secondary | ICD-10-CM | POA: Diagnosis not present

## 2020-02-24 DIAGNOSIS — D649 Anemia, unspecified: Secondary | ICD-10-CM | POA: Diagnosis not present

## 2020-02-24 DIAGNOSIS — N189 Chronic kidney disease, unspecified: Secondary | ICD-10-CM | POA: Diagnosis not present

## 2020-02-24 DIAGNOSIS — R011 Cardiac murmur, unspecified: Secondary | ICD-10-CM | POA: Diagnosis not present

## 2020-02-24 NOTE — Progress Notes (Signed)
  Patrick Nash OFFICE PROGRESS NOTE   Diagnosis: CML  INTERVAL HISTORY:   Patrick Nash returns as scheduled.  Patrick Nash was placed on hold 02/09/2020 secondary to progressive anemia/thrombocytopenia and an elevated creatinine.  Dr. Haynes Kerns obtained a renal ultrasound on 02/17/2020.  This confirmed mildly increased renal echogenicity suggesting medical renal disease.  He feels well.  He is exercising.  Objective:  Vital signs in last 24 hours:  Blood pressure (!) 145/71, pulse 70, temperature (!) 97 F (36.1 C), temperature source Tympanic, resp. rate 16, height 5\' 8"  (1.727 m), weight 146 lb 12.8 oz (66.6 kg), SpO2 100 %.    Resp: Lungs clear bilaterally Cardio: Regular rate and rhythm GI: No hepatosplenomegaly Vascular: No leg edema =  Lab Results:  Lab Results  Component Value Date   WBC 5.7 02/16/2020   HGB 9.8 (L) 02/16/2020   HCT 29.6 (L) 02/16/2020   MCV 91.9 02/16/2020   PLT 130 (L) 02/16/2020   NEUTROABS 2.2 02/16/2020    CMP  Lab Results  Component Value Date   NA 138 02/16/2020   K 4.0 02/16/2020   CL 103 02/16/2020   CO2 31 02/16/2020   GLUCOSE 85 02/16/2020   BUN 26 (H) 02/16/2020   CREATININE 2.02 (H) 02/16/2020   CALCIUM 9.4 02/16/2020   PROT 6.8 02/09/2020   ALBUMIN 4.0 02/09/2020   AST 40 02/09/2020   ALT 50 (H) 02/09/2020   ALKPHOS 105 02/09/2020   BILITOT 0.6 02/09/2020   GFRNONAA 35 (L) 02/16/2020   GFRAA 51 (L) 11/09/2019   02/09/2020: Peripheral blood PCR-negative  Medications: I have reviewed the patient's current medications.   Assessment/Plan: 1. CML diagnosed in 2004  Initial molecular remission with Patrick Nash  Patrick Nash discontinued April 2017, resumed in June 2017 after a rise in the peripheral blood BCR-ABL  Patrick Nash resumed and he again entered molecular remission  Peripheral blood PCR detectable 11/09/2019, not detected 02/09/2020  Patrick Nash decreased to 200 mg daily beginning 02/24/2020 secondary to  anemia/thrombocytopenia 2. Hypertension 3. Chronic renal insufficiency 4. Chronic eosinophilia 5. Systolic heart murmur noted on exam 11/13/2018 6. Anemia secondary to Patrick Nash and renal insufficiency     Disposition: Mr. Needs appears stable.  The peripheral blood PCR was negative on 02/09/2020.  He has developed progressive anemia and mild thrombocytopenia over the past few months.  This occurs in the setting of progressive renal insufficiency.  I suspect the anemia and thrombocytopenia are related to increased bone marrow toxicity from [place] in the setting of renal insufficiency.  He will decrease the Patrick Nash dose to 200 mg daily.  He has split the 400 mg pills in the past.  He will return for a lab visit in 3 weeks.  We will decide on Patrick Nash dosing and subsequent follow-up based on the labs in 3 weeks.  We will contact Dr. Joylene Draft to discuss the indication for a nephrology referral.  Patrick Coder, MD  02/24/2020  9:26 AM

## 2020-02-25 ENCOUNTER — Telehealth: Payer: Self-pay | Admitting: *Deleted

## 2020-02-25 NOTE — Telephone Encounter (Signed)
Faxed recent office note to Dr. Joylene Draft and left message on his CMA's VM asking if based on labs and renal US will he be referring patient to renal or does he need Dr. Benay Spice to make referral. Provided direct RN phone # to call back.

## 2020-02-26 ENCOUNTER — Telehealth: Payer: Self-pay | Admitting: *Deleted

## 2020-02-26 NOTE — Telephone Encounter (Signed)
Called and left message asking if it would be agreeable to Dr. Benay Spice for him to take the [place] 400 mg x 3 days with 1 day break and resume? Also, Dr. Joylene Draft has made referral to nephrology per Kristin Bruins at [facility].

## 2020-02-29 NOTE — Telephone Encounter (Signed)
Left VM that Dr. Benay Spice prefers he stay on the 200 mg daily dosing of Gleevec.

## 2020-03-10 ENCOUNTER — Encounter: Payer: Self-pay | Admitting: *Deleted

## 2020-03-11 DIAGNOSIS — N179 Acute kidney failure, unspecified: Secondary | ICD-10-CM | POA: Diagnosis not present

## 2020-03-11 DIAGNOSIS — N183 Chronic kidney disease, stage 3 unspecified: Secondary | ICD-10-CM | POA: Diagnosis not present

## 2020-03-11 DIAGNOSIS — I129 Hypertensive chronic kidney disease with stage 1 through stage 4 chronic kidney disease, or unspecified chronic kidney disease: Secondary | ICD-10-CM | POA: Diagnosis not present

## 2020-03-11 DIAGNOSIS — D721 Eosinophilia, unspecified: Secondary | ICD-10-CM | POA: Diagnosis not present

## 2020-03-14 ENCOUNTER — Inpatient Hospital Stay: Payer: BC Managed Care – PPO | Attending: Oncology

## 2020-03-14 ENCOUNTER — Other Ambulatory Visit: Payer: Self-pay

## 2020-03-14 DIAGNOSIS — C9211 Chronic myeloid leukemia, BCR/ABL-positive, in remission: Secondary | ICD-10-CM | POA: Insufficient documentation

## 2020-03-14 LAB — CBC WITH DIFFERENTIAL (CANCER CENTER ONLY)
Abs Immature Granulocytes: 0.02 10*3/uL (ref 0.00–0.07)
Basophils Absolute: 0.1 10*3/uL (ref 0.0–0.1)
Basophils Relative: 1 %
Eosinophils Absolute: 1.3 10*3/uL — ABNORMAL HIGH (ref 0.0–0.5)
Eosinophils Relative: 18 %
HCT: 31 % — ABNORMAL LOW (ref 39.0–52.0)
Hemoglobin: 10.1 g/dL — ABNORMAL LOW (ref 13.0–17.0)
Immature Granulocytes: 0 %
Lymphocytes Relative: 19 %
Lymphs Abs: 1.3 10*3/uL (ref 0.7–4.0)
MCH: 30.1 pg (ref 26.0–34.0)
MCHC: 32.6 g/dL (ref 30.0–36.0)
MCV: 92.3 fL (ref 80.0–100.0)
Monocytes Absolute: 0.6 10*3/uL (ref 0.1–1.0)
Monocytes Relative: 9 %
Neutro Abs: 3.7 10*3/uL (ref 1.7–7.7)
Neutrophils Relative %: 53 %
Platelet Count: 184 10*3/uL (ref 150–400)
RBC: 3.36 MIL/uL — ABNORMAL LOW (ref 4.22–5.81)
RDW: 12.7 % (ref 11.5–15.5)
WBC Count: 7 10*3/uL (ref 4.0–10.5)
nRBC: 0 % (ref 0.0–0.2)

## 2020-03-14 LAB — CMP (CANCER CENTER ONLY)
ALT: 31 U/L (ref 0–44)
AST: 33 U/L (ref 15–41)
Albumin: 3.9 g/dL (ref 3.5–5.0)
Alkaline Phosphatase: 88 U/L (ref 38–126)
Anion gap: 8 (ref 5–15)
BUN: 25 mg/dL — ABNORMAL HIGH (ref 6–20)
CO2: 29 mmol/L (ref 22–32)
Calcium: 9.2 mg/dL (ref 8.9–10.3)
Chloride: 103 mmol/L (ref 98–111)
Creatinine: 1.89 mg/dL — ABNORMAL HIGH (ref 0.61–1.24)
GFR, Estimated: 40 mL/min — ABNORMAL LOW (ref >60)
Glucose, Bld: 103 mg/dL — ABNORMAL HIGH (ref 70–99)
Potassium: 4 mmol/L (ref 3.5–5.1)
Sodium: 140 mmol/L (ref 135–145)
Total Bilirubin: 0.6 mg/dL (ref 0.3–1.2)
Total Protein: 6.8 g/dL (ref 6.5–8.1)

## 2020-03-15 ENCOUNTER — Other Ambulatory Visit: Payer: Self-pay | Admitting: *Deleted

## 2020-03-15 ENCOUNTER — Telehealth: Payer: Self-pay | Admitting: Oncology

## 2020-03-15 ENCOUNTER — Encounter: Payer: Self-pay | Admitting: *Deleted

## 2020-03-15 DIAGNOSIS — C9211 Chronic myeloid leukemia, BCR/ABL-positive, in remission: Secondary | ICD-10-CM

## 2020-03-15 NOTE — Progress Notes (Signed)
Per Dr. Benay Spice: Labs improved. Resume Gleevec 200 mg daily. Recheck labs in 1 month. Notified patient via Mychart message. Scheduling message sent as well.

## 2020-03-15 NOTE — Telephone Encounter (Signed)
Scheduled appt per 11/16 sch msg - left message for patient with appt date and time  ° °

## 2020-03-16 ENCOUNTER — Other Ambulatory Visit: Payer: Self-pay | Admitting: *Deleted

## 2020-03-21 ENCOUNTER — Other Ambulatory Visit: Payer: Self-pay | Admitting: *Deleted

## 2020-03-21 ENCOUNTER — Encounter: Payer: Self-pay | Admitting: *Deleted

## 2020-03-21 MED ORDER — IMATINIB MESYLATE 400 MG PO TABS
ORAL_TABLET | ORAL | 1 refills | Status: DC
Start: 2020-03-21 — End: 2020-03-21

## 2020-03-21 MED ORDER — IMATINIB MESYLATE 400 MG PO TABS
ORAL_TABLET | ORAL | 1 refills | Status: DC
Start: 2020-03-21 — End: 2020-03-28

## 2020-03-28 ENCOUNTER — Other Ambulatory Visit: Payer: Self-pay | Admitting: *Deleted

## 2020-03-28 MED ORDER — IMATINIB MESYLATE 400 MG PO TABS
200.0000 mg | ORAL_TABLET | Freq: Every day | ORAL | 1 refills | Status: DC
Start: 2020-03-28 — End: 2020-06-03

## 2020-03-28 NOTE — Telephone Encounter (Signed)
Fax from [facility] requesting clarification of [place] script. Script written and signed by MD and faxed back to [phone]

## 2020-04-05 DIAGNOSIS — N183 Chronic kidney disease, stage 3 unspecified: Secondary | ICD-10-CM | POA: Diagnosis not present

## 2020-04-11 DIAGNOSIS — H5203 Hypermetropia, bilateral: Secondary | ICD-10-CM | POA: Diagnosis not present

## 2020-04-13 DIAGNOSIS — I129 Hypertensive chronic kidney disease with stage 1 through stage 4 chronic kidney disease, or unspecified chronic kidney disease: Secondary | ICD-10-CM | POA: Diagnosis not present

## 2020-04-13 DIAGNOSIS — N179 Acute kidney failure, unspecified: Secondary | ICD-10-CM | POA: Diagnosis not present

## 2020-04-13 DIAGNOSIS — N183 Chronic kidney disease, stage 3 unspecified: Secondary | ICD-10-CM | POA: Diagnosis not present

## 2020-04-13 DIAGNOSIS — D631 Anemia in chronic kidney disease: Secondary | ICD-10-CM | POA: Diagnosis not present

## 2020-04-14 ENCOUNTER — Telehealth: Payer: Self-pay

## 2020-04-14 ENCOUNTER — Other Ambulatory Visit: Payer: Self-pay

## 2020-04-14 ENCOUNTER — Inpatient Hospital Stay: Payer: BC Managed Care – PPO | Attending: Oncology

## 2020-04-14 DIAGNOSIS — C9211 Chronic myeloid leukemia, BCR/ABL-positive, in remission: Secondary | ICD-10-CM | POA: Diagnosis not present

## 2020-04-14 LAB — CBC WITH DIFFERENTIAL (CANCER CENTER ONLY)
Abs Immature Granulocytes: 0.01 10*3/uL (ref 0.00–0.07)
Basophils Absolute: 0.1 10*3/uL (ref 0.0–0.1)
Basophils Relative: 1 %
Eosinophils Absolute: 1.3 10*3/uL — ABNORMAL HIGH (ref 0.0–0.5)
Eosinophils Relative: 17 %
HCT: 31.9 % — ABNORMAL LOW (ref 39.0–52.0)
Hemoglobin: 10.3 g/dL — ABNORMAL LOW (ref 13.0–17.0)
Immature Granulocytes: 0 %
Lymphocytes Relative: 20 %
Lymphs Abs: 1.4 10*3/uL (ref 0.7–4.0)
MCH: 29.1 pg (ref 26.0–34.0)
MCHC: 32.3 g/dL (ref 30.0–36.0)
MCV: 90.1 fL (ref 80.0–100.0)
Monocytes Absolute: 0.8 10*3/uL (ref 0.1–1.0)
Monocytes Relative: 11 %
Neutro Abs: 3.7 10*3/uL (ref 1.7–7.7)
Neutrophils Relative %: 51 %
Platelet Count: 177 10*3/uL (ref 150–400)
RBC: 3.54 MIL/uL — ABNORMAL LOW (ref 4.22–5.81)
RDW: 12.6 % (ref 11.5–15.5)
WBC Count: 7.2 10*3/uL (ref 4.0–10.5)
nRBC: 0 % (ref 0.0–0.2)

## 2020-04-14 LAB — CMP (CANCER CENTER ONLY)
ALT: 51 U/L — ABNORMAL HIGH (ref 0–44)
AST: 45 U/L — ABNORMAL HIGH (ref 15–41)
Albumin: 4 g/dL (ref 3.5–5.0)
Alkaline Phosphatase: 89 U/L (ref 38–126)
Anion gap: 5 (ref 5–15)
BUN: 21 mg/dL — ABNORMAL HIGH (ref 6–20)
CO2: 30 mmol/L (ref 22–32)
Calcium: 9.4 mg/dL (ref 8.9–10.3)
Chloride: 104 mmol/L (ref 98–111)
Creatinine: 1.69 mg/dL — ABNORMAL HIGH (ref 0.61–1.24)
GFR, Estimated: 46 mL/min — ABNORMAL LOW (ref >60)
Glucose, Bld: 94 mg/dL (ref 70–99)
Potassium: 4 mmol/L (ref 3.5–5.1)
Sodium: 139 mmol/L (ref 135–145)
Total Bilirubin: 0.6 mg/dL (ref 0.3–1.2)
Total Protein: 7.1 g/dL (ref 6.5–8.1)

## 2020-04-14 NOTE — Telephone Encounter (Signed)
Made patient aware of most recent labs and appt for follow up

## 2020-04-14 NOTE — Telephone Encounter (Signed)
-----   Message from Ladell Pier, MD sent at 04/14/2020  1:41 PM EST ----- Please call patient, hemoglobin, platelets, and creatinine are stable, continue Gleevec at current dose, return for a lab and office visit as scheduled in 1 month

## 2020-05-10 ENCOUNTER — Inpatient Hospital Stay: Payer: BC Managed Care – PPO

## 2020-05-10 ENCOUNTER — Inpatient Hospital Stay: Payer: BC Managed Care – PPO | Attending: Oncology | Admitting: Oncology

## 2020-05-10 ENCOUNTER — Telehealth: Payer: Self-pay | Admitting: Oncology

## 2020-05-10 ENCOUNTER — Telehealth: Payer: Self-pay

## 2020-05-10 ENCOUNTER — Other Ambulatory Visit: Payer: Self-pay

## 2020-05-10 ENCOUNTER — Encounter: Payer: Self-pay | Admitting: *Deleted

## 2020-05-10 VITALS — BP 138/76 | HR 80 | Temp 97.8°F | Resp 18 | Ht 68.0 in | Wt 148.7 lb

## 2020-05-10 DIAGNOSIS — R945 Abnormal results of liver function studies: Secondary | ICD-10-CM | POA: Diagnosis not present

## 2020-05-10 DIAGNOSIS — D721 Eosinophilia, unspecified: Secondary | ICD-10-CM | POA: Insufficient documentation

## 2020-05-10 DIAGNOSIS — C9211 Chronic myeloid leukemia, BCR/ABL-positive, in remission: Secondary | ICD-10-CM

## 2020-05-10 DIAGNOSIS — D631 Anemia in chronic kidney disease: Secondary | ICD-10-CM | POA: Insufficient documentation

## 2020-05-10 DIAGNOSIS — R011 Cardiac murmur, unspecified: Secondary | ICD-10-CM | POA: Diagnosis not present

## 2020-05-10 DIAGNOSIS — N189 Chronic kidney disease, unspecified: Secondary | ICD-10-CM | POA: Insufficient documentation

## 2020-05-10 DIAGNOSIS — I10 Essential (primary) hypertension: Secondary | ICD-10-CM | POA: Insufficient documentation

## 2020-05-10 LAB — CBC WITH DIFFERENTIAL (CANCER CENTER ONLY)
Abs Immature Granulocytes: 0.01 10*3/uL (ref 0.00–0.07)
Basophils Absolute: 0.1 10*3/uL (ref 0.0–0.1)
Basophils Relative: 1 %
Eosinophils Absolute: 1.1 10*3/uL — ABNORMAL HIGH (ref 0.0–0.5)
Eosinophils Relative: 18 %
HCT: 32.4 % — ABNORMAL LOW (ref 39.0–52.0)
Hemoglobin: 10.8 g/dL — ABNORMAL LOW (ref 13.0–17.0)
Immature Granulocytes: 0 %
Lymphocytes Relative: 23 %
Lymphs Abs: 1.5 10*3/uL (ref 0.7–4.0)
MCH: 29.3 pg (ref 26.0–34.0)
MCHC: 33.3 g/dL (ref 30.0–36.0)
MCV: 88 fL (ref 80.0–100.0)
Monocytes Absolute: 0.7 10*3/uL (ref 0.1–1.0)
Monocytes Relative: 11 %
Neutro Abs: 3 10*3/uL (ref 1.7–7.7)
Neutrophils Relative %: 47 %
Platelet Count: 169 10*3/uL (ref 150–400)
RBC: 3.68 MIL/uL — ABNORMAL LOW (ref 4.22–5.81)
RDW: 12.4 % (ref 11.5–15.5)
WBC Count: 6.4 10*3/uL (ref 4.0–10.5)
nRBC: 0 % (ref 0.0–0.2)

## 2020-05-10 LAB — CMP (CANCER CENTER ONLY)
ALT: 57 U/L — ABNORMAL HIGH (ref 0–44)
AST: 50 U/L — ABNORMAL HIGH (ref 15–41)
Albumin: 4.1 g/dL (ref 3.5–5.0)
Alkaline Phosphatase: 89 U/L (ref 38–126)
Anion gap: 7 (ref 5–15)
BUN: 26 mg/dL — ABNORMAL HIGH (ref 6–20)
CO2: 28 mmol/L (ref 22–32)
Calcium: 9.5 mg/dL (ref 8.9–10.3)
Chloride: 104 mmol/L (ref 98–111)
Creatinine: 1.71 mg/dL — ABNORMAL HIGH (ref 0.61–1.24)
GFR, Estimated: 45 mL/min — ABNORMAL LOW (ref >60)
Glucose, Bld: 101 mg/dL — ABNORMAL HIGH (ref 70–99)
Potassium: 3.9 mmol/L (ref 3.5–5.1)
Sodium: 139 mmol/L (ref 135–145)
Total Bilirubin: 0.6 mg/dL (ref 0.3–1.2)
Total Protein: 7.3 g/dL (ref 6.5–8.1)

## 2020-05-10 NOTE — Progress Notes (Signed)
Faxed today's office note and lab results to Dr. Joylene Draft per MD request.

## 2020-05-10 NOTE — Telephone Encounter (Signed)
Scheduled appointments per 1/11 los. Spoke to patient who is aware of appointments dates and times.  

## 2020-05-10 NOTE — Progress Notes (Signed)
  [facility] OFFICE PROGRESS NOTE   Diagnosis: CML  INTERVAL HISTORY:   [name] returns as scheduled.  He continues Gleevec at a dose of 200 mg daily.  No rash, swelling, or significant diarrhea.  He feels well.  He has been evaluated by nephrology for the renal insufficiency.  Objective:  Vital signs in last 24 hours:  Blood pressure 138/76, pulse 80, temperature 97.8 F (36.6 C), temperature source Tympanic, resp. rate 18, height 5\' 8"  (1.727 m), weight 148 lb 11.2 oz (67.4 kg), SpO2 100 %.   Resp: Lungs clear bilaterally Cardio: Regular rate and rhythm GI: No hepatosplenomegaly Vascular: No leg or periorbital edema  Skin: No rash    Lab Results:  Lab Results  Component Value Date   WBC 6.4 05/10/2020   HGB 10.8 (L) 05/10/2020   HCT 32.4 (L) 05/10/2020   MCV 88.0 05/10/2020   PLT 169 05/10/2020   NEUTROABS 3.0 05/10/2020    CMP  Lab Results  Component Value Date   NA 139 05/10/2020   K 3.9 05/10/2020   CL 104 05/10/2020   CO2 28 05/10/2020   GLUCOSE 101 (H) 05/10/2020   BUN 26 (H) 05/10/2020   CREATININE 1.71 (H) 05/10/2020   CALCIUM 9.5 05/10/2020   PROT 7.3 05/10/2020   ALBUMIN 4.1 05/10/2020   AST 50 (H) 05/10/2020   ALT 57 (H) 05/10/2020   ALKPHOS 89 05/10/2020   BILITOT 0.6 05/10/2020   GFRNONAA 45 (L) 05/10/2020   GFRAA 51 (L) 11/09/2019   Medications: I have reviewed the patient's current medications.   Assessment/Plan: 1. CML diagnosed in 2004  Initial molecular remission with Gleevec  Gleevec discontinued April 2017, resumed in June 2017 after a rise in the peripheral blood BCR-ABL  Gleevec resumed and he again entered molecular remission  Peripheral blood PCR detectable 11/09/2019, not detected 02/09/2020  Gleevec decreased to 200 mg daily beginning 02/24/2020 secondary to anemia/thrombocytopenia 2. Hypertension 3. Chronic renal insufficiency 4. Chronic eosinophilia 5. Systolic heart murmur noted on exam  11/13/2018 6. Anemia secondary to Gleevec and renal insufficiency 7. Mild elevation of liver enzymes-correlates with start of rosuvastatin   Disposition: Mr. [name] appears stable.  The hemoglobin and platelet count have improved on the 200 mg dose of Gleevec.  He will continue Gleevec at the current dose.  He will return for a lab visit in 6 weeks, to include a peripheral blood PCR.  He will be scheduled for 70-month office visit.  He has chronic renal insufficiency.  The creatinine is lower compared to 3 months ago.  He is now followed by nephrology.  On There is mild elevation of the liver enzymes.  This correlates with the start of rosuvastatin in September.  We will contact Dr. Joylene Draft to consider a change in the cholesterol medication.  Betsy Coder, MD  05/10/2020  12:29 PM

## 2020-05-10 NOTE — Progress Notes (Signed)
error 

## 2020-05-16 NOTE — Telephone Encounter (Signed)
Opened in error

## 2020-06-03 ENCOUNTER — Other Ambulatory Visit: Payer: Self-pay | Admitting: Oncology

## 2020-06-07 ENCOUNTER — Ambulatory Visit: Admit: 2020-06-07 | Payer: BLUE CROSS/BLUE SHIELD | Attending: Urology | Primary: Internal Medicine

## 2020-06-07 ENCOUNTER — Encounter: Admit: 2020-06-07 | Payer: PRIVATE HEALTH INSURANCE | Attending: Urology | Primary: Internal Medicine

## 2020-06-07 DIAGNOSIS — Z125 Encounter for screening for malignant neoplasm of prostate: Secondary | ICD-10-CM

## 2020-06-07 DIAGNOSIS — M1990 Unspecified osteoarthritis, unspecified site: Secondary | ICD-10-CM

## 2020-06-07 DIAGNOSIS — N528 Other male erectile dysfunction: Secondary | ICD-10-CM

## 2020-06-07 DIAGNOSIS — Z8042 Family history of malignant neoplasm of prostate: Secondary | ICD-10-CM

## 2020-06-07 DIAGNOSIS — C8590 Non-Hodgkin lymphoma, unspecified, unspecified site: Secondary | ICD-10-CM

## 2020-06-07 DIAGNOSIS — F5221 Male erectile disorder: Secondary | ICD-10-CM

## 2020-06-07 MED ORDER — TADALAFIL 20 MG TABLET
20 mg | ORAL_TABLET | Freq: Every day | ORAL | 4 refills | Status: AC | PRN
Start: 2020-06-07 — End: 2021-06-05

## 2020-06-07 NOTE — Progress Notes
HPIRichard S Casey is a 60 y.o. male who presents with a chief complaint of ED and a family hx of prostate cancer. The patient has erectile dysfunction.  This has been an issue for several years.  He has taken Cialis with a moderate affect.  At the last visit it was recommended he take Cialis 5 milligrams daily to treat erectile dysfunction as well as urinary symptoms related to BPH. Instead he is taking Cialis prn. He has nocturia x 2. 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 Arthritis  ? NHL (non-Hodgkin's lymphoma) (HC Code) (HC CODE)  Past Surgical HistoryPast Surgical History: Procedure Laterality Date ? fractured skull    age 58 ? HERNIA REPAIR   ? KNEE ARTHROSCOPY   ? left knee cartiledge repair   ? right hip repair   ? right knee cyst removal   ? right shoulder repair   ? SHOULDER SURGERY Left  AllergiesNo Known AllergiesMedicationsOutpatient Encounter Medications as of 06/07/2020 Medication Sig Dispense Refill ? calcium-vitamin D (CALCIUM-VITAMIN D) 500 mg(1,250mg ) -200 unit per tablet Take 1 tablet by mouth 2 (two) times daily (0800, 1800)..   ? glucosamine-chondroitin 500-400 mg tablet Take 1 tablet by mouth 3 (three) times daily.   ? multivitamin (MULTIPLE VITAMIN ORAL) Take 1 tablet by mouth daily.   ? tadalafiL (CIALIS) 20 mg tablet Take 1 tablet (20 mg total) by mouth daily as needed for erectile dysfunction. 120 tablet 3 No facility-administered encounter medications on file as of 06/07/2020.  Social HistorySocial History Tobacco Use ? Smoking status: Never Smoker ? Smokeless tobacco: Never Used Vaping Use ? Vaping Use: Never used Substance Use Topics ? Alcohol use: Yes   Alcohol/week: 4.0 standard drinks   Types: 4 Cans of beer per week ? Drug use: No  Family History Family History Problem Relation Age of Onset ? Dementia Mother  ? Alcohol abuse Mother  ? Breast cancer Mother  ? Alcohol abuse Father  ? Prostate cancer Father  ? Parkinsonism Father  ? No Known Problems Brother  ? No Known Problems Brother  ? No Known Problems Daughter  ? No Known Problems Daughter  ? No Known Problems Son  ? Diabetes Son       type I Review of SystemsConstitutional: negative for weight changeCardiovascular: negative for chest painRespiratory: negative for cough or SOBAll other systems reviewed and are negative Physical ExamBP (!) 146/73  - Pulse 60  - Temp 97.7 ?F (36.5 ?C) (Temporal)  - Ht 5' 9 (1.753 m)  - Wt 77.1 kg  - SpO2 100%  - BMI 25.10 kg/m?  gen - NADDRE - normal prostate, no nodules Lab Results Component Value Date  UCOLOR yellow 06/09/2019  UGLUCOSE Negative 06/09/2019  UKETONE Negative 06/09/2019  USPECGRAVITY 1.015 06/09/2019  UPROTEIN Negative 06/09/2019  UNITRATES Negative 06/09/2019  UBLOOD Negative 06/09/2019  ULEUKOCYTES Negative 06/09/2019   Assessment:Patrick Casey is a 60 y.o. male with ED and family hx of CaPPlan:DiscussionED:Cialis 5 milligrams daily to treat ED and urinary symptoms related to BPH-he is splitting 20 milligram pillsProstate cancer screening:Family history of prostate cancer, father died from metastatic diseaseObtain PSA annuallyFollow up in 1 yearOrders Placed This Encounter Procedures ? PSA, total (screening) [facility]  [facility]) Return in about 1 year (around 06/07/2021).Patient will let us know if any new problems or symptoms arise in the interim.Pamella Pert, MDYale [phone]

## 2020-06-08 DIAGNOSIS — N400 Enlarged prostate without lower urinary tract symptoms: Secondary | ICD-10-CM

## 2020-06-21 ENCOUNTER — Other Ambulatory Visit: Payer: Self-pay

## 2020-06-21 ENCOUNTER — Inpatient Hospital Stay: Payer: BC Managed Care – PPO | Attending: Oncology

## 2020-06-21 DIAGNOSIS — C9111 Chronic lymphocytic leukemia of B-cell type in remission: Secondary | ICD-10-CM | POA: Diagnosis not present

## 2020-06-21 DIAGNOSIS — C9211 Chronic myeloid leukemia, BCR/ABL-positive, in remission: Secondary | ICD-10-CM | POA: Diagnosis not present

## 2020-06-21 LAB — CBC WITH DIFFERENTIAL (CANCER CENTER ONLY)
Abs Immature Granulocytes: 0.01 10*3/uL (ref 0.00–0.07)
Basophils Absolute: 0.1 10*3/uL (ref 0.0–0.1)
Basophils Relative: 1 %
Eosinophils Absolute: 0.9 10*3/uL — ABNORMAL HIGH (ref 0.0–0.5)
Eosinophils Relative: 16 %
HCT: 34.7 % — ABNORMAL LOW (ref 39.0–52.0)
Hemoglobin: 11.3 g/dL — ABNORMAL LOW (ref 13.0–17.0)
Immature Granulocytes: 0 %
Lymphocytes Relative: 28 %
Lymphs Abs: 1.5 10*3/uL (ref 0.7–4.0)
MCH: 29.3 pg (ref 26.0–34.0)
MCHC: 32.6 g/dL (ref 30.0–36.0)
MCV: 89.9 fL (ref 80.0–100.0)
Monocytes Absolute: 0.6 10*3/uL (ref 0.1–1.0)
Monocytes Relative: 10 %
Neutro Abs: 2.5 10*3/uL (ref 1.7–7.7)
Neutrophils Relative %: 45 %
Platelet Count: 155 10*3/uL (ref 150–400)
RBC: 3.86 MIL/uL — ABNORMAL LOW (ref 4.22–5.81)
RDW: 13.1 % (ref 11.5–15.5)
WBC Count: 5.6 10*3/uL (ref 4.0–10.5)
nRBC: 0 % (ref 0.0–0.2)

## 2020-06-21 LAB — CMP (CANCER CENTER ONLY)
ALT: 50 U/L — ABNORMAL HIGH (ref 0–44)
AST: 46 U/L — ABNORMAL HIGH (ref 15–41)
Albumin: 4.2 g/dL (ref 3.5–5.0)
Alkaline Phosphatase: 83 U/L (ref 38–126)
Anion gap: 7 (ref 5–15)
BUN: 26 mg/dL — ABNORMAL HIGH (ref 6–20)
CO2: 28 mmol/L (ref 22–32)
Calcium: 9.2 mg/dL (ref 8.9–10.3)
Chloride: 105 mmol/L (ref 98–111)
Creatinine: 1.8 mg/dL — ABNORMAL HIGH (ref 0.61–1.24)
GFR, Estimated: 43 mL/min — ABNORMAL LOW (ref >60)
Glucose, Bld: 89 mg/dL (ref 70–99)
Potassium: 4.2 mmol/L (ref 3.5–5.1)
Sodium: 140 mmol/L (ref 135–145)
Total Bilirubin: 0.5 mg/dL (ref 0.3–1.2)
Total Protein: 7.3 g/dL (ref 6.5–8.1)

## 2020-06-28 LAB — BCR/ABL

## 2020-06-29 ENCOUNTER — Telehealth: Payer: Self-pay

## 2020-06-29 NOTE — Telephone Encounter (Signed)
Called left message to relay pt most recent lab medication recommendations(continue Gleevec at same dose) and follow-up 2-3 mnths

## 2020-06-29 NOTE — Telephone Encounter (Signed)
-----   Message from Ladell Pier, MD sent at 06/28/2020  1:50 PM EST ----- Please call patient, BCR/ABL detectable at a very low level, continue Gleevec at the current dose, we will repeat in 2-3 months, follow-up as scheduled

## 2020-07-05 ENCOUNTER — Other Ambulatory Visit: Payer: Self-pay | Admitting: Oncology

## 2020-07-21 ENCOUNTER — Telehealth: Payer: Self-pay | Admitting: Oncology

## 2020-07-21 NOTE — Telephone Encounter (Signed)
R/s 4/7 appt per patient request. Called and spoke with patient. Confirmed new date and time

## 2020-08-02 ENCOUNTER — Other Ambulatory Visit: Payer: Self-pay | Admitting: Oncology

## 2020-08-04 ENCOUNTER — Other Ambulatory Visit: Payer: BC Managed Care – PPO

## 2020-08-04 ENCOUNTER — Ambulatory Visit: Payer: BC Managed Care – PPO | Admitting: Oncology

## 2020-08-09 ENCOUNTER — Inpatient Hospital Stay (HOSPITAL_BASED_OUTPATIENT_CLINIC_OR_DEPARTMENT_OTHER): Payer: BC Managed Care – PPO | Admitting: Oncology

## 2020-08-09 ENCOUNTER — Inpatient Hospital Stay: Payer: BC Managed Care – PPO | Attending: Oncology

## 2020-08-09 ENCOUNTER — Other Ambulatory Visit: Payer: Self-pay

## 2020-08-09 VITALS — BP 128/72 | HR 72 | Temp 97.7°F | Resp 18 | Ht 68.0 in | Wt 151.0 lb

## 2020-08-09 DIAGNOSIS — D649 Anemia, unspecified: Secondary | ICD-10-CM | POA: Insufficient documentation

## 2020-08-09 DIAGNOSIS — C9211 Chronic myeloid leukemia, BCR/ABL-positive, in remission: Secondary | ICD-10-CM

## 2020-08-09 DIAGNOSIS — M79672 Pain in left foot: Secondary | ICD-10-CM | POA: Diagnosis not present

## 2020-08-09 DIAGNOSIS — I10 Essential (primary) hypertension: Secondary | ICD-10-CM | POA: Insufficient documentation

## 2020-08-09 DIAGNOSIS — D721 Eosinophilia, unspecified: Secondary | ICD-10-CM | POA: Insufficient documentation

## 2020-08-09 DIAGNOSIS — C9210 Chronic myeloid leukemia, BCR/ABL-positive, not having achieved remission: Secondary | ICD-10-CM | POA: Diagnosis not present

## 2020-08-09 DIAGNOSIS — N2889 Other specified disorders of kidney and ureter: Secondary | ICD-10-CM | POA: Insufficient documentation

## 2020-08-09 DIAGNOSIS — R011 Cardiac murmur, unspecified: Secondary | ICD-10-CM | POA: Insufficient documentation

## 2020-08-09 LAB — CMP (CANCER CENTER ONLY)
ALT: 43 U/L (ref 0–44)
AST: 31 U/L (ref 15–41)
Albumin: 4.3 g/dL (ref 3.5–5.0)
Alkaline Phosphatase: 76 U/L (ref 38–126)
Anion gap: 6 (ref 5–15)
BUN: 27 mg/dL — ABNORMAL HIGH (ref 6–20)
CO2: 28 mmol/L (ref 22–32)
Calcium: 9.3 mg/dL (ref 8.9–10.3)
Chloride: 106 mmol/L (ref 98–111)
Creatinine: 1.7 mg/dL — ABNORMAL HIGH (ref 0.61–1.24)
GFR, Estimated: 46 mL/min — ABNORMAL LOW (ref >60)
Glucose, Bld: 90 mg/dL (ref 70–99)
Potassium: 4.1 mmol/L (ref 3.5–5.1)
Sodium: 140 mmol/L (ref 135–145)
Total Bilirubin: 0.5 mg/dL (ref 0.3–1.2)
Total Protein: 6.9 g/dL (ref 6.5–8.1)

## 2020-08-09 LAB — CBC WITH DIFFERENTIAL (CANCER CENTER ONLY)
Abs Immature Granulocytes: 0.01 10*3/uL (ref 0.00–0.07)
Basophils Absolute: 0.1 10*3/uL (ref 0.0–0.1)
Basophils Relative: 1 %
Eosinophils Absolute: 1.3 10*3/uL — ABNORMAL HIGH (ref 0.0–0.5)
Eosinophils Relative: 17 %
HCT: 36.5 % — ABNORMAL LOW (ref 39.0–52.0)
Hemoglobin: 11.8 g/dL — ABNORMAL LOW (ref 13.0–17.0)
Immature Granulocytes: 0 %
Lymphocytes Relative: 25 %
Lymphs Abs: 1.8 10*3/uL (ref 0.7–4.0)
MCH: 28.8 pg (ref 26.0–34.0)
MCHC: 32.3 g/dL (ref 30.0–36.0)
MCV: 89 fL (ref 80.0–100.0)
Monocytes Absolute: 0.8 10*3/uL (ref 0.1–1.0)
Monocytes Relative: 11 %
Neutro Abs: 3.4 10*3/uL (ref 1.7–7.7)
Neutrophils Relative %: 46 %
Platelet Count: 168 10*3/uL (ref 150–400)
RBC: 4.1 MIL/uL — ABNORMAL LOW (ref 4.22–5.81)
RDW: 13.1 % (ref 11.5–15.5)
WBC Count: 7.3 10*3/uL (ref 4.0–10.5)
nRBC: 0 % (ref 0.0–0.2)

## 2020-08-09 NOTE — Progress Notes (Signed)
  [facility] OFFICE PROGRESS NOTE   Diagnosis: CML  INTERVAL HISTORY:   Patrick Nash returns as scheduled.  He continues [place].  No rash, swelling, or diarrhea.  He reports discomfort in the left shoulder for the past several weeks.  He had been doing push-ups.  He also has discomfort at the dorsum of the left foot.  No known trauma.  No swelling.  Objective:  Vital signs in last 24 hours:  Blood pressure 128/72, pulse 72, temperature 97.7 F (36.5 C), temperature source Tympanic, resp. rate 18, height 5\' 8"  (1.727 m), weight 151 lb (68.5 kg), SpO2 100 %.    Resp: Lungs clear bilaterally Cardio: Regular rate and rhythm GI: No hepatosplenomegaly Vascular: No leg edema  Skin: No rash Musculoskeletal: Examination of the left foot is unremarkable.  Mild discomfort with abduction at the left shoulder.   Lab Results:  Lab Results  Component Value Date   WBC 7.3 08/09/2020   HGB 11.8 (L) 08/09/2020   HCT 36.5 (L) 08/09/2020   MCV 89.0 08/09/2020   PLT 168 08/09/2020   NEUTROABS 3.4 08/09/2020    CMP  Lab Results  Component Value Date   NA 140 08/09/2020   K 4.1 08/09/2020   CL 106 08/09/2020   CO2 28 08/09/2020   GLUCOSE 90 08/09/2020   BUN 27 (H) 08/09/2020   CREATININE 1.70 (H) 08/09/2020   CALCIUM 9.3 08/09/2020   PROT 6.9 08/09/2020   ALBUMIN 4.3 08/09/2020   AST 31 08/09/2020   ALT 43 08/09/2020   ALKPHOS 76 08/09/2020   BILITOT 0.5 08/09/2020   GFRNONAA 46 (L) 08/09/2020   GFRAA 51 (L) 11/09/2019   Peripheral blood PCR 06/21/2020-0.0081% Medications: I have reviewed the patient's current medications.   Assessment/Plan: 1. CML diagnosed in 2004  Initial molecular remission with Gleevec  Gleevec discontinued April 2017, resumed in June 2017 after a rise in the peripheral blood BCR-ABL  Gleevec resumed and he again entered molecular remission  Peripheral blood PCR detectable 11/09/2019, not detected 02/09/2020  Gleevec decreased to 200  mg daily beginning 02/24/2020 secondary to anemia/thrombocytopenia 2. Hypertension 3. Chronic renal insufficiency 4. Chronic eosinophilia 5. Systolic heart murmur noted on exam 11/13/2018 6. Anemia secondary to Gleevec and renal insufficiency 7. Mild elevation of liver enzymes-correlates with start of rosuvastatin    Disposition: Patrick Nash appears stable.  The liver enzymes are now normal.  He has not changed the cholesterol medication.  He remains in hematologic remission and continues to have a major molecular response to the CML.  He will continue Gleevec at the current dose.  He will return for lab visit in 6 weeks and an office visit in 12 weeks.  We will repeat the peripheral blood PCR when he is here in 6 weeks.  I suspect the left shoulder and left foot pain are related to a benign musculoskeletal condition.  He will call for persistence of the pain.  Betsy Coder, MD  08/09/2020  10:02 AM

## 2020-08-27 ENCOUNTER — Inpatient Hospital Stay: Admit: 2020-08-27 | Discharge: 2020-08-27 | Payer: BLUE CROSS/BLUE SHIELD | Primary: Internal Medicine

## 2020-08-27 DIAGNOSIS — Z8042 Family history of malignant neoplasm of prostate: Secondary | ICD-10-CM

## 2020-08-27 DIAGNOSIS — Z125 Encounter for screening for malignant neoplasm of prostate: Secondary | ICD-10-CM

## 2020-08-29 LAB — PSA, TOTAL (SCREENING) (BH GH L LMW YH): BKR PROSTATE SPECIFIC ANTIGEN, SCREENING: 0.44 ng/mL (ref 0.000–3.900)

## 2020-08-29 NOTE — Other
PSA stable

## 2020-09-02 ENCOUNTER — Other Ambulatory Visit: Payer: Self-pay | Admitting: Oncology

## 2020-09-05 ENCOUNTER — Other Ambulatory Visit: Payer: Self-pay | Admitting: *Deleted

## 2020-09-05 NOTE — Progress Notes (Signed)
Received faxed refill request for Gleevec.

## 2020-09-06 ENCOUNTER — Inpatient Hospital Stay: Admit: 2020-09-06 | Discharge: 2020-09-06 | Payer: BLUE CROSS/BLUE SHIELD | Primary: Internal Medicine

## 2020-09-06 ENCOUNTER — Ambulatory Visit: Admit: 2020-09-06 | Payer: PRIVATE HEALTH INSURANCE | Attending: Internal Medicine | Primary: Internal Medicine

## 2020-09-06 DIAGNOSIS — N400 Enlarged prostate without lower urinary tract symptoms: Secondary | ICD-10-CM

## 2020-09-06 DIAGNOSIS — Z0000 Encounter for general adult medical examination without abnormal findings: Secondary | ICD-10-CM

## 2020-09-06 DIAGNOSIS — R7989 Other specified abnormal findings of blood chemistry: Secondary | ICD-10-CM

## 2020-09-06 DIAGNOSIS — Z8572 Personal history of non-Hodgkin lymphomas: Secondary | ICD-10-CM

## 2020-09-06 MED ORDER — TADALAFIL 5 MG TABLET
5 | Freq: Every day | ORAL | 4.00 refills | 30.00000 days | Status: AC
Start: 2020-09-06 — End: 2021-06-05

## 2020-09-20 ENCOUNTER — Inpatient Hospital Stay: Payer: BC Managed Care – PPO | Attending: Oncology

## 2020-09-20 ENCOUNTER — Other Ambulatory Visit: Payer: Self-pay

## 2020-09-20 DIAGNOSIS — C9211 Chronic myeloid leukemia, BCR/ABL-positive, in remission: Secondary | ICD-10-CM | POA: Insufficient documentation

## 2020-09-20 LAB — CBC WITH DIFFERENTIAL (CANCER CENTER ONLY)
Abs Immature Granulocytes: 0.01 10*3/uL (ref 0.00–0.07)
Basophils Absolute: 0.1 10*3/uL (ref 0.0–0.1)
Basophils Relative: 1 %
Eosinophils Absolute: 1.1 10*3/uL — ABNORMAL HIGH (ref 0.0–0.5)
Eosinophils Relative: 18 %
HCT: 35.5 % — ABNORMAL LOW (ref 39.0–52.0)
Hemoglobin: 11.7 g/dL — ABNORMAL LOW (ref 13.0–17.0)
Immature Granulocytes: 0 %
Lymphocytes Relative: 25 %
Lymphs Abs: 1.6 10*3/uL (ref 0.7–4.0)
MCH: 29.4 pg (ref 26.0–34.0)
MCHC: 33 g/dL (ref 30.0–36.0)
MCV: 89.2 fL (ref 80.0–100.0)
Monocytes Absolute: 0.7 10*3/uL (ref 0.1–1.0)
Monocytes Relative: 12 %
Neutro Abs: 2.8 10*3/uL (ref 1.7–7.7)
Neutrophils Relative %: 44 %
Platelet Count: 158 10*3/uL (ref 150–400)
RBC: 3.98 MIL/uL — ABNORMAL LOW (ref 4.22–5.81)
RDW: 12.7 % (ref 11.5–15.5)
WBC Count: 6.3 10*3/uL (ref 4.0–10.5)
nRBC: 0 % (ref 0.0–0.2)

## 2020-09-20 LAB — CMP (CANCER CENTER ONLY)
ALT: 31 U/L (ref 0–44)
AST: 31 U/L (ref 15–41)
Albumin: 4.2 g/dL (ref 3.5–5.0)
Alkaline Phosphatase: 69 U/L (ref 38–126)
Anion gap: 8 (ref 5–15)
BUN: 27 mg/dL — ABNORMAL HIGH (ref 6–20)
CO2: 28 mmol/L (ref 22–32)
Calcium: 8.9 mg/dL (ref 8.9–10.3)
Chloride: 103 mmol/L (ref 98–111)
Creatinine: 1.51 mg/dL — ABNORMAL HIGH (ref 0.61–1.24)
GFR, Estimated: 53 mL/min — ABNORMAL LOW (ref >60)
Glucose, Bld: 87 mg/dL (ref 70–99)
Potassium: 3.7 mmol/L (ref 3.5–5.1)
Sodium: 139 mmol/L (ref 135–145)
Total Bilirubin: 0.5 mg/dL (ref 0.3–1.2)
Total Protein: 7.1 g/dL (ref 6.5–8.1)

## 2020-09-21 ENCOUNTER — Emergency Department: Admit: 2020-09-21 | Payer: BLUE CROSS/BLUE SHIELD | Primary: Internal Medicine

## 2020-09-21 ENCOUNTER — Inpatient Hospital Stay: Admit: 2020-09-21 | Discharge: 2020-09-21 | Payer: BLUE CROSS/BLUE SHIELD | Attending: Emergency Medicine

## 2020-09-21 DIAGNOSIS — Z79899 Other long term (current) drug therapy: Secondary | ICD-10-CM

## 2020-09-21 DIAGNOSIS — R109 Unspecified abdominal pain: Principal | ICD-10-CM

## 2020-09-21 DIAGNOSIS — M791 Myalgia, unspecified site: Secondary | ICD-10-CM

## 2020-09-21 DIAGNOSIS — N529 Male erectile dysfunction, unspecified: Secondary | ICD-10-CM

## 2020-09-21 LAB — CBC WITH AUTO DIFFERENTIAL
BKR WAM ABSOLUTE IMMATURE GRANULOCYTES.: 0.03 x 1000/ÂµL (ref 0.00–0.30)
BKR WAM ABSOLUTE LYMPHOCYTE COUNT.: 1.53 x 1000/ÂµL (ref 0.60–3.70)
BKR WAM ABSOLUTE NRBC (2 DEC): 0 x 1000/ÂµL (ref 0.00–1.00)
BKR WAM ANALYZER ANC: 4.2 x 1000/ÂµL (ref 2.00–7.60)
BKR WAM BASOPHIL ABSOLUTE COUNT.: 0.04 x 1000/ÂµL (ref 0.00–1.00)
BKR WAM BASOPHILS: 0.6 % (ref 0.0–1.4)
BKR WAM EOSINOPHIL ABSOLUTE COUNT.: 0.26 x 1000/ÂµL (ref 0.00–1.00)
BKR WAM EOSINOPHILS: 3.8 % (ref 0.0–5.0)
BKR WAM HEMATOCRIT (2 DEC): 39.2 % (ref 38.50–50.00)
BKR WAM HEMOGLOBIN: 13.1 g/dL — ABNORMAL LOW (ref 13.2–17.1)
BKR WAM IMMATURE GRANULOCYTES: 0.4 % (ref 0.0–1.0)
BKR WAM LYMPHOCYTES: 22.6 % (ref 17.0–50.0)
BKR WAM MCH (PG): 31.5 pg (ref 27.0–33.0)
BKR WAM MCHC: 33.4 g/dL (ref 31.0–36.0)
BKR WAM MCV: 94.2 fL (ref 80.0–100.0)
BKR WAM MONOCYTE ABSOLUTE COUNT.: 0.71 x 1000/ÂµL (ref 0.00–1.00)
BKR WAM MONOCYTES: 10.5 % (ref 4.0–12.0)
BKR WAM MPV: 9.9 fL (ref 8.0–12.0)
BKR WAM NEUTROPHILS: 62.1 % (ref 39.0–72.0)
BKR WAM NUCLEATED RED BLOOD CELLS: 0 % (ref 0.0–1.0)
BKR WAM PLATELETS: 279 x1000/ÂµL (ref 150–420)
BKR WAM RDW-CV: 13.5 % (ref 11.0–15.0)
BKR WAM RED BLOOD CELL COUNT.: 4.16 M/ÂµL (ref 4.00–6.00)
BKR WAM WHITE BLOOD CELL COUNT: 6.8 x1000/ÂµL (ref 4.0–11.0)

## 2020-09-21 LAB — BASIC METABOLIC PANEL
BKR ANION GAP: 9 (ref 7–17)
BKR BLOOD UREA NITROGEN: 19 mg/dL (ref 6–20)
BKR BUN / CREAT RATIO: 15.7 (ref 8.0–23.0)
BKR CALCIUM: 9.6 mg/dL (ref 8.8–10.2)
BKR CHLORIDE: 100 mmol/L (ref 98–107)
BKR CO2: 28 mmol/L (ref 20–30)
BKR CREATININE: 1.21 mg/dL (ref 0.40–1.30)
BKR EGFR (AFR AMER): >60 mL/min/{1.73_m2} (ref >60)
BKR EGFR (NON AFRICAN AMERICAN): >60 mL/min/{1.73_m2} (ref >60)
BKR GLUCOSE: 100 mg/dL (ref 70–100)
BKR POTASSIUM: 4.1 mmol/L (ref 3.3–5.3)
BKR SODIUM: 137 mmol/L (ref 136–144)

## 2020-09-21 LAB — URINALYSIS WITH CULTURE REFLEX      (BH LMW YH)
BKR BILIRUBIN, UA: NEGATIVE
BKR BLOOD, UA: NEGATIVE
BKR GLUCOSE, UA: NEGATIVE
BKR KETONES, UA: NEGATIVE
BKR LEUKOCYTE ESTERASE, UA: NEGATIVE
BKR NITRITE, UA: NEGATIVE
BKR PH, UA: 7 (ref 5.5–7.5)
BKR PROTEIN, UA: NEGATIVE
BKR SPECIFIC GRAVITY, UA: 1.01 (ref 1.005–1.030)
BKR UROBILINOGEN, UA (MG/DL): <2 mg/dL (ref <=2.0)

## 2020-09-21 LAB — UA REFLEX CULTURE

## 2020-09-21 MED ORDER — SODIUM CHLORIDE 0.9 % LARGE VOLUME SYRINGE FOR AUTOINJECTOR
Freq: Once | INTRAVENOUS | Status: CP | PRN
Start: 2020-09-21 — End: ?
  Administered 2020-09-21: 07:00:00 via INTRAVENOUS

## 2020-09-21 MED ORDER — IBUPROFEN 600 MG TABLET
600 mg | Freq: Once | ORAL | Status: CP
Start: 2020-09-21 — End: ?
  Administered 2020-09-21: 06:00:00 600 mg via ORAL

## 2020-09-21 MED ORDER — SODIUM CHLORIDE 0.9 % BOLUS (NEW BAG)
0.9 % | INTRAVENOUS | Status: CP
Start: 2020-09-21 — End: ?
  Administered 2020-09-21: 07:00:00 0.9 mL/h via INTRAVENOUS

## 2020-09-21 MED ORDER — IOHEXOL 350 MG IODINE/ML INTRAVENOUS SOLUTION
350 mg iodine/mL | Freq: Once | INTRAVENOUS | Status: CP | PRN
Start: 2020-09-21 — End: ?
  Administered 2020-09-21: 07:00:00 350 mL via INTRAVENOUS

## 2020-09-21 NOTE — ED Notes
1:57 AM Pt to ED through triage with Left flank pain that progressed from intermittent to constant, beginning Tuesday am. Pt reports PCP is monitoring kidney function. Pt denies injury. Pt denies NVD and testicular pain. Pt provoided urine sample on presentation.

## 2020-09-21 NOTE — ED Notes
3:07 AM Pt reports improvement in pain. Pt checks pain response with AROM to back.

## 2020-09-21 NOTE — ED Provider Notes
HistoryChief Complaint Patient presents with ? Flank Pain  60 year old male here with left-sided flank pain since yesterday morning.  He describes sharp pain in his left flank, nonradiating.  Worse with movement, improved with rest.  No other associated symptoms.  Has had this pain in the past, has never sought medical attention.  The pain has been more persistent than previous episodes.  Denies dysuria, hematuria, trauma, fevers, vomiting, diarrhea.The history is provided by the patient. Flank PainThis is a new problem. The current episode started 12 to 24 hours ago. The problem occurs constantly. The problem has not changed since onset.Pertinent negatives include no chest pain, no abdominal pain, no headaches and no shortness of breath. Nothing relieves the symptoms. He has tried nothing for the symptoms.  Past Medical History: Diagnosis Date ? Arthritis  ? NHL (non-Hodgkin's lymphoma) (HC Code)  Past Surgical History: Procedure Laterality Date ? fractured skull    age 24 ? HERNIA REPAIR   ? KNEE ARTHROSCOPY   ? left knee cartiledge repair   ? right hip repair   ? right knee cyst removal   ? right shoulder repair   ? SHOULDER SURGERY Left  Family History Problem Relation Age of Onset ? Dementia Mother  ? Alcohol abuse Mother  ? Breast cancer Mother  ? Alcohol abuse Father  ? Prostate cancer Father  ? Parkinsonism Father  ? No Known Problems Brother  ? No Known Problems Brother  ? No Known Problems Daughter  ? No Known Problems Daughter  ? No Known Problems Son  ? Diabetes Son       type I Social History Socioeconomic History ? Marital status: Married Tobacco Use ? Smoking status: Never Smoker ? Smokeless tobacco: Never Used Vaping Use ? Vaping Use: Never used Substance and Sexual Activity ? Alcohol use: Yes   Alcohol/week: 4.0 standard drinks   Types: 4 Cans of beer per week ? Drug use: No ? Sexual activity: Yes ED Other Social History ? E-cigarette status Never User  ? E-Cigarette Use Never User  E-cigarette/Vaping Substances E-cigarette/Vaping Devices Review of Systems Respiratory: Negative for shortness of breath.  Cardiovascular: Negative for chest pain. Gastrointestinal: Negative for abdominal pain. Genitourinary: Positive for flank pain. Negative for decreased urine volume, difficulty urinating, dysuria, frequency, hematuria and urgency. Musculoskeletal: Positive for back pain. Neurological: Negative for headaches. All other systems reviewed and are negative. Physical ExamED Triage Vitals [09/21/20 0145]BP: (!) 143/77Pulse: (!) 48Pulse from  O2 sat: n/aResp: 15Temp: 97 ?F (36.1 ?C)Temp src: TemporalSpO2: 100 % BP (!) 149/74  - Pulse (!) 55  - Temp 97.9 ?F (36.6 ?C) (Oral)  - Resp 18  - Ht 5' 9 (1.753 m)  - Wt 74.8 kg  - SpO2 100%  - BMI 24.37 kg/m? Physical ExamVitals and nursing note reviewed. Constitutional:     General: He is not in acute distress.   Appearance: Normal appearance. HENT:    Head: Normocephalic and atraumatic. Eyes:    General: No scleral icterus.   Extraocular Movements: Extraocular movements intact. Cardiovascular:    Rate and Rhythm: Normal rate. Pulmonary:    Effort: Pulmonary effort is normal. No respiratory distress. Abdominal:    General: Abdomen is flat. There is no distension.    Tenderness: There is no abdominal tenderness. There is no right CVA tenderness, left CVA tenderness or rebound. Musculoskeletal:       General: No swelling or tenderness.    Right lower leg: No edema.    Left lower leg: No edema.  Skin:   Coloration: Skin is not jaundiced.    Findings: No rash. Neurological:    General: No focal deficit present.    Mental Status: He is alert and oriented to person, place, and time.    Cranial Nerves: No cranial nerve deficit. ProceduresProcedures ED COURSEReviewed previous: previous chartInterpreted by ED Provider: pulse oximetry and labsPatient Reevaluation: 60 year old male here with left-sided flank pain since yesterday morning.  Constant pain, worse with movement, improved with rest. No reported trauma, dysuria, hematuria, fevers, vomiting, diarrhea.  On exam the patient has normal vital signs.  He appears comfortable.  He has no reproducible CVA tenderness.  Abdomen is soft and benign.  Labs, urine reassuring.Imaging negative for acute pathology. Likely muscular. Improved with NSAIDs. Suitable for dc, PMD follow up.Renie Ora MD, MPHDictation device and software may have been used during this encounter, please excuse any spelling, word substitution, or punctuation errors. Clinical Impressions as of 09/21/20 0349 Muscular pain  ED DispositionDischarge Renie Ora, MD05/25/22 1610

## 2020-09-21 NOTE — Discharge Instructions
Your labs and [place] scan are reassuring.  You do not have any signs of infection or kidney stone.  Your pain may be related to muscular strain.  Take ibuprofen 600mg  every 8 hours OR naprosyn 1-2 tabs every 12 hours with food for your symptoms. You can alternate this with tylenol 500mg  every 8 hours as needed.Follow up with your primary care doctor in the next few days. Return to the nearest Emergency Department if you have worsening pain, fevers, trouble urinating, vomiting, or any other concerning symptoms.

## 2020-10-04 LAB — BCR/ABL

## 2020-10-06 ENCOUNTER — Other Ambulatory Visit: Payer: Self-pay | Admitting: Oncology

## 2020-10-12 ENCOUNTER — Encounter: Payer: Self-pay | Admitting: Oncology

## 2020-10-14 ENCOUNTER — Telehealth: Payer: Self-pay

## 2020-10-14 NOTE — Telephone Encounter (Signed)
-----   Message from Ladell Pier, MD sent at 10/13/2020  4:49 PM EDT ----- Please call patient, the peripheral blood PCR looks good, continue Gleevec at the current dose, follow-up as scheduled

## 2020-10-14 NOTE — Telephone Encounter (Signed)
V/M message relaying Dr Benay Spice message. Informed Pt to return call with problems or concerns.

## 2020-10-17 DIAGNOSIS — I129 Hypertensive chronic kidney disease with stage 1 through stage 4 chronic kidney disease, or unspecified chronic kidney disease: Secondary | ICD-10-CM | POA: Diagnosis not present

## 2020-10-17 DIAGNOSIS — N183 Chronic kidney disease, stage 3 unspecified: Secondary | ICD-10-CM | POA: Diagnosis not present

## 2020-10-17 DIAGNOSIS — D631 Anemia in chronic kidney disease: Secondary | ICD-10-CM | POA: Diagnosis not present

## 2020-10-17 DIAGNOSIS — E785 Hyperlipidemia, unspecified: Secondary | ICD-10-CM | POA: Diagnosis not present

## 2020-10-21 DIAGNOSIS — D649 Anemia, unspecified: Secondary | ICD-10-CM | POA: Diagnosis not present

## 2020-10-21 DIAGNOSIS — E785 Hyperlipidemia, unspecified: Secondary | ICD-10-CM | POA: Diagnosis not present

## 2020-10-21 DIAGNOSIS — R7301 Impaired fasting glucose: Secondary | ICD-10-CM | POA: Diagnosis not present

## 2020-10-21 DIAGNOSIS — Z125 Encounter for screening for malignant neoplasm of prostate: Secondary | ICD-10-CM | POA: Diagnosis not present

## 2020-10-27 DIAGNOSIS — Z1212 Encounter for screening for malignant neoplasm of rectum: Secondary | ICD-10-CM | POA: Diagnosis not present

## 2020-10-27 DIAGNOSIS — R82998 Other abnormal findings in urine: Secondary | ICD-10-CM | POA: Diagnosis not present

## 2020-10-27 DIAGNOSIS — I10 Essential (primary) hypertension: Secondary | ICD-10-CM | POA: Diagnosis not present

## 2020-10-28 DIAGNOSIS — C9590 Leukemia, unspecified not having achieved remission: Secondary | ICD-10-CM | POA: Diagnosis not present

## 2020-10-28 DIAGNOSIS — Z1331 Encounter for screening for depression: Secondary | ICD-10-CM | POA: Diagnosis not present

## 2020-10-28 DIAGNOSIS — Z0000 Encounter for general adult medical examination without abnormal findings: Secondary | ICD-10-CM | POA: Diagnosis not present

## 2020-10-28 DIAGNOSIS — R197 Diarrhea, unspecified: Secondary | ICD-10-CM | POA: Diagnosis not present

## 2020-10-28 DIAGNOSIS — Z23 Encounter for immunization: Secondary | ICD-10-CM | POA: Diagnosis not present

## 2020-11-01 ENCOUNTER — Other Ambulatory Visit: Payer: Self-pay

## 2020-11-01 ENCOUNTER — Inpatient Hospital Stay: Payer: BC Managed Care – PPO | Attending: Oncology | Admitting: Oncology

## 2020-11-01 ENCOUNTER — Inpatient Hospital Stay: Payer: BC Managed Care – PPO

## 2020-11-01 VITALS — BP 140/76 | HR 80 | Temp 97.8°F | Resp 20 | Ht 68.0 in | Wt 151.0 lb

## 2020-11-01 DIAGNOSIS — C9210 Chronic myeloid leukemia, BCR/ABL-positive, not having achieved remission: Secondary | ICD-10-CM | POA: Insufficient documentation

## 2020-11-01 DIAGNOSIS — R748 Abnormal levels of other serum enzymes: Secondary | ICD-10-CM | POA: Insufficient documentation

## 2020-11-01 DIAGNOSIS — I10 Essential (primary) hypertension: Secondary | ICD-10-CM | POA: Diagnosis not present

## 2020-11-01 DIAGNOSIS — N2889 Other specified disorders of kidney and ureter: Secondary | ICD-10-CM | POA: Diagnosis not present

## 2020-11-01 DIAGNOSIS — R42 Dizziness and giddiness: Secondary | ICD-10-CM | POA: Insufficient documentation

## 2020-11-01 DIAGNOSIS — C9211 Chronic myeloid leukemia, BCR/ABL-positive, in remission: Secondary | ICD-10-CM

## 2020-11-01 DIAGNOSIS — D721 Eosinophilia, unspecified: Secondary | ICD-10-CM | POA: Diagnosis not present

## 2020-11-01 DIAGNOSIS — D631 Anemia in chronic kidney disease: Secondary | ICD-10-CM | POA: Diagnosis not present

## 2020-11-01 DIAGNOSIS — R011 Cardiac murmur, unspecified: Secondary | ICD-10-CM | POA: Insufficient documentation

## 2020-11-01 NOTE — Progress Notes (Signed)
  Patrick Nash OFFICE PROGRESS NOTE   Diagnosis: CML  INTERVAL HISTORY:  Patrick Nash returns as scheduled.  He continues Gleevec at a dose of 200 mg daily.  No rash or consistent diarrhea.  He has occasional "dizziness ".  This improved partially when he held amlodipine per advice from the nephrology service.  Objective:  Vital signs in last 24 hours:  Blood pressure 140/76, pulse 80, temperature 97.8 F (36.6 C), temperature source Oral, resp. rate 20, height 5\' 8"  (1.727 m), weight 151 lb (68.5 kg), SpO2 100 %.    Resp: Lungs clear bilaterally Cardio: Regular rate and rhythm GI: No hepatosplenomegaly Vascular: No leg edema  Skin: No rash   Lab Results: [facility] 10/21/2020  White count 5.89, hemoglobin 11.5, platelets 143,000, ANC 3.0  CMP : Creatinine 1.6, BUN 18   Medications: I have reviewed the patient's current medications.   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  He remains in hematologic remission from CML.  He is tolerating the Center well.  The peripheral blood PCR on 09/20/2020 was very low.  He will continue Gleevec at the current dose.  He will return for lab visit on 12/05/2020.  He will be scheduled for a repeat PCR on 01/23/2021.  He will return  for an office visit on 01/30/2021.  He will follow-up with Dr. Joylene Draft for persistent dizziness  Betsy Coder, MD  11/01/2020  11:01 AM

## 2020-11-04 ENCOUNTER — Other Ambulatory Visit: Payer: Self-pay | Admitting: Oncology

## 2020-11-07 ENCOUNTER — Other Ambulatory Visit: Payer: Self-pay | Admitting: *Deleted

## 2020-11-07 NOTE — Telephone Encounter (Signed)
Received refill request for Gleevec from [facility].

## 2020-12-05 ENCOUNTER — Other Ambulatory Visit: Payer: Self-pay

## 2020-12-05 ENCOUNTER — Inpatient Hospital Stay: Payer: BC Managed Care – PPO | Attending: Oncology

## 2020-12-05 DIAGNOSIS — C9211 Chronic myeloid leukemia, BCR/ABL-positive, in remission: Secondary | ICD-10-CM | POA: Insufficient documentation

## 2020-12-05 LAB — CMP (CANCER CENTER ONLY)
ALT: 38 U/L (ref 0–44)
AST: 36 U/L (ref 15–41)
Albumin: 4.2 g/dL (ref 3.5–5.0)
Alkaline Phosphatase: 71 U/L (ref 38–126)
Anion gap: 6 (ref 5–15)
BUN: 24 mg/dL — ABNORMAL HIGH (ref 8–23)
CO2: 30 mmol/L (ref 22–32)
Calcium: 9.4 mg/dL (ref 8.9–10.3)
Chloride: 103 mmol/L (ref 98–111)
Creatinine: 1.6 mg/dL — ABNORMAL HIGH (ref 0.61–1.24)
GFR, Estimated: 49 mL/min — ABNORMAL LOW (ref >60)
Glucose, Bld: 87 mg/dL (ref 70–99)
Potassium: 4 mmol/L (ref 3.5–5.1)
Sodium: 139 mmol/L (ref 135–145)
Total Bilirubin: 0.7 mg/dL (ref 0.3–1.2)
Total Protein: 7.1 g/dL (ref 6.5–8.1)

## 2020-12-05 LAB — CBC WITH DIFFERENTIAL (CANCER CENTER ONLY)
Abs Immature Granulocytes: 0.01 10*3/uL (ref 0.00–0.07)
Basophils Absolute: 0.1 10*3/uL (ref 0.0–0.1)
Basophils Relative: 1 %
Eosinophils Absolute: 1.1 10*3/uL — ABNORMAL HIGH (ref 0.0–0.5)
Eosinophils Relative: 15 %
HCT: 35.5 % — ABNORMAL LOW (ref 39.0–52.0)
Hemoglobin: 11.8 g/dL — ABNORMAL LOW (ref 13.0–17.0)
Immature Granulocytes: 0 %
Lymphocytes Relative: 25 %
Lymphs Abs: 1.8 10*3/uL (ref 0.7–4.0)
MCH: 29.6 pg (ref 26.0–34.0)
MCHC: 33.2 g/dL (ref 30.0–36.0)
MCV: 89.2 fL (ref 80.0–100.0)
Monocytes Absolute: 0.8 10*3/uL (ref 0.1–1.0)
Monocytes Relative: 11 %
Neutro Abs: 3.4 10*3/uL (ref 1.7–7.7)
Neutrophils Relative %: 48 %
Platelet Count: 178 10*3/uL (ref 150–400)
RBC: 3.98 MIL/uL — ABNORMAL LOW (ref 4.22–5.81)
RDW: 12.7 % (ref 11.5–15.5)
WBC Count: 7.1 10*3/uL (ref 4.0–10.5)
nRBC: 0 % (ref 0.0–0.2)

## 2020-12-08 ENCOUNTER — Other Ambulatory Visit: Payer: Self-pay | Admitting: Oncology

## 2020-12-12 DIAGNOSIS — M25512 Pain in left shoulder: Secondary | ICD-10-CM | POA: Diagnosis not present

## 2021-01-23 ENCOUNTER — Inpatient Hospital Stay: Payer: BC Managed Care – PPO | Attending: Oncology

## 2021-01-23 ENCOUNTER — Other Ambulatory Visit: Payer: Self-pay

## 2021-01-23 DIAGNOSIS — C9211 Chronic myeloid leukemia, BCR/ABL-positive, in remission: Secondary | ICD-10-CM | POA: Insufficient documentation

## 2021-01-23 LAB — CBC WITH DIFFERENTIAL (CANCER CENTER ONLY)
Abs Immature Granulocytes: 0.01 10*3/uL (ref 0.00–0.07)
Basophils Absolute: 0.1 10*3/uL (ref 0.0–0.1)
Basophils Relative: 1 %
Eosinophils Absolute: 1.1 10*3/uL — ABNORMAL HIGH (ref 0.0–0.5)
Eosinophils Relative: 17 %
HCT: 35.2 % — ABNORMAL LOW (ref 39.0–52.0)
Hemoglobin: 11.6 g/dL — ABNORMAL LOW (ref 13.0–17.0)
Immature Granulocytes: 0 %
Lymphocytes Relative: 21 %
Lymphs Abs: 1.4 10*3/uL (ref 0.7–4.0)
MCH: 29.6 pg (ref 26.0–34.0)
MCHC: 33 g/dL (ref 30.0–36.0)
MCV: 89.8 fL (ref 80.0–100.0)
Monocytes Absolute: 0.8 10*3/uL (ref 0.1–1.0)
Monocytes Relative: 12 %
Neutro Abs: 3.1 10*3/uL (ref 1.7–7.7)
Neutrophils Relative %: 49 %
Platelet Count: 160 10*3/uL (ref 150–400)
RBC: 3.92 MIL/uL — ABNORMAL LOW (ref 4.22–5.81)
RDW: 12.7 % (ref 11.5–15.5)
WBC Count: 6.4 10*3/uL (ref 4.0–10.5)
nRBC: 0 % (ref 0.0–0.2)

## 2021-01-23 LAB — CMP (CANCER CENTER ONLY)
ALT: 39 U/L (ref 0–44)
AST: 34 U/L (ref 15–41)
Albumin: 4.2 g/dL (ref 3.5–5.0)
Alkaline Phosphatase: 75 U/L (ref 38–126)
Anion gap: 7 (ref 5–15)
BUN: 24 mg/dL — ABNORMAL HIGH (ref 8–23)
CO2: 29 mmol/L (ref 22–32)
Calcium: 9.3 mg/dL (ref 8.9–10.3)
Chloride: 103 mmol/L (ref 98–111)
Creatinine: 1.57 mg/dL — ABNORMAL HIGH (ref 0.61–1.24)
GFR, Estimated: 50 mL/min — ABNORMAL LOW (ref >60)
Glucose, Bld: 97 mg/dL (ref 70–99)
Potassium: 4.4 mmol/L (ref 3.5–5.1)
Sodium: 139 mmol/L (ref 135–145)
Total Bilirubin: 0.5 mg/dL (ref 0.3–1.2)
Total Protein: 6.7 g/dL (ref 6.5–8.1)

## 2021-01-30 ENCOUNTER — Other Ambulatory Visit: Payer: Self-pay

## 2021-01-30 ENCOUNTER — Inpatient Hospital Stay: Payer: BC Managed Care – PPO | Attending: Oncology | Admitting: Oncology

## 2021-01-30 VITALS — BP 145/75 | HR 76 | Temp 98.7°F | Resp 18 | Ht 68.0 in | Wt 156.8 lb

## 2021-01-30 DIAGNOSIS — D721 Eosinophilia, unspecified: Secondary | ICD-10-CM | POA: Diagnosis not present

## 2021-01-30 DIAGNOSIS — I10 Essential (primary) hypertension: Secondary | ICD-10-CM | POA: Insufficient documentation

## 2021-01-30 DIAGNOSIS — N189 Chronic kidney disease, unspecified: Secondary | ICD-10-CM | POA: Diagnosis not present

## 2021-01-30 DIAGNOSIS — D649 Anemia, unspecified: Secondary | ICD-10-CM | POA: Insufficient documentation

## 2021-01-30 DIAGNOSIS — R011 Cardiac murmur, unspecified: Secondary | ICD-10-CM | POA: Diagnosis not present

## 2021-01-30 DIAGNOSIS — C9211 Chronic myeloid leukemia, BCR/ABL-positive, in remission: Secondary | ICD-10-CM

## 2021-01-30 DIAGNOSIS — R948 Abnormal results of function studies of other organs and systems: Secondary | ICD-10-CM | POA: Diagnosis not present

## 2021-01-30 DIAGNOSIS — I129 Hypertensive chronic kidney disease with stage 1 through stage 4 chronic kidney disease, or unspecified chronic kidney disease: Secondary | ICD-10-CM | POA: Insufficient documentation

## 2021-01-30 NOTE — Progress Notes (Signed)
  [place] OFFICE PROGRESS NOTE   Diagnosis: CML  INTERVAL HISTORY:   [name] returns as scheduled.  He continues Gleevec at a dose of 200 mg daily.  He has discomfort at the left shoulder.  He was referred to orthopedics and has started an exercise program.  No rash, swelling, or diarrhea.  Objective:  Vital signs in last 24 hours:  Blood pressure (!) 145/75, pulse 76, temperature 98.7 F (37.1 C), temperature source Oral, resp. rate 18, height 5\' 8"  (1.727 m), weight 156 lb 12.8 oz (71.1 kg), SpO2 100 %.    HEENT: No thrush or ulcers Resp: Lungs clear bilaterally Cardio: Regular rate and rhythm GI: No hepatosplenomegaly Vascular: No leg edema  Skin: No rash  Lab Results:  Lab Results  Component Value Date   WBC 6.4 01/23/2021   HGB 11.6 (L) 01/23/2021   HCT 35.2 (L) 01/23/2021   MCV 89.8 01/23/2021   PLT 160 01/23/2021   NEUTROABS 3.1 01/23/2021    CMP  Lab Results  Component Value Date   NA 139 01/23/2021   K 4.4 01/23/2021   CL 103 01/23/2021   CO2 29 01/23/2021   GLUCOSE 97 01/23/2021   BUN 24 (H) 01/23/2021   CREATININE 1.57 (H) 01/23/2021   CALCIUM 9.3 01/23/2021   PROT 6.7 01/23/2021   ALBUMIN 4.2 01/23/2021   AST 34 01/23/2021   ALT 39 01/23/2021   ALKPHOS 75 01/23/2021   BILITOT 0.5 01/23/2021   GFRNONAA 50 (L) 01/23/2021   GFRAA 51 (L) 11/09/2019     Medications: I have reviewed the patient's current medications.   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 appears stable.  He will continue Gleevec at the current dose.  We will follow-up on the peripheral blood PCR from today.  He will be scheduled for lab visit in 3 months and an office visit in 6 months.  He will obtain an influenza vaccine and COVID-19 booster.  Betsy Coder, MD  01/30/2021  1:04 PM

## 2021-02-01 DIAGNOSIS — C9210 Chronic myeloid leukemia, BCR/ABL-positive, not having achieved remission: Secondary | ICD-10-CM | POA: Diagnosis not present

## 2021-02-01 DIAGNOSIS — Z23 Encounter for immunization: Secondary | ICD-10-CM | POA: Diagnosis not present

## 2021-02-01 DIAGNOSIS — D631 Anemia in chronic kidney disease: Secondary | ICD-10-CM | POA: Diagnosis not present

## 2021-02-01 DIAGNOSIS — I129 Hypertensive chronic kidney disease with stage 1 through stage 4 chronic kidney disease, or unspecified chronic kidney disease: Secondary | ICD-10-CM | POA: Diagnosis not present

## 2021-02-01 DIAGNOSIS — N183 Chronic kidney disease, stage 3 unspecified: Secondary | ICD-10-CM | POA: Diagnosis not present

## 2021-02-02 LAB — BCR/ABL

## 2021-02-03 ENCOUNTER — Telehealth: Payer: Self-pay

## 2021-02-03 NOTE — Telephone Encounter (Signed)
Called pt made aware of most recent lab results provider instruction to continues [place] at current dose and f/u as scheduled no questions or concerns voiced nothing further call ended

## 2021-02-03 NOTE — Telephone Encounter (Signed)
-----   Message from Ladell Pier, MD sent at 02/02/2021  8:43 PM EDT ----- Please call patient, pcr is negative for the CML rearrangement,continue gleevec at current dose,f/u as scheduled

## 2021-03-09 ENCOUNTER — Other Ambulatory Visit: Payer: Self-pay | Admitting: Oncology

## 2021-04-06 ENCOUNTER — Other Ambulatory Visit: Payer: Self-pay | Admitting: Oncology

## 2021-05-02 ENCOUNTER — Inpatient Hospital Stay: Payer: BC Managed Care – PPO | Attending: Oncology

## 2021-05-02 ENCOUNTER — Other Ambulatory Visit: Payer: Self-pay

## 2021-05-02 DIAGNOSIS — C9211 Chronic myeloid leukemia, BCR/ABL-positive, in remission: Secondary | ICD-10-CM | POA: Diagnosis not present

## 2021-05-02 LAB — CBC WITH DIFFERENTIAL (CANCER CENTER ONLY)
Abs Immature Granulocytes: 0 10*3/uL (ref 0.00–0.07)
Basophils Absolute: 0.1 10*3/uL (ref 0.0–0.1)
Basophils Relative: 1 %
Eosinophils Absolute: 1.1 10*3/uL — ABNORMAL HIGH (ref 0.0–0.5)
Eosinophils Relative: 17 %
HCT: 36.5 % — ABNORMAL LOW (ref 39.0–52.0)
Hemoglobin: 12 g/dL — ABNORMAL LOW (ref 13.0–17.0)
Immature Granulocytes: 0 %
Lymphocytes Relative: 24 %
Lymphs Abs: 1.5 10*3/uL (ref 0.7–4.0)
MCH: 29.5 pg (ref 26.0–34.0)
MCHC: 32.9 g/dL (ref 30.0–36.0)
MCV: 89.7 fL (ref 80.0–100.0)
Monocytes Absolute: 0.7 10*3/uL (ref 0.1–1.0)
Monocytes Relative: 11 %
Neutro Abs: 3 10*3/uL (ref 1.7–7.7)
Neutrophils Relative %: 47 %
Platelet Count: 166 10*3/uL (ref 150–400)
RBC: 4.07 MIL/uL — ABNORMAL LOW (ref 4.22–5.81)
RDW: 12.8 % (ref 11.5–15.5)
WBC Count: 6.4 10*3/uL (ref 4.0–10.5)
nRBC: 0 % (ref 0.0–0.2)

## 2021-05-02 LAB — CMP (CANCER CENTER ONLY)
ALT: 32 U/L (ref 0–44)
AST: 31 U/L (ref 15–41)
Albumin: 4.3 g/dL (ref 3.5–5.0)
Alkaline Phosphatase: 78 U/L (ref 38–126)
Anion gap: 7 (ref 5–15)
BUN: 25 mg/dL — ABNORMAL HIGH (ref 8–23)
CO2: 29 mmol/L (ref 22–32)
Calcium: 9.1 mg/dL (ref 8.9–10.3)
Chloride: 103 mmol/L (ref 98–111)
Creatinine: 1.47 mg/dL — ABNORMAL HIGH (ref 0.61–1.24)
GFR, Estimated: 54 mL/min — ABNORMAL LOW (ref >60)
Glucose, Bld: 96 mg/dL (ref 70–99)
Potassium: 4.1 mmol/L (ref 3.5–5.1)
Sodium: 139 mmol/L (ref 135–145)
Total Bilirubin: 0.5 mg/dL (ref 0.3–1.2)
Total Protein: 7.1 g/dL (ref 6.5–8.1)

## 2021-05-04 ENCOUNTER — Other Ambulatory Visit: Payer: Self-pay | Admitting: Oncology

## 2021-06-01 ENCOUNTER — Encounter: Admit: 2021-06-01 | Payer: PRIVATE HEALTH INSURANCE | Primary: Internal Medicine

## 2021-06-05 ENCOUNTER — Encounter: Admit: 2021-06-05 | Payer: PRIVATE HEALTH INSURANCE | Attending: Urology | Primary: Internal Medicine

## 2021-06-05 ENCOUNTER — Ambulatory Visit: Admit: 2021-06-05 | Payer: BLUE CROSS/BLUE SHIELD | Attending: Urology | Primary: Internal Medicine

## 2021-06-05 DIAGNOSIS — N529 Male erectile dysfunction, unspecified: Secondary | ICD-10-CM

## 2021-06-05 DIAGNOSIS — N400 Enlarged prostate without lower urinary tract symptoms: Secondary | ICD-10-CM

## 2021-06-05 DIAGNOSIS — C8590 Non-Hodgkin lymphoma, unspecified, unspecified site: Secondary | ICD-10-CM

## 2021-06-05 DIAGNOSIS — M1990 Unspecified osteoarthritis, unspecified site: Secondary | ICD-10-CM

## 2021-06-05 MED ORDER — TADALAFIL 5 MG TABLET
5 mg | ORAL_TABLET | Freq: Every day | ORAL | 4 refills | Status: AC
Start: 2021-06-05 — End: 2021-08-23

## 2021-06-05 MED ORDER — TADALAFIL 20 MG TABLET
20 mg | ORAL_TABLET | Freq: Every day | ORAL | 4 refills | Status: AC | PRN
Start: 2021-06-05 — End: 2021-08-23

## 2021-06-05 NOTE — Progress Notes
HPI    Patrick Casey is a 61 y.o. male who presents with a chief complaint of ED, BPH and a family hx of prostate cancer. The patient has erectile dysfunction.  This has been an issue for several years.  He has taken Cialis 20 mg daily prn with a moderate affect.  He has been taking Cialis 5 milligrams daily to treat erectile dysfunction as well as urinary symptoms related to BPH.  He feels some improvement while taking daily Cialis- less nocturia- down to 2x from 3x.  Urinary flow remains moderate.On 09/21/2020 he presented to the ED with left sided flank pain worsened with movement. He has had prior similar symptoms, did not require medical intervention.  [place] with no acute abnormality in the abdomen or pelvis. Slight wall thickening of the urinary bladder. Pain likely muscular in origin, improved with NSAIDs.  Symptoms resolved, no recurrence of flank pain. 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 Worse with movement and improved with rest. No dysuria, hematuria, trauma, fevers, vomiting, or diarrhea. Benign examinationCOMPARISON: [place] September 14, 2005TECHNIQUE: [place] images of the abdomen and pelvis were obtained from the diaphragms to the pubic symphysis after the administration of Tc intravenous Casey (Omnipaque 350). ??FINDINGS:LUNG BASES: Stable scattered cystic changes are noted throughout the lung bases. ?LIVER: Scattered hepatic cysts. Otherwise unremarkable.GALLBLADDER: Unremarkable.SPLEEN: Unremarkable.PANCREAS: Unremarkable.ADRENALS: Unremarkable.KIDNEYS: Unremarkable.?BOWEL: There is moderate pancolonic stool burden.APPENDIX: The appendix is normal.PERITONEUM: No intraperitoneal free fluid. No intraperitoneal free air. ?LYMPH NODES: Unremarkable.VESSELS: There is mild calcified atherosclerotic disease without aneurysmal dilatation.?URINARY BLADDER: Slight wall thickening of the urinary bladder.PELVIS: Unremarkable.?BONES & SOFT TISSUE: Unremarkable.??IMPRESSION:Moderate pancolonic stool burden with no acute abnormality in the abdomen or pelvis. Stable chronic pulmonary cystic changes.?Subtle thickening of the urinary bladder. This could be secondary to slight underdistention, although should be correlated with urinalysis.?Recent PSA Results:PSA RESULTS Latest Ref Rng & Units 08/27/2020 06/17/2019 08/08/2017 11/02/2015 PSA 0.000 - 3.900 ng/mL 0.440 0.719 0.461 0.6 Past Medical HistoryPast Medical History: Diagnosis Date ? Arthritis  ? NHL (non-Hodgkin's lymphoma) (HC Code)  Past Surgical HistoryPast Surgical History: Procedure Laterality Date ? fractured skull    age 81 ? HERNIA REPAIR   ? KNEE ARTHROSCOPY   ? left knee cartiledge repair   ? right hip repair   ? right knee cyst removal   ? right shoulder repair   ? SHOULDER SURGERY Left  AllergiesNo Known AllergiesMedicationsOutpatient Encounter Medications as of 06/05/2021 Medication Sig Dispense Refill ? calcium carbonate 1250 mg (500 mg calcium) - Vitamin D3 200 Unit tablet Take 1 tablet by mouth 2 (two) times daily (0800, 1800).   ? glucosamine-chondroitin 500-400 mg tablet Take 1 tablet by mouth 3 (three) times daily.   ? multivitamin (MULTIPLE VITAMIN ORAL) Take 1 tablet by mouth daily.   ? [DISCONTINUED] tadalafiL (CIALIS) 20 mg tablet Take 1 tablet (20 mg total) by mouth daily as needed for erectile dysfunction. 120 tablet 3 ? [DISCONTINUED] tadalafiL (CIALIS) 5 mg tablet Take 1 tablet (5 mg total) by mouth daily.   ? tadalafiL (CIALIS) 20 mg tablet Take 1 tablet (20 mg total) by mouth daily as needed for erectile dysfunction. 90 tablet 3 ? tadalafiL (CIALIS) 5 mg tablet Take 1 tablet (5 mg total) by mouth daily. 90 tablet 3 No facility-administered encounter medications on file  as of 06/05/2021.  Social HistorySocial History Tobacco Use ? Smoking status: Never ? Smokeless tobacco: Never Vaping Use ? Vaping Use: Never used Substance Use Topics ? Alcohol use: Yes   Alcohol/week: 4.0 standard drinks   Types: 4 Cans of beer per week ? Drug use: No  Family History Family History Problem Relation Age of Onset ? Dementia Mother  ? Alcohol abuse Mother  ? Breast cancer Mother  ? Alcohol abuse Father  ? Prostate cancer Father  ? Parkinsonism Father  ? No Known Problems Brother  ? No Known Problems Brother  ? No Known Problems Daughter  ? No Known Problems Daughter  ? No Known Problems Son  ? Diabetes Son       type I Review of SystemsConstitutional: negative for weight changeCardiovascular: negative for chest painRespiratory: negative for cough or SOBAll other systems reviewed and are negative Physical ExamTemp 98 ?F (36.7 ?C) (No-touch scanner)  - Ht 5' 8.5 (1.74 m)  - Wt 77.1 kg  - BMI 25.47 kg/m?  Constitutional:  Well developed, no acute distress  EENT: Sclera normal, normal nares and mucosaRespiratory:  Normal respiratory effortCardiovascular: RRR,  No evidence of extremity swellingSkin: warm and dryMusculoskeletal: Gait appears normal  Neurological: well oriented to time, place, and person  Prostate: approximately 30 cc, no tumors, normal consistency, nontender, symmetric  Lab Results Component Value Date  UCOLOR yellow 06/09/2019  UGLUCOSE Negative 06/09/2019  UKETONE Negative 06/09/2019  USPECGRAVITY 1.015 06/09/2019  UPROTEIN Negative 06/09/2019  UNITRATES Negative 06/09/2019  UBLOOD Negative 06/09/2019  ULEUKOCYTES Negative 06/09/2019   Assessment:[name] is a 61 y.o. male with BPH and ED, family hx of CaPPlan:DiscussionBPH:Continue Cialis 5 milligrams daily to treat both BPH symptoms and erectile dysfunction ZO:XWRUEA 20 mg daily prn - take 1 to 2 hours prior to sexual activity. Written Rx given (skip 5 mg dose of Cialis that day)Prostate cancer screening:Family history of prostate cancer, father died from metastatic diseaseRepeat DRE/PSA annually - next PSA April 2023Follow up in 1 yearOrders Placed This Encounter Procedures ? PSA, total (screening) ([facility] L [facility]) Return in about 1 year (around 06/05/2022).Patient will let us know if any new problems or symptoms arise in the interim.Pamella Pert, MDYale [phone] for Pamella Pert, MD by Ellan Lambert, medical scribe June 05, 2021  The documentation recorded by the scribe accurately reflects the services I personally performed and the decisions made by me. I reviewed and confirmed all material entered and/or pre-charted by the scribe.

## 2021-06-06 DIAGNOSIS — Z125 Encounter for screening for malignant neoplasm of prostate: Secondary | ICD-10-CM

## 2021-06-06 DIAGNOSIS — N528 Other male erectile dysfunction: Secondary | ICD-10-CM

## 2021-06-06 DIAGNOSIS — Z8042 Family history of malignant neoplasm of prostate: Secondary | ICD-10-CM

## 2021-06-07 ENCOUNTER — Encounter: Admit: 2021-06-07 | Payer: PRIVATE HEALTH INSURANCE | Primary: Internal Medicine

## 2021-06-14 ENCOUNTER — Encounter: Admit: 2021-06-14 | Payer: PRIVATE HEALTH INSURANCE | Primary: Internal Medicine

## 2021-06-28 ENCOUNTER — Other Ambulatory Visit (HOSPITAL_COMMUNITY): Payer: Self-pay

## 2021-07-03 ENCOUNTER — Telehealth: Admit: 2021-07-03 | Payer: PRIVATE HEALTH INSURANCE | Primary: Internal Medicine

## 2021-07-04 ENCOUNTER — Encounter: Admit: 2021-07-04 | Payer: BLUE CROSS/BLUE SHIELD | Attending: Internal Medicine | Primary: Internal Medicine

## 2021-07-04 ENCOUNTER — Encounter: Admit: 2021-07-04 | Payer: PRIVATE HEALTH INSURANCE | Attending: Internal Medicine | Primary: Internal Medicine

## 2021-07-04 ENCOUNTER — Ambulatory Visit: Admit: 2021-07-04 | Payer: BLUE CROSS/BLUE SHIELD | Attending: Internal Medicine | Primary: Internal Medicine

## 2021-07-04 ENCOUNTER — Inpatient Hospital Stay: Admit: 2021-07-04 | Discharge: 2021-07-04 | Payer: BLUE CROSS/BLUE SHIELD | Primary: Internal Medicine

## 2021-07-04 DIAGNOSIS — N4889 Other specified disorders of penis: Secondary | ICD-10-CM

## 2021-07-04 DIAGNOSIS — Z8572 Personal history of non-Hodgkin lymphomas: Secondary | ICD-10-CM

## 2021-07-17 ENCOUNTER — Inpatient Hospital Stay: Payer: BC Managed Care – PPO | Attending: Oncology

## 2021-07-17 ENCOUNTER — Other Ambulatory Visit: Payer: Self-pay

## 2021-07-17 DIAGNOSIS — D631 Anemia in chronic kidney disease: Secondary | ICD-10-CM | POA: Diagnosis not present

## 2021-07-17 DIAGNOSIS — I129 Hypertensive chronic kidney disease with stage 1 through stage 4 chronic kidney disease, or unspecified chronic kidney disease: Secondary | ICD-10-CM | POA: Insufficient documentation

## 2021-07-17 DIAGNOSIS — C9211 Chronic myeloid leukemia, BCR/ABL-positive, in remission: Secondary | ICD-10-CM | POA: Diagnosis not present

## 2021-07-17 DIAGNOSIS — C9210 Chronic myeloid leukemia, BCR/ABL-positive, not having achieved remission: Secondary | ICD-10-CM | POA: Diagnosis not present

## 2021-07-17 DIAGNOSIS — N189 Chronic kidney disease, unspecified: Secondary | ICD-10-CM | POA: Insufficient documentation

## 2021-07-17 LAB — CMP (CANCER CENTER ONLY)
ALT: 53 U/L — ABNORMAL HIGH (ref 0–44)
AST: 41 U/L (ref 15–41)
Albumin: 4.2 g/dL (ref 3.5–5.0)
Alkaline Phosphatase: 70 U/L (ref 38–126)
Anion gap: 6 (ref 5–15)
BUN: 24 mg/dL — ABNORMAL HIGH (ref 8–23)
CO2: 31 mmol/L (ref 22–32)
Calcium: 9.5 mg/dL (ref 8.9–10.3)
Chloride: 104 mmol/L (ref 98–111)
Creatinine: 1.55 mg/dL — ABNORMAL HIGH (ref 0.61–1.24)
GFR, Estimated: 51 mL/min — ABNORMAL LOW (ref >60)
Glucose, Bld: 96 mg/dL (ref 70–99)
Potassium: 3.9 mmol/L (ref 3.5–5.1)
Sodium: 141 mmol/L (ref 135–145)
Total Bilirubin: 0.7 mg/dL (ref 0.3–1.2)
Total Protein: 6.9 g/dL (ref 6.5–8.1)

## 2021-07-17 LAB — CBC WITH DIFFERENTIAL (CANCER CENTER ONLY)
Abs Immature Granulocytes: 0.01 10*3/uL (ref 0.00–0.07)
Basophils Absolute: 0.1 10*3/uL (ref 0.0–0.1)
Basophils Relative: 1 %
Eosinophils Absolute: 1.3 10*3/uL — ABNORMAL HIGH (ref 0.0–0.5)
Eosinophils Relative: 18 %
HCT: 36.2 % — ABNORMAL LOW (ref 39.0–52.0)
Hemoglobin: 11.7 g/dL — ABNORMAL LOW (ref 13.0–17.0)
Immature Granulocytes: 0 %
Lymphocytes Relative: 25 %
Lymphs Abs: 1.8 10*3/uL (ref 0.7–4.0)
MCH: 29 pg (ref 26.0–34.0)
MCHC: 32.3 g/dL (ref 30.0–36.0)
MCV: 89.6 fL (ref 80.0–100.0)
Monocytes Absolute: 0.7 10*3/uL (ref 0.1–1.0)
Monocytes Relative: 10 %
Neutro Abs: 3.4 10*3/uL (ref 1.7–7.7)
Neutrophils Relative %: 46 %
Platelet Count: 174 10*3/uL (ref 150–400)
RBC: 4.04 MIL/uL — ABNORMAL LOW (ref 4.22–5.81)
RDW: 12.8 % (ref 11.5–15.5)
WBC Count: 7.2 10*3/uL (ref 4.0–10.5)
nRBC: 0 % (ref 0.0–0.2)

## 2021-07-24 LAB — BCR/ABL

## 2021-07-26 ENCOUNTER — Telehealth: Payer: Self-pay

## 2021-07-26 ENCOUNTER — Encounter: Admit: 2021-07-26 | Payer: PRIVATE HEALTH INSURANCE | Primary: Internal Medicine

## 2021-07-26 NOTE — Telephone Encounter (Signed)
Pt verbalized understanding.

## 2021-07-26 NOTE — Telephone Encounter (Signed)
-----   Message from Ladell Pier, MD sent at 07/26/2021  3:38 PM EDT ----- ?Please call patient, peripheral blood PCR shows the CML translocation is barely detectable, continue Gleevec, follow-up as scheduled ? ?

## 2021-08-01 ENCOUNTER — Inpatient Hospital Stay: Payer: BC Managed Care – PPO | Attending: Oncology | Admitting: Oncology

## 2021-08-01 VITALS — BP 136/76 | HR 81 | Temp 97.8°F | Resp 18 | Ht 68.0 in | Wt 156.8 lb

## 2021-08-01 DIAGNOSIS — D721 Eosinophilia, unspecified: Secondary | ICD-10-CM | POA: Diagnosis not present

## 2021-08-01 DIAGNOSIS — R948 Abnormal results of function studies of other organs and systems: Secondary | ICD-10-CM | POA: Insufficient documentation

## 2021-08-01 DIAGNOSIS — C9211 Chronic myeloid leukemia, BCR/ABL-positive, in remission: Secondary | ICD-10-CM | POA: Diagnosis not present

## 2021-08-01 DIAGNOSIS — D631 Anemia in chronic kidney disease: Secondary | ICD-10-CM | POA: Diagnosis not present

## 2021-08-01 DIAGNOSIS — N189 Chronic kidney disease, unspecified: Secondary | ICD-10-CM | POA: Diagnosis not present

## 2021-08-01 DIAGNOSIS — Z923 Personal history of irradiation: Secondary | ICD-10-CM | POA: Diagnosis not present

## 2021-08-01 DIAGNOSIS — I129 Hypertensive chronic kidney disease with stage 1 through stage 4 chronic kidney disease, or unspecified chronic kidney disease: Secondary | ICD-10-CM | POA: Diagnosis not present

## 2021-08-01 NOTE — Progress Notes (Signed)
?  Patrick Nash ?OFFICE PROGRESS NOTE ? ? ?Diagnosis: CML ? ?INTERVAL HISTORY:  ? ?Patrick Nash as scheduled.  He continues [place].  No rash, diarrhea, or swelling.  He feels well. ? ?Objective: ? ?Vital signs in last 24 hours: ? ?Blood pressure 136/76, pulse 81, temperature 97.8 ?F (36.6 ?C), temperature source Oral, resp. rate 18, height '5\' 8"'$  (1.727 m), weight 156 lb 12.8 oz (71.1 kg), SpO2 100 %. ?  ? ?HEENT: No thrush or ulcers ?Resp: Lungs clear bilaterally ?Cardio: Regular rate and rhythm ?GI: No hepatosplenomegaly ?Vascular: No leg edema ?Skin: No rash ? ?Lab Results: ? ?Lab Results  ?Component Value Date  ? WBC 7.2 07/17/2021  ? HGB 11.7 (L) 07/17/2021  ? HCT 36.2 (L) 07/17/2021  ? MCV 89.6 07/17/2021  ? PLT 174 07/17/2021  ? NEUTROABS 3.4 07/17/2021  ? ? ?CMP  ?Lab Results  ?Component Value Date  ? NA 141 07/17/2021  ? K 3.9 07/17/2021  ? CL 104 07/17/2021  ? CO2 31 07/17/2021  ? GLUCOSE 96 07/17/2021  ? BUN 24 (H) 07/17/2021  ? CREATININE 1.55 (H) 07/17/2021  ? CALCIUM 9.5 07/17/2021  ? PROT 6.9 07/17/2021  ? ALBUMIN 4.2 07/17/2021  ? AST 41 07/17/2021  ? ALT 53 (H) 07/17/2021  ? ALKPHOS 70 07/17/2021  ? BILITOT 0.7 07/17/2021  ? GFRNONAA 51 (L) 07/17/2021  ? GFRAA 51 (L) 11/09/2019  ? ?Medications: I have reviewed the patient's current medications. ? ? ?Assessment/Plan: ?CML diagnosed in 2004 ?Initial molecular remission with Gleevec ?Gleevec discontinued April 2017, resumed in June 2017 after a rise in the peripheral blood BCR-ABL ?Gleevec resumed and he again entered molecular remission ?Peripheral blood PCR detectable 11/09/2019, not detected 02/09/2020 ?Gleevec decreased to 200 mg daily beginning 02/24/2020 secondary to anemia/thrombocytopenia ?Peripheral blood PCR 09/20/2020-detected, below limit of reporting ?Peripheral blood FISH 01/23/2021-negative ?Peripheral blood PCR 07/17/2021-0.01% ?Hypertension ?Chronic renal insufficiency ?Chronic eosinophilia ?Systolic heart murmur noted on  exam 11/13/2018 ?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 ? ? ? ?Disposition: ?Patrick. Nash appears stable.  The peripheral blood PCR from 07/17/2021 revealed the BCR: ABL translocation at a very low level.  He remains in a major cytogenetic remission.  He will continue Gleevec at current dose.  He will return for a PCR in 3 months.  He will be scheduled for an office visit in 6 months.  We will increase the Gleevec dose if there is a progressive rise in the PCR. ? ?Betsy Coder, MD ? ?08/01/2021  ?11:49 AM ? ? ?

## 2021-08-03 ENCOUNTER — Other Ambulatory Visit: Payer: Self-pay | Admitting: Oncology

## 2021-08-15 ENCOUNTER — Ambulatory Visit: Admit: 2021-08-15 | Payer: BLUE CROSS/BLUE SHIELD | Attending: Medical | Primary: Internal Medicine

## 2021-08-23 ENCOUNTER — Encounter: Admit: 2021-08-23 | Payer: PRIVATE HEALTH INSURANCE | Attending: Internal Medicine | Primary: Internal Medicine

## 2021-08-23 ENCOUNTER — Telehealth: Admit: 2021-08-23 | Payer: PRIVATE HEALTH INSURANCE | Attending: Urology | Primary: Internal Medicine

## 2021-08-23 MED ORDER — TADALAFIL 5 MG TABLET
5 | ORAL_TABLET | Freq: Every day | ORAL | 4 refills | 30.00000 days | Status: AC
Start: 2021-08-23 — End: 2021-08-24

## 2021-08-23 MED ORDER — TADALAFIL 20 MG TABLET
20 | ORAL_TABLET | Freq: Every day | ORAL | 4 refills | 30.00000 days | Status: AC | PRN
Start: 2021-08-23 — End: 2021-08-24

## 2021-08-23 NOTE — Telephone Encounter
[facility]:  REFILL MESSAGETime Of Call:   4:10 PMCaller:  Zacchary RosenCaller's relationship to patient:  n/a  Calling from [employer], agency, etc.):  n/aReason for call:   RefillHas patient been seen in the last 12 months:  yes Name of medication:  tadalafiL (CIALIS) 5 mg tabletPharmacy name:   [id number] - [place], [place] - [address], [place] [phone] Pharmacy number:  [phone] patient contact pharmacy and was told there are no refills remaining (if no, ask caller to call pharmacy and request electronic refill request): Patient shard that he lost his prior prescription refill and is asking if he can pick up the medication from the [facility] office, as he does not want to pick it up from the pharmacy. Patient shared that the prescription cost is too high from the pharmacy and would like to know if he can pick it up in the office or be provided with alteratives of medication retrieval. Please follow up with patient for more details.How many days of medication does patient have left (route as high priority if 3 business days or less):  7 daysBest telephone number for callback:   [phone] Best time to return call:   Anytime Permission to leave message:  yesDamon [facility] Ref[facility] Specialist

## 2021-08-23 NOTE — Telephone Encounter
Patrick Casey contacted. Message from care center totally misconstrued.  Patient requesting paper copy of cialis 20 mg prescription and cialis 5mg  prescription to pick up from [place] office tomorrow to shop around. He misplaced the original copies he was given. Routing to RN who will be in [place] office tomorrow as Lorain Childes. She is aware.

## 2021-08-24 ENCOUNTER — Encounter: Admit: 2021-08-24 | Payer: PRIVATE HEALTH INSURANCE | Attending: Urology | Primary: Internal Medicine

## 2021-08-24 ENCOUNTER — Telehealth: Admit: 2021-08-24 | Payer: PRIVATE HEALTH INSURANCE | Attending: Urology | Primary: Internal Medicine

## 2021-08-24 DIAGNOSIS — N528 Other male erectile dysfunction: Secondary | ICD-10-CM

## 2021-08-24 MED ORDER — TADALAFIL 20 MG TABLET
20 mg | ORAL_TABLET | Freq: Every day | ORAL | 4 refills | Status: AC | PRN
Start: 2021-08-24 — End: 2021-08-24

## 2021-08-24 MED ORDER — TADALAFIL 5 MG TABLET
5 mg | ORAL_TABLET | Freq: Every day | ORAL | 4 refills | Status: AC
Start: 2021-08-24 — End: 2021-08-24

## 2021-08-24 MED ORDER — TADALAFIL 20 MG TABLET
20 mg | ORAL_TABLET | Freq: Every day | ORAL | 4 refills | Status: AC | PRN
Start: 2021-08-24 — End: ?

## 2021-08-24 MED ORDER — TADALAFIL 5 MG TABLET
5 mg | ORAL_TABLET | Freq: Every day | ORAL | 4 refills | Status: AC
Start: 2021-08-24 — End: ?

## 2021-08-24 NOTE — Telephone Encounter
Called and spoke to [employer]. Informed pt printed prescriptions are ready for pick up in the [place] office per pt request. No further questions.

## 2021-08-24 NOTE — Telephone Encounter
Anthem/ Tadalafil 5 mg tab (bph)PA Case: [id number] Status: ApprovedCoverage Starts on: 08/24/2021 -Coverage Ends on: 08/24/2022. Pt would like to come into the office to pick up a hard copy Rx for Tadalafil 5 mg and 20 mg.Pt will used [facility] discount card.Requesting a call once the scripts is ready for pick up.

## 2021-08-24 NOTE — Telephone Encounter
Patient is calling to follow up on prescriptions. He will like to know when can he come to the [place] office to pick them up. Please call [place] back at [phone]

## 2021-09-07 ENCOUNTER — Encounter: Admit: 2021-09-07 | Payer: PRIVATE HEALTH INSURANCE | Attending: Internal Medicine | Primary: Internal Medicine

## 2021-09-07 ENCOUNTER — Ambulatory Visit: Admit: 2021-09-07 | Payer: BLUE CROSS/BLUE SHIELD | Attending: Internal Medicine | Primary: Internal Medicine

## 2021-09-30 ENCOUNTER — Inpatient Hospital Stay: Admit: 2021-09-30 | Discharge: 2021-09-30 | Payer: BLUE CROSS/BLUE SHIELD | Primary: Internal Medicine

## 2021-09-30 DIAGNOSIS — R7989 Other specified abnormal findings of blood chemistry: Secondary | ICD-10-CM

## 2021-09-30 DIAGNOSIS — Z0000 Encounter for general adult medical examination without abnormal findings: Secondary | ICD-10-CM

## 2021-09-30 DIAGNOSIS — Z8572 Personal history of non-Hodgkin lymphomas: Secondary | ICD-10-CM

## 2021-09-30 LAB — COMPREHENSIVE METABOLIC PANEL
BKR A/G RATIO: 1.9 x 1000/??L (ref 1.0–2.2)
BKR ALANINE AMINOTRANSFERASE (ALT): 36 U/L (ref 9–59)
BKR ALBUMIN: 4.4 g/dL (ref 3.6–4.9)
BKR ALKALINE PHOSPHATASE: 77 U/L (ref 9–122)
BKR ASPARTATE AMINOTRANSFERASE (AST): 53 U/L — ABNORMAL HIGH (ref 10–35)
BKR AST/ALT RATIO: 1.5
BKR BILIRUBIN TOTAL: 0.7 mg/dL (ref <=1.2)
BKR BLOOD UREA NITROGEN: 22 mg/dL (ref 8–23)
BKR BUN / CREAT RATIO: 18.5 % (ref 8.0–23.0)
BKR CALCIUM: 9.3 mg/dL (ref 8.8–10.2)
BKR CHLORIDE: 103 mmol/L (ref 98–107)
BKR CO2: 27 mmol/L (ref 20–30)
BKR CREATININE: 1.19 mg/dL (ref 0.40–1.30)
BKR EGFR, CREATININE (CKD-EPI 2021): >60 mL/min/{1.73_m2} (ref >=60)
BKR GLOBULIN: 2.3 g/dL (ref 2.3–3.5)
BKR GLUCOSE: 94 mg/dL (ref 70–100)
BKR POTASSIUM: 4.2 mmol/L (ref 3.3–5.3)
BKR PROTEIN TOTAL: 6.7 g/dL — ABNORMAL HIGH (ref 6.6–8.7)
BKR SODIUM: 142 mmol/L (ref 136–144)
BKR WAM ANALYZER ANC: 53 U/L — ABNORMAL HIGH (ref 10–35)

## 2021-09-30 LAB — CBC WITH AUTO DIFFERENTIAL
BKR WAM ABSOLUTE IMMATURE GRANULOCYTES.: 0.02 x 1000/??L — ABNORMAL LOW (ref 0.00–0.30)
BKR WAM ABSOLUTE LYMPHOCYTE COUNT.: 1.33 x 1000/??L (ref 0.60–3.70)
BKR WAM ABSOLUTE NRBC (2 DEC): 0 x 1000/ÂµL (ref 0.00–1.00)
BKR WAM BASOPHIL ABSOLUTE COUNT.: 0.07 x 1000/ÂµL (ref 0.00–1.00)
BKR WAM BASOPHILS: 1.3 % (ref 0.0–1.4)
BKR WAM EOSINOPHIL ABSOLUTE COUNT.: 0.12 x 1000/ÂµL (ref 0.00–1.00)
BKR WAM EOSINOPHILS: 2.3 % (ref 0.0–5.0)
BKR WAM HEMATOCRIT (2 DEC): 43.2 % (ref 38.50–50.00)
BKR WAM HEMOGLOBIN: 14.1 g/dL (ref 13.2–17.1)
BKR WAM IMMATURE GRANULOCYTES: 0.4 % (ref 0.0–1.0)
BKR WAM LYMPHOCYTES: 25.1 % (ref 17.0–50.0)
BKR WAM MCH (PG): 31.8 pg (ref 27.0–33.0)
BKR WAM MCHC: 32.6 g/dL (ref 31.0–36.0)
BKR WAM MCV: 97.3 fL (ref 80.0–100.0)
BKR WAM MONOCYTE ABSOLUTE COUNT.: 0.66 x 1000/ÂµL (ref 0.00–1.00)
BKR WAM MONOCYTES: 12.5 % — ABNORMAL HIGH (ref 4.0–12.0)
BKR WAM MPV: 11.1 fL (ref 8.0–12.0)
BKR WAM NEUTROPHILS: 58.4 % (ref 39.0–72.0)
BKR WAM PLATELETS: 286 x1000/??L (ref 150–420)
BKR WAM RDW-CV: 13.5 % (ref 11.0–15.0)
BKR WAM RED BLOOD CELL COUNT.: 4.44 M/??L (ref 4.00–6.00)
BKR WAM WHITE BLOOD CELL COUNT: 5.3 x1000/??L (ref 4.0–11.0)

## 2021-09-30 LAB — LIPID PANEL
BKR CHOLESTEROL/HDL RATIO: 3.4 (ref 0.0–5.0)
BKR CHOLESTEROL: 161 mg/dL
BKR HDL CHOLESTEROL: 48 mg/dL (ref >=40)
BKR LDL CHOLESTEROL SAMPSON CALCULATED: 100 mg/dL — ABNORMAL HIGH
BKR TRIGLYCERIDES: 68 mg/dL

## 2021-09-30 LAB — TSH W/REFLEX TO FT4     (BH GH LMW Q YH): BKR THYROID STIMULATING HORMONE: 1.74 ??IU/mL

## 2021-10-02 ENCOUNTER — Encounter: Admit: 2021-10-02 | Payer: PRIVATE HEALTH INSURANCE | Attending: Internal Medicine | Primary: Internal Medicine

## 2021-10-02 ENCOUNTER — Telehealth: Admit: 2021-10-02 | Payer: PRIVATE HEALTH INSURANCE | Attending: Internal Medicine | Primary: Internal Medicine

## 2021-10-02 LAB — PSA, TOTAL (SCREENING) (BH GH L LMW YH): BKR PROSTATE SPECIFIC ANTIGEN, SCREENING: 0.648 ng/mL (ref 0.000–3.900)

## 2021-10-02 LAB — CK     (BH GH L LMW YH)
BKR ANION GAP: 706 U/L — ABNORMAL HIGH (ref 11–204)
BKR CREATINE KINASE TOTAL: 706 U/L — ABNORMAL HIGH (ref 11–204)

## 2021-10-03 ENCOUNTER — Telehealth: Admit: 2021-10-03 | Payer: PRIVATE HEALTH INSURANCE | Attending: Internal Medicine | Primary: Internal Medicine

## 2021-10-03 ENCOUNTER — Encounter: Admit: 2021-10-03 | Payer: PRIVATE HEALTH INSURANCE | Attending: Internal Medicine | Primary: Internal Medicine

## 2021-10-03 LAB — ANA IFA SCREEN W/REFL TO TITER AND PATTERN, IFA: BKR ANTI-NUCLEAR ANTIBODY (ANA): <1:80 {titer}

## 2021-10-11 ENCOUNTER — Inpatient Hospital Stay: Admit: 2021-10-11 | Discharge: 2021-10-11 | Payer: BLUE CROSS/BLUE SHIELD | Primary: Internal Medicine

## 2021-10-11 ENCOUNTER — Ambulatory Visit: Admit: 2021-10-11 | Payer: BLUE CROSS/BLUE SHIELD | Attending: Internal Medicine | Primary: Internal Medicine

## 2021-10-11 DIAGNOSIS — Z8572 Personal history of non-Hodgkin lymphomas: Secondary | ICD-10-CM

## 2021-10-11 DIAGNOSIS — N4889 Other specified disorders of penis: Secondary | ICD-10-CM

## 2021-10-11 DIAGNOSIS — R7989 Other specified abnormal findings of blood chemistry: Secondary | ICD-10-CM

## 2021-10-11 DIAGNOSIS — R748 Abnormal levels of other serum enzymes: Secondary | ICD-10-CM

## 2021-10-12 ENCOUNTER — Telehealth: Admit: 2021-10-12 | Payer: PRIVATE HEALTH INSURANCE | Attending: Internal Medicine | Primary: Internal Medicine

## 2021-10-12 LAB — HEPATIC FUNCTION PANEL
BKR A/G RATIO: 2 x 1000/??L (ref 1.0–2.2)
BKR ALANINE AMINOTRANSFERASE (ALT): 29 U/L (ref 9–59)
BKR ALBUMIN: 4.3 g/dL (ref 3.6–4.9)
BKR ALKALINE PHOSPHATASE: 78 U/L (ref 9–122)
BKR ASPARTATE AMINOTRANSFERASE (AST): 32 U/L (ref 10–35)
BKR AST/ALT RATIO: 1.1
BKR BILIRUBIN DIRECT: <0.2 mg/dL (ref <=0.3)
BKR BILIRUBIN TOTAL: 0.4 mg/dL (ref 0.0–<=1.2)
BKR GLOBULIN: 2.2 g/dL — ABNORMAL LOW (ref 2.3–3.5)
BKR PROTEIN TOTAL: 6.5 g/dL — ABNORMAL LOW (ref 6.6–8.7)
BKR WAM NUCLEATED RED BLOOD CELLS: 78 U/L (ref 9–122)

## 2021-10-12 LAB — CK     (BH GH L LMW YH): BKR CREATINE KINASE TOTAL: 297 U/L — ABNORMAL HIGH (ref 11–204)

## 2021-10-27 ENCOUNTER — Other Ambulatory Visit: Payer: Self-pay | Admitting: Oncology

## 2021-10-30 ENCOUNTER — Inpatient Hospital Stay: Payer: BC Managed Care – PPO | Attending: Oncology

## 2021-10-30 DIAGNOSIS — C9211 Chronic myeloid leukemia, BCR/ABL-positive, in remission: Secondary | ICD-10-CM | POA: Insufficient documentation

## 2021-10-30 LAB — CMP (CANCER CENTER ONLY)
ALT: 36 U/L (ref 0–44)
AST: 34 U/L (ref 15–41)
Albumin: 4.3 g/dL (ref 3.5–5.0)
Alkaline Phosphatase: 56 U/L (ref 38–126)
Anion gap: 6 (ref 5–15)
BUN: 23 mg/dL (ref 8–23)
CO2: 29 mmol/L (ref 22–32)
Calcium: 9.5 mg/dL (ref 8.9–10.3)
Chloride: 104 mmol/L (ref 98–111)
Creatinine: 1.55 mg/dL — ABNORMAL HIGH (ref 0.61–1.24)
GFR, Estimated: 50 mL/min — ABNORMAL LOW (ref >60)
Glucose, Bld: 92 mg/dL (ref 70–99)
Potassium: 4 mmol/L (ref 3.5–5.1)
Sodium: 139 mmol/L (ref 135–145)
Total Bilirubin: 0.5 mg/dL (ref 0.3–1.2)
Total Protein: 6.9 g/dL (ref 6.5–8.1)

## 2021-10-30 LAB — CBC WITH DIFFERENTIAL (CANCER CENTER ONLY)
Abs Immature Granulocytes: 0.01 10*3/uL (ref 0.00–0.07)
Basophils Absolute: 0 10*3/uL (ref 0.0–0.1)
Basophils Relative: 1 %
Eosinophils Absolute: 1.2 10*3/uL — ABNORMAL HIGH (ref 0.0–0.5)
Eosinophils Relative: 20 %
HCT: 33.5 % — ABNORMAL LOW (ref 39.0–52.0)
Hemoglobin: 10.9 g/dL — ABNORMAL LOW (ref 13.0–17.0)
Immature Granulocytes: 0 %
Lymphocytes Relative: 28 %
Lymphs Abs: 1.6 10*3/uL (ref 0.7–4.0)
MCH: 29.4 pg (ref 26.0–34.0)
MCHC: 32.5 g/dL (ref 30.0–36.0)
MCV: 90.3 fL (ref 80.0–100.0)
Monocytes Absolute: 0.7 10*3/uL (ref 0.1–1.0)
Monocytes Relative: 11 %
Neutro Abs: 2.3 10*3/uL (ref 1.7–7.7)
Neutrophils Relative %: 40 %
Platelet Count: 161 10*3/uL (ref 150–400)
RBC: 3.71 MIL/uL — ABNORMAL LOW (ref 4.22–5.81)
RDW: 12.8 % (ref 11.5–15.5)
WBC Count: 5.8 10*3/uL (ref 4.0–10.5)
nRBC: 0 % (ref 0.0–0.2)

## 2021-11-09 ENCOUNTER — Ambulatory Visit: Admit: 2021-11-09 | Payer: BLUE CROSS/BLUE SHIELD | Attending: Family | Primary: Internal Medicine

## 2021-11-09 ENCOUNTER — Encounter: Admit: 2021-11-09 | Payer: PRIVATE HEALTH INSURANCE | Attending: Family | Primary: Internal Medicine

## 2021-11-09 MED ORDER — VALACYCLOVIR 1 GRAM TABLET
1000 | ORAL_TABLET | Freq: Three times a day (TID) | ORAL | 1 refills | 10.00000 days | Status: AC
Start: 2021-11-09 — End: ?

## 2021-11-10 ENCOUNTER — Encounter: Admit: 2021-11-10 | Payer: PRIVATE HEALTH INSURANCE | Attending: Internal Medicine | Primary: Internal Medicine

## 2021-11-10 ENCOUNTER — Encounter: Admit: 2021-11-10 | Payer: PRIVATE HEALTH INSURANCE | Attending: Family | Primary: Internal Medicine

## 2021-11-10 ENCOUNTER — Telehealth: Admit: 2021-11-10 | Payer: PRIVATE HEALTH INSURANCE | Attending: Internal Medicine | Primary: Internal Medicine

## 2021-11-12 LAB — BCR/ABL

## 2021-11-14 ENCOUNTER — Telehealth: Payer: Self-pay | Admitting: *Deleted

## 2021-11-14 NOTE — Telephone Encounter (Signed)
-----   Message from Ladell Pier, MD sent at 11/12/2021  6:20 PM EDT ----- Please call patient, the PCR looks good, detected below level of reporting

## 2021-11-14 NOTE — Telephone Encounter (Signed)
Patient notified of PCR/ABL test results. Detected below the level of reporting. F/U as scheduled.

## 2021-11-20 ENCOUNTER — Encounter: Admit: 2021-11-20 | Payer: PRIVATE HEALTH INSURANCE | Attending: Internal Medicine | Primary: Internal Medicine

## 2021-12-01 ENCOUNTER — Other Ambulatory Visit: Payer: Self-pay | Admitting: Oncology

## 2021-12-04 ENCOUNTER — Telehealth: Payer: Self-pay

## 2021-12-04 NOTE — Telephone Encounter (Signed)
VM from pt stating that [facility] [phone]) had been trying to contact the [place]. This RN spoke with [facility] regarding his recent [place] refill. Per [facility], gleevec refill has been processed and is ready to set up delivery with pt.

## 2021-12-12 ENCOUNTER — Telehealth: Payer: Self-pay | Admitting: *Deleted

## 2021-12-12 NOTE — Chronic Care Management (AMB) (Signed)
  Care Coordination   Note   12/12/2021 Name: Patrick Nash MRN: [id number] DOB: November 30, 1960  Patrick Nash is a 61 y.o. year old male who sees Crist Infante, MD for primary care. I reached out to Valinda Hoar by phone today to offer care coordination services.  Patrick Nash was given information about Care Coordination services today including:   The Care Coordination services include support from the care team which includes your Nurse Coordinator, Clinical Social Worker, or Pharmacist.  The Care Coordination team is here to help remove barriers to the health concerns and goals most important to you. Care Coordination services are voluntary, and the patient may decline or stop services at any time by request to their care team member.   Care Coordination Consent Status: Patient did not agree to participate in care coordination services at this time.    Encounter Outcome:  Pt. Refused  [place]  Direct Dial: [phone]

## 2021-12-12 NOTE — Chronic Care Management (AMB) (Signed)
  Care Coordination  Outreach Note  12/12/2021 Name: Patrick Nash MRN: [id number] DOB: November 22, 1960   Care Coordination Outreach Attempts  An unsuccessful telephone outreach was attempted today to offer the patient information about available care coordination services as a benefit of their health plan.   Follow Up Plan:  Additional outreach attempts will be made to offer the patient care coordination information and services.   Encounter Outcome:  No Answer  [place]  Direct Dial: [phone]

## 2021-12-13 ENCOUNTER — Encounter
Admit: 2021-12-13 | Payer: PRIVATE HEALTH INSURANCE | Attending: Vascular and Interventional Radiology | Primary: Internal Medicine

## 2021-12-13 DIAGNOSIS — Z125 Encounter for screening for malignant neoplasm of prostate: Secondary | ICD-10-CM | POA: Diagnosis not present

## 2021-12-13 DIAGNOSIS — E785 Hyperlipidemia, unspecified: Secondary | ICD-10-CM | POA: Diagnosis not present

## 2021-12-13 DIAGNOSIS — R7301 Impaired fasting glucose: Secondary | ICD-10-CM | POA: Diagnosis not present

## 2021-12-14 ENCOUNTER — Encounter
Admit: 2021-12-14 | Payer: PRIVATE HEALTH INSURANCE | Attending: Vascular and Interventional Radiology | Primary: Internal Medicine

## 2021-12-15 ENCOUNTER — Inpatient Hospital Stay: Admit: 2021-12-15 | Discharge: 2021-12-15 | Payer: BLUE CROSS/BLUE SHIELD | Primary: Internal Medicine

## 2021-12-15 ENCOUNTER — Encounter: Admit: 2021-12-15 | Payer: PRIVATE HEALTH INSURANCE | Attending: Internal Medicine | Primary: Internal Medicine

## 2021-12-15 ENCOUNTER — Encounter: Admit: 2021-12-15 | Payer: PRIVATE HEALTH INSURANCE | Attending: Family | Primary: Internal Medicine

## 2021-12-15 ENCOUNTER — Ambulatory Visit: Admit: 2021-12-15 | Payer: BLUE CROSS/BLUE SHIELD | Attending: Family | Primary: Internal Medicine

## 2021-12-15 DIAGNOSIS — S76391A Other specified injury of muscle, fascia and tendon of the posterior muscle group at thigh level, right thigh, initial encounter: Secondary | ICD-10-CM

## 2021-12-15 DIAGNOSIS — Z01818 Encounter for other preprocedural examination: Secondary | ICD-10-CM

## 2021-12-15 LAB — CBC WITH AUTO DIFFERENTIAL
BKR AST/ALT RATIO: 0.24 x 1000/??L (ref 0.00–1.00)
BKR CHLORIDE: 97.9 fL (ref 80.0–100.0)
BKR WAM ABSOLUTE IMMATURE GRANULOCYTES.: 0.02 x 1000/??L (ref 0.00–0.30)
BKR WAM ABSOLUTE LYMPHOCYTE COUNT.: 1.59 x 1000/??L — ABNORMAL LOW (ref 0.60–3.70)
BKR WAM ABSOLUTE NRBC (2 DEC): 0 x 1000/??L — ABNORMAL HIGH (ref 0.00–1.00)
BKR WAM ANALYZER ANC: 3.84 x 1000/??L (ref 2.00–7.60)
BKR WAM BASOPHIL ABSOLUTE COUNT.: 0.07 x 1000/??L (ref 0.00–1.00)
BKR WAM BASOPHILS: 1.1 % (ref 0.0–1.4)
BKR WAM EOSINOPHIL ABSOLUTE COUNT.: 0.24 x 1000/??L (ref 0.00–1.00)
BKR WAM EOSINOPHILS: 3.8 % (ref 0.0–5.0)
BKR WAM HEMATOCRIT (2 DEC): 42 % (ref 38.50–50.00)
BKR WAM HEMOGLOBIN: 13.8 g/dL (ref 13.2–17.1)
BKR WAM IMMATURE GRANULOCYTES: 0.3 % (ref 0.0–1.0)
BKR WAM LYMPHOCYTES: 25 % (ref 17.0–50.0)
BKR WAM MCH (PG): 32.2 pg (ref 27.0–33.0)
BKR WAM MCHC: 32.9 g/dL (ref 31.0–36.0)
BKR WAM MCV: 97.9 fL (ref 80.0–100.0)
BKR WAM MONOCYTE ABSOLUTE COUNT.: 0.61 x 1000/??L (ref 0.00–1.00)
BKR WAM MONOCYTES: 9.6 % (ref 4.0–12.0)
BKR WAM MPV: 10.9 fL (ref 8.0–12.0)
BKR WAM NEUTROPHILS: 60.2 % (ref 39.0–72.0)
BKR WAM NUCLEATED RED BLOOD CELLS: 0 % (ref 0.0–1.0)
BKR WAM PLATELETS: 318 x1000/??L (ref 150–420)
BKR WAM RDW-CV: 14.5 % (ref 11.0–15.0)
BKR WAM RED BLOOD CELL COUNT.: 4.29 M/??L (ref 4.00–6.00)
BKR WAM WHITE BLOOD CELL COUNT: 6.4 x1000/??L (ref 4.0–11.0)

## 2021-12-16 LAB — COMPREHENSIVE METABOLIC PANEL
BKR A/G RATIO: 2.2 (ref 1.0–2.2)
BKR ALANINE AMINOTRANSFERASE (ALT): 23 U/L (ref 9–59)
BKR ALBUMIN: 4.6 g/dL (ref 3.6–4.9)
BKR ALKALINE PHOSPHATASE: 74 U/L (ref 9–122)
BKR ANION GAP: 12 (ref 7–17)
BKR ASPARTATE AMINOTRANSFERASE (AST): 36 U/L — ABNORMAL HIGH (ref 10–35)
BKR BILIRUBIN TOTAL: 0.3 mg/dL (ref 0.0–<=1.2)
BKR BLOOD UREA NITROGEN: 21 mg/dL (ref 8–23)
BKR BUN / CREAT RATIO: 17.2 (ref 8.0–23.0)
BKR CALCIUM: 9.7 mg/dL (ref 8.8–10.2)
BKR CO2: 27 mmol/L (ref 20–30)
BKR CREATININE: 1.22 mg/dL (ref 0.40–1.30)
BKR EGFR, CREATININE (CKD-EPI 2021): >60 mL/min/{1.73_m2} (ref >=60)
BKR GLOBULIN: 2.1 g/dL — ABNORMAL LOW (ref 2.3–3.5)
BKR GLUCOSE: 92 mg/dL (ref 70–100)
BKR POTASSIUM: 4.5 mmol/L (ref 3.3–5.3)
BKR PROTEIN TOTAL: 6.7 g/dL (ref 6.6–8.7)
BKR SODIUM: 140 mmol/L (ref 136–144)

## 2021-12-18 DIAGNOSIS — R82998 Other abnormal findings in urine: Secondary | ICD-10-CM | POA: Diagnosis not present

## 2021-12-18 DIAGNOSIS — Z1212 Encounter for screening for malignant neoplasm of rectum: Secondary | ICD-10-CM | POA: Diagnosis not present

## 2021-12-18 DIAGNOSIS — R7301 Impaired fasting glucose: Secondary | ICD-10-CM | POA: Diagnosis not present

## 2021-12-19 NOTE — Other
Pre-op

## 2021-12-20 DIAGNOSIS — Z0000 Encounter for general adult medical examination without abnormal findings: Secondary | ICD-10-CM | POA: Diagnosis not present

## 2021-12-20 DIAGNOSIS — Z1389 Encounter for screening for other disorder: Secondary | ICD-10-CM | POA: Diagnosis not present

## 2021-12-20 DIAGNOSIS — C9590 Leukemia, unspecified not having achieved remission: Secondary | ICD-10-CM | POA: Diagnosis not present

## 2022-01-03 ENCOUNTER — Encounter: Admit: 2022-01-03 | Payer: PRIVATE HEALTH INSURANCE | Attending: Internal Medicine | Primary: Internal Medicine

## 2022-01-13 ENCOUNTER — Encounter: Admit: 2022-01-13 | Payer: PRIVATE HEALTH INSURANCE | Primary: Internal Medicine

## 2022-01-29 ENCOUNTER — Inpatient Hospital Stay: Payer: BC Managed Care – PPO | Attending: Oncology | Admitting: *Deleted

## 2022-01-29 DIAGNOSIS — C9211 Chronic myeloid leukemia, BCR/ABL-positive, in remission: Secondary | ICD-10-CM | POA: Diagnosis not present

## 2022-01-29 DIAGNOSIS — N189 Chronic kidney disease, unspecified: Secondary | ICD-10-CM | POA: Diagnosis not present

## 2022-01-29 DIAGNOSIS — I129 Hypertensive chronic kidney disease with stage 1 through stage 4 chronic kidney disease, or unspecified chronic kidney disease: Secondary | ICD-10-CM | POA: Insufficient documentation

## 2022-01-29 DIAGNOSIS — D721 Eosinophilia, unspecified: Secondary | ICD-10-CM | POA: Insufficient documentation

## 2022-01-29 DIAGNOSIS — Z23 Encounter for immunization: Secondary | ICD-10-CM | POA: Diagnosis not present

## 2022-01-29 DIAGNOSIS — R011 Cardiac murmur, unspecified: Secondary | ICD-10-CM | POA: Insufficient documentation

## 2022-01-29 DIAGNOSIS — C9210 Chronic myeloid leukemia, BCR/ABL-positive, not having achieved remission: Secondary | ICD-10-CM | POA: Diagnosis not present

## 2022-01-29 LAB — CMP (CANCER CENTER ONLY)
ALT: 32 U/L (ref 0–44)
AST: 29 U/L (ref 15–41)
Albumin: 4.3 g/dL (ref 3.5–5.0)
Alkaline Phosphatase: 58 U/L (ref 38–126)
Anion gap: 7 (ref 5–15)
BUN: 28 mg/dL — ABNORMAL HIGH (ref 8–23)
CO2: 30 mmol/L (ref 22–32)
Calcium: 9.2 mg/dL (ref 8.9–10.3)
Chloride: 103 mmol/L (ref 98–111)
Creatinine: 1.62 mg/dL — ABNORMAL HIGH (ref 0.61–1.24)
GFR, Estimated: 48 mL/min — ABNORMAL LOW (ref >60)
Glucose, Bld: 76 mg/dL (ref 70–99)
Potassium: 3.9 mmol/L (ref 3.5–5.1)
Sodium: 140 mmol/L (ref 135–145)
Total Bilirubin: 0.5 mg/dL (ref 0.3–1.2)
Total Protein: 6.6 g/dL (ref 6.5–8.1)

## 2022-01-29 LAB — CEA (ACCESS): CEA (CHCC): <1 ng/mL (ref 0.00–5.00)

## 2022-02-03 ENCOUNTER — Ambulatory Visit: Admit: 2022-02-03 | Payer: BLUE CROSS/BLUE SHIELD | Attending: Family | Primary: Internal Medicine

## 2022-02-03 MED ORDER — METHOCARBAMOL 750 MG TABLET
750 | ORAL | 1.00 refills | 13.00000 days | Status: AC
Start: 2022-02-03 — End: 2022-03-19

## 2022-02-03 MED ORDER — ASPIRIN 81 MG TABLET,DELAYED RELEASE
81 | ORAL | 0.00 refills | 90.00000 days | Status: AC
Start: 2022-02-03 — End: 2022-03-19

## 2022-02-05 ENCOUNTER — Inpatient Hospital Stay: Payer: BC Managed Care – PPO

## 2022-02-05 ENCOUNTER — Inpatient Hospital Stay (HOSPITAL_BASED_OUTPATIENT_CLINIC_OR_DEPARTMENT_OTHER): Payer: BC Managed Care – PPO | Admitting: Oncology

## 2022-02-05 VITALS — BP 139/72 | HR 86 | Temp 98.2°F | Resp 18 | Ht 68.0 in | Wt 155.8 lb

## 2022-02-05 DIAGNOSIS — Z23 Encounter for immunization: Secondary | ICD-10-CM

## 2022-02-05 DIAGNOSIS — R011 Cardiac murmur, unspecified: Secondary | ICD-10-CM | POA: Diagnosis not present

## 2022-02-05 DIAGNOSIS — C9211 Chronic myeloid leukemia, BCR/ABL-positive, in remission: Secondary | ICD-10-CM

## 2022-02-05 DIAGNOSIS — D721 Eosinophilia, unspecified: Secondary | ICD-10-CM | POA: Diagnosis not present

## 2022-02-05 DIAGNOSIS — I129 Hypertensive chronic kidney disease with stage 1 through stage 4 chronic kidney disease, or unspecified chronic kidney disease: Secondary | ICD-10-CM | POA: Diagnosis not present

## 2022-02-05 DIAGNOSIS — N189 Chronic kidney disease, unspecified: Secondary | ICD-10-CM | POA: Diagnosis not present

## 2022-02-05 MED ORDER — INFLUENZA VAC SPLIT QUAD 0.5 ML IM SUSY
0.5000 mL | PREFILLED_SYRINGE | Freq: Once | INTRAMUSCULAR | Status: AC
  Administered 2022-02-05: 0.5 mL via INTRAMUSCULAR
  Filled 2022-02-05: qty 0.5

## 2022-02-05 NOTE — Progress Notes (Signed)
  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  He continues Gleevec at a dose of 200 mg daily.  No swelling or rash.  He feels well.  He reports discomfort at the left upper lateral thigh for the past few weeks.  This has improved today.  Objective:  Vital signs in last 24 hours:  Blood pressure 139/72, pulse 86, temperature 98.2 F (36.8 C), temperature source Oral, resp. rate 18, height '5\' 8"'$  (1.727 m), weight 155 lb 12.8 oz (70.7 kg), SpO2 100 %.    HEENT: No thrush or ulcers Resp: Lungs clear bilaterally Cardio: Regular rate and rhythm GI: No hepatosplenomegaly Vascular: No leg edema Musculoskeletal: No pain with motion at the left hip    Lab Results:  Lab Results  Component Value Date   WBC 5.8 10/30/2021   HGB 10.9 (L) 10/30/2021   HCT 33.5 (L) 10/30/2021   MCV 90.3 10/30/2021   PLT 161 10/30/2021   NEUTROABS 2.3 10/30/2021    CMP  Lab Results  Component Value Date   NA 140 01/29/2022   K 3.9 01/29/2022   CL 103 01/29/2022   CO2 30 01/29/2022   GLUCOSE 76 01/29/2022   BUN 28 (H) 01/29/2022   CREATININE 1.62 (H) 01/29/2022   CALCIUM 9.2 01/29/2022   PROT 6.6 01/29/2022   ALBUMIN 4.3 01/29/2022   AST 29 01/29/2022   ALT 32 01/29/2022   ALKPHOS 58 01/29/2022   BILITOT 0.5 01/29/2022   GFRNONAA 48 (L) 01/29/2022   GFRAA 51 (L) 11/09/2019    Lab Results  Component Value Date   CEA <1.00 01/29/2022    Medications: I have reviewed the patient's current medications.   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 Gleevec discontinued April 2017, resumed in June 2017 after a rise in the peripheral blood BCR-ABL Gleevec resumed and he again entered molecular remission Peripheral blood PCR detectable 11/09/2019, not detected 02/09/2020 Gleevec decreased to 200 mg daily beginning 02/24/2020 secondary to anemia/thrombocytopenia Peripheral blood  PCR 09/20/2020-detected, below limit of reporting Peripheral blood FISH 01/23/2021-negative Peripheral blood PCR 07/17/2021-0.01% Peripheral blood PCR 10/30/2021-detected below the level of measurement Hypertension Chronic renal insufficiency Chronic eosinophilia Systolic heart murmur noted on exam 11/13/2018 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  He continues to tolerate the [place] well.  He remains in hematologic and cytogenetic remission.  The peripheral blood PCR from 01/29/2022 is pending.  He was scheduled to have a CBC on 01/29/2022.  A CEA was drawn instead.  He does not wish to have a CBC until he returns in 3 months.  He plans to obtain influenza and COVID-19 vaccines today.  Betsy Coder, MD  02/05/2022  2:21 PM

## 2022-02-08 LAB — BCR/ABL

## 2022-02-09 NOTE — Progress Notes (Signed)
No answer

## 2022-02-12 ENCOUNTER — Encounter: Payer: Self-pay | Admitting: *Deleted

## 2022-02-21 DIAGNOSIS — N183 Chronic kidney disease, stage 3 unspecified: Secondary | ICD-10-CM | POA: Diagnosis not present

## 2022-02-21 DIAGNOSIS — D631 Anemia in chronic kidney disease: Secondary | ICD-10-CM | POA: Diagnosis not present

## 2022-02-21 DIAGNOSIS — I129 Hypertensive chronic kidney disease with stage 1 through stage 4 chronic kidney disease, or unspecified chronic kidney disease: Secondary | ICD-10-CM | POA: Diagnosis not present

## 2022-02-21 DIAGNOSIS — N2581 Secondary hyperparathyroidism of renal origin: Secondary | ICD-10-CM | POA: Diagnosis not present

## 2022-03-01 ENCOUNTER — Other Ambulatory Visit: Payer: Self-pay | Admitting: Oncology

## 2022-03-13 ENCOUNTER — Ambulatory Visit: Admit: 2022-03-13 | Payer: BLUE CROSS/BLUE SHIELD | Attending: Family | Primary: Internal Medicine

## 2022-03-13 ENCOUNTER — Encounter: Admit: 2022-03-13 | Payer: PRIVATE HEALTH INSURANCE | Attending: Family | Primary: Internal Medicine

## 2022-03-14 ENCOUNTER — Encounter: Admit: 2022-03-14 | Payer: PRIVATE HEALTH INSURANCE | Attending: Family | Primary: Internal Medicine

## 2022-03-14 ENCOUNTER — Encounter: Admit: 2022-03-14 | Payer: PRIVATE HEALTH INSURANCE | Attending: Internal Medicine | Primary: Internal Medicine

## 2022-03-14 ENCOUNTER — Inpatient Hospital Stay: Admit: 2022-03-14 | Discharge: 2022-03-14 | Payer: BLUE CROSS/BLUE SHIELD | Primary: Internal Medicine

## 2022-03-14 ENCOUNTER — Telehealth: Admit: 2022-03-14 | Payer: PRIVATE HEALTH INSURANCE | Attending: Internal Medicine | Primary: Internal Medicine

## 2022-03-14 DIAGNOSIS — N63 Unspecified lump in unspecified breast: Secondary | ICD-10-CM

## 2022-03-14 DIAGNOSIS — N6320 Unspecified lump in the left breast, unspecified quadrant: Secondary | ICD-10-CM

## 2022-03-15 ENCOUNTER — Inpatient Hospital Stay: Admit: 2022-03-15 | Discharge: 2022-03-15 | Payer: BLUE CROSS/BLUE SHIELD | Primary: Internal Medicine

## 2022-03-15 ENCOUNTER — Inpatient Hospital Stay: Admit: 2022-03-15 | Payer: PRIVATE HEALTH INSURANCE | Primary: Internal Medicine

## 2022-03-15 DIAGNOSIS — N63 Unspecified lump in unspecified breast: Secondary | ICD-10-CM

## 2022-03-19 ENCOUNTER — Encounter: Admit: 2022-03-19 | Payer: PRIVATE HEALTH INSURANCE | Attending: Internal Medicine | Primary: Internal Medicine

## 2022-03-19 ENCOUNTER — Encounter: Admit: 2022-03-19 | Payer: BLUE CROSS/BLUE SHIELD | Attending: Internal Medicine | Primary: Internal Medicine

## 2022-03-19 MED ORDER — DOXYLAMINE SUCCINATE 25 MG TABLET
25 | Freq: Every evening | ORAL | 2.00 refills | 30.00000 days | Status: AC | PRN
Start: 2022-03-19 — End: ?

## 2022-03-24 ENCOUNTER — Inpatient Hospital Stay: Admit: 2022-03-24 | Discharge: 2022-03-24 | Payer: BLUE CROSS/BLUE SHIELD | Primary: Internal Medicine

## 2022-03-24 DIAGNOSIS — N62 Hypertrophy of breast: Secondary | ICD-10-CM

## 2022-03-24 DIAGNOSIS — Z8572 Personal history of non-Hodgkin lymphomas: Secondary | ICD-10-CM

## 2022-03-24 LAB — CBC WITH AUTO DIFFERENTIAL
BKR WAM ABSOLUTE IMMATURE GRANULOCYTES.: 0.04 x 1000/??L (ref 0.00–0.30)
BKR WAM ABSOLUTE LYMPHOCYTE COUNT.: 1.17 x 1000/ÂµL (ref 0.60–3.70)
BKR WAM ABSOLUTE NRBC (2 DEC): 0 x 1000/??L (ref 0.00–1.00)
BKR WAM ANALYZER ANC: 5.06 x 1000/??L (ref 2.00–7.60)
BKR WAM BASOPHIL ABSOLUTE COUNT.: 0.07 x 1000/??L (ref 0.00–1.00)
BKR WAM BASOPHILS: 1 % (ref 0.0–1.4)
BKR WAM EOSINOPHIL ABSOLUTE COUNT.: 0.06 x 1000/??L (ref 0.00–1.00)
BKR WAM EOSINOPHILS: 0.9 % (ref 0.0–5.0)
BKR WAM HEMATOCRIT (2 DEC): 47.3 % (ref 38.50–50.00)
BKR WAM HEMOGLOBIN: 15.1 g/dL (ref 13.2–17.1)
BKR WAM IMMATURE GRANULOCYTES: 0.6 % (ref 0.0–1.0)
BKR WAM LYMPHOCYTES: 16.9 % — ABNORMAL LOW (ref 17.0–50.0)
BKR WAM MCH (PG): 31 pg (ref 27.0–33.0)
BKR WAM MCHC: 31.9 g/dL (ref 31.0–36.0)
BKR WAM MCV: 97.1 fL (ref 80.0–100.0)
BKR WAM MONOCYTE ABSOLUTE COUNT.: 0.52 x 1000/??L (ref 0.00–1.00)
BKR WAM MONOCYTES: 7.5 % (ref 4.0–12.0)
BKR WAM MPV: 11 fL (ref 8.0–12.0)
BKR WAM NEUTROPHILS: 73.1 % — ABNORMAL HIGH (ref 39.0–72.0)
BKR WAM NUCLEATED RED BLOOD CELLS: 0 % (ref 0.0–1.0)
BKR WAM PLATELETS: 300 x1000/ÂµL (ref 150–420)
BKR WAM RDW-CV: 13.5 % (ref 11.0–15.0)
BKR WAM RED BLOOD CELL COUNT.: 4.87 M/??L (ref 4.00–6.00)
BKR WAM WHITE BLOOD CELL COUNT: 6.9 x1000/??L (ref 4.0–11.0)

## 2022-03-24 LAB — COMPREHENSIVE METABOLIC PANEL
BKR A/G RATIO: 1.4 (ref 1.0–2.2)
BKR ALANINE AMINOTRANSFERASE (ALT): 22 U/L (ref 9–59)
BKR ALBUMIN: 4.5 g/dL (ref 3.6–4.9)
BKR ALKALINE PHOSPHATASE: 89 U/L (ref 9–122)
BKR ANION GAP: 13 g/dL (ref 7–17)
BKR ASPARTATE AMINOTRANSFERASE (AST): 33 U/L (ref 10–35)
BKR AST/ALT RATIO: 1.5
BKR BILIRUBIN TOTAL: 0.6 mg/dL (ref 0.0–<=1.2)
BKR BLOOD UREA NITROGEN: 22 mg/dL (ref 8–23)
BKR BUN / CREAT RATIO: 19.6 % — ABNORMAL LOW (ref 8.0–23.0)
BKR CALCIUM: 10.1 mg/dL — ABNORMAL HIGH (ref 8.8–10.2)
BKR CHLORIDE: 102 mmol/L (ref 98–107)
BKR CO2: 27 mmol/L (ref 20–30)
BKR CREATININE: 1.12 mg/dL (ref 0.40–1.30)
BKR EGFR, CREATININE (CKD-EPI 2021): >60 mL/min/{1.73_m2} (ref >=60)
BKR GLOBULIN: 3.2 g/dL (ref 2.3–3.5)
BKR GLUCOSE: 97 mg/dL (ref 70–100)
BKR POTASSIUM: 4.8 mmol/L (ref 3.3–5.3)
BKR PROTEIN TOTAL: 7.7 g/dL (ref 6.6–8.7)
BKR SODIUM: 142 mmol/L (ref 136–144)

## 2022-03-24 LAB — LH AND FSH     (BH GH L Q YH)
BKR FOLLICLE STIMULATING HORMONE: 4.7 m[IU]/mL
BKR LUTEINIZING HORMONE: 5.8 m[IU]/mL

## 2022-03-24 LAB — ESTRADIOL: BKR ESTRADIOL LEVEL: 45.9 pg/mL — ABNORMAL HIGH (ref <44.0)

## 2022-03-24 LAB — HCG, QUANTITATIVE     (BH GH LMW YH): BKR HCG, QUANTITATIVE: <1 m[IU]/mL

## 2022-03-24 LAB — TSH W/REFLEX TO FT4     (BH GH LMW Q YH): BKR THYROID STIMULATING HORMONE: 2.86 ??IU/mL

## 2022-03-26 ENCOUNTER — Telehealth: Admit: 2022-03-26 | Payer: PRIVATE HEALTH INSURANCE | Attending: Internal Medicine | Primary: Internal Medicine

## 2022-03-30 ENCOUNTER — Other Ambulatory Visit: Payer: Self-pay | Admitting: Oncology

## 2022-04-12 ENCOUNTER — Encounter: Admit: 2022-04-12 | Payer: PRIVATE HEALTH INSURANCE | Attending: Internal Medicine | Primary: Internal Medicine

## 2022-04-19 ENCOUNTER — Other Ambulatory Visit: Payer: Self-pay | Admitting: *Deleted

## 2022-04-19 DIAGNOSIS — N1832 Chronic kidney disease, stage 3b: Secondary | ICD-10-CM

## 2022-04-19 NOTE — Progress Notes (Signed)
Request from Dr. Gean Quint w/[facility] to add phosphorus and intact PTH to his 05/08/21 labs and fax to [phone].

## 2022-05-08 ENCOUNTER — Inpatient Hospital Stay: Payer: BC Managed Care – PPO | Attending: Oncology

## 2022-05-08 DIAGNOSIS — I129 Hypertensive chronic kidney disease with stage 1 through stage 4 chronic kidney disease, or unspecified chronic kidney disease: Secondary | ICD-10-CM | POA: Diagnosis not present

## 2022-05-08 DIAGNOSIS — D631 Anemia in chronic kidney disease: Secondary | ICD-10-CM | POA: Diagnosis not present

## 2022-05-08 DIAGNOSIS — N1832 Chronic kidney disease, stage 3b: Secondary | ICD-10-CM

## 2022-05-08 DIAGNOSIS — N183 Chronic kidney disease, stage 3 unspecified: Secondary | ICD-10-CM | POA: Insufficient documentation

## 2022-05-08 DIAGNOSIS — C9211 Chronic myeloid leukemia, BCR/ABL-positive, in remission: Secondary | ICD-10-CM | POA: Insufficient documentation

## 2022-05-08 LAB — CBC WITH DIFFERENTIAL (CANCER CENTER ONLY)
Abs Immature Granulocytes: 0.01 10*3/uL (ref 0.00–0.07)
Basophils Absolute: 0.1 10*3/uL (ref 0.0–0.1)
Basophils Relative: 1 %
Eosinophils Absolute: 1.1 10*3/uL — ABNORMAL HIGH (ref 0.0–0.5)
Eosinophils Relative: 16 %
HCT: 34.8 % — ABNORMAL LOW (ref 39.0–52.0)
Hemoglobin: 11.2 g/dL — ABNORMAL LOW (ref 13.0–17.0)
Immature Granulocytes: 0 %
Lymphocytes Relative: 21 %
Lymphs Abs: 1.4 10*3/uL (ref 0.7–4.0)
MCH: 29.2 pg (ref 26.0–34.0)
MCHC: 32.2 g/dL (ref 30.0–36.0)
MCV: 90.9 fL (ref 80.0–100.0)
Monocytes Absolute: 0.7 10*3/uL (ref 0.1–1.0)
Monocytes Relative: 10 %
Neutro Abs: 3.5 10*3/uL (ref 1.7–7.7)
Neutrophils Relative %: 52 %
Platelet Count: 184 10*3/uL (ref 150–400)
RBC: 3.83 MIL/uL — ABNORMAL LOW (ref 4.22–5.81)
RDW: 13 % (ref 11.5–15.5)
WBC Count: 6.7 10*3/uL (ref 4.0–10.5)
nRBC: 0 % (ref 0.0–0.2)

## 2022-05-08 LAB — CMP (CANCER CENTER ONLY)
ALT: 34 U/L (ref 0–44)
AST: 33 U/L (ref 15–41)
Albumin: 4.2 g/dL (ref 3.5–5.0)
Alkaline Phosphatase: 69 U/L (ref 38–126)
Anion gap: 8 (ref 5–15)
BUN: 26 mg/dL — ABNORMAL HIGH (ref 8–23)
CO2: 29 mmol/L (ref 22–32)
Calcium: 9.2 mg/dL (ref 8.9–10.3)
Chloride: 101 mmol/L (ref 98–111)
Creatinine: 1.6 mg/dL — ABNORMAL HIGH (ref 0.61–1.24)
GFR, Estimated: 48 mL/min — ABNORMAL LOW (ref >60)
Glucose, Bld: 100 mg/dL — ABNORMAL HIGH (ref 70–99)
Potassium: 3.9 mmol/L (ref 3.5–5.1)
Sodium: 138 mmol/L (ref 135–145)
Total Bilirubin: 0.6 mg/dL (ref 0.3–1.2)
Total Protein: 6.9 g/dL (ref 6.5–8.1)

## 2022-05-08 LAB — PHOSPHORUS: Phosphorus: 2.9 mg/dL (ref 2.5–4.6)

## 2022-05-09 LAB — PTH, INTACT AND CALCIUM
Calcium, Total (PTH): 8.8 mg/dL (ref 8.6–10.2)
PTH: 29 pg/mL (ref 15–65)

## 2022-05-10 ENCOUNTER — Encounter
Admit: 2022-05-10 | Payer: PRIVATE HEALTH INSURANCE | Attending: Vascular and Interventional Radiology | Primary: Internal Medicine

## 2022-05-15 IMAGING — CT CT CARDIAC CORONARY ARTERY CALCIUM SCORE
3 series · 13 of 20 positions shown, 15 images · non-contrast
Comparison: None.

CLINICAL DATA: 59-year-old Caucasian male with family history of
coronary disease.

EXAM:
CT CARDIAC CORONARY ARTERY CALCIUM SCORE
TECHNIQUE: Non-contrast imaging through the heart was performed using
prospective ECG gating. Image post processing was performed on an
independent workstation, allowing for quantitative analysis of the
heart and coronary arteries. Note that this exam targets the heart
and the chest was not imaged in its entirety.

[Series 2: calcium scoring 2.00 qr36 bestdiast 70% hrt calciu · axial · 0.36mm/px · z∈[+1726,+1798]mm · 3 of 90 slices shown]
[im 18/90  vessel]
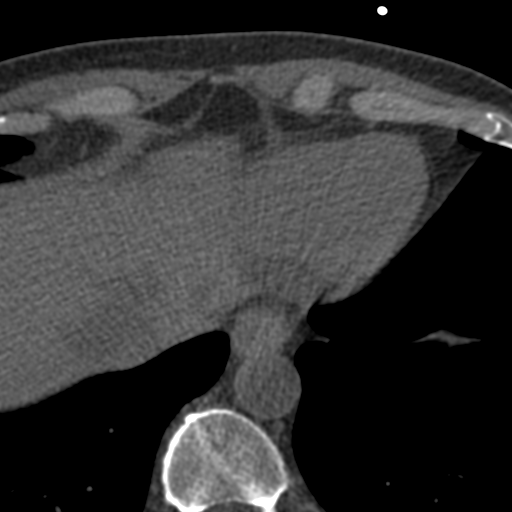
[im 36/90  vessel]
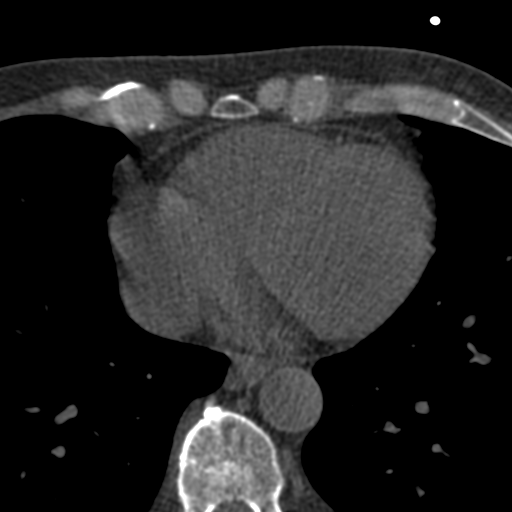
[im 54/90  vessel]
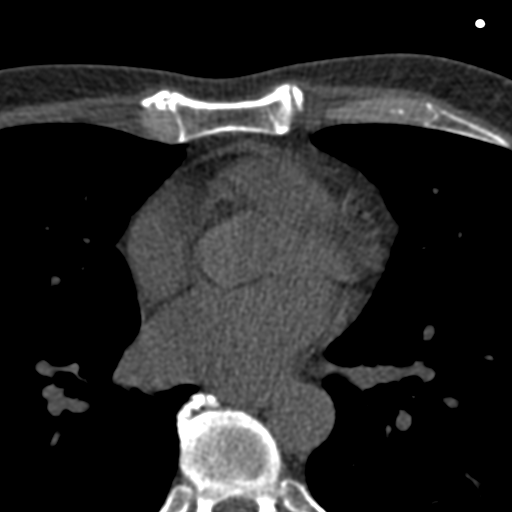

[Series 3: calcium scoring 2.00 br40 bestdiast 70% axial · axial · 0.51mm/px · z∈[+1720,+1840]mm · 5 of 90 slices shown, 7 images]
[im 15/90  vessel]
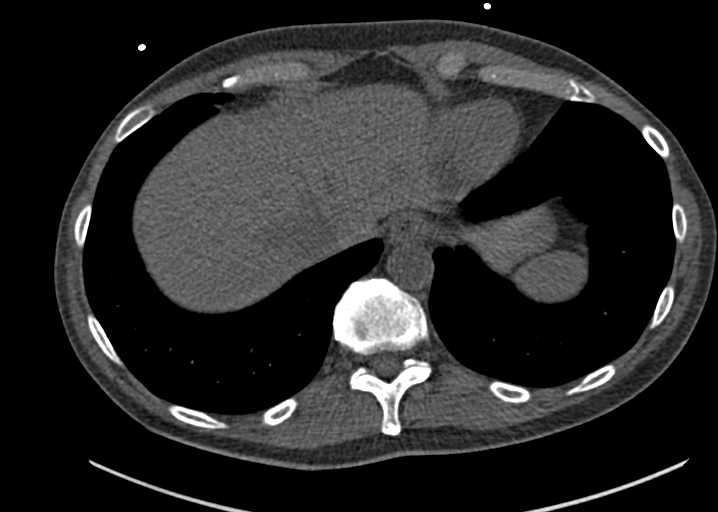
[im 15/90  lung]
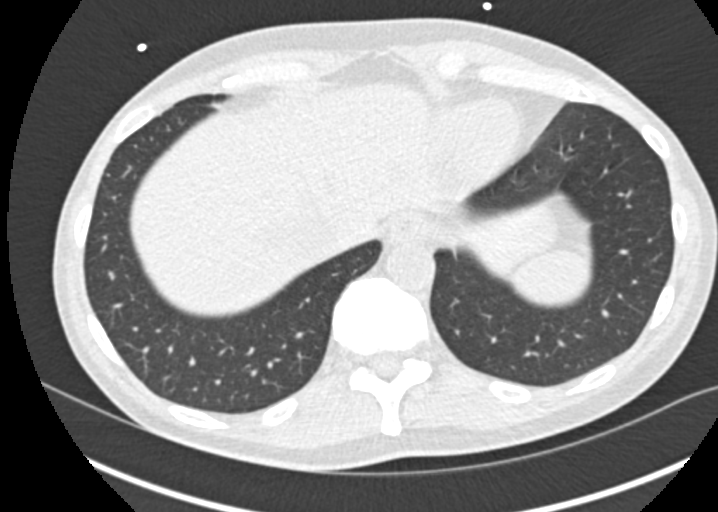
[im 30/90  vessel]
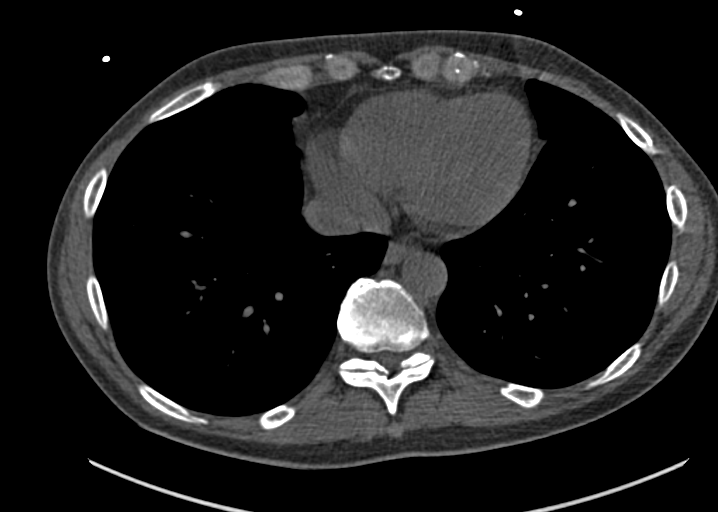
[im 45/90  vessel]
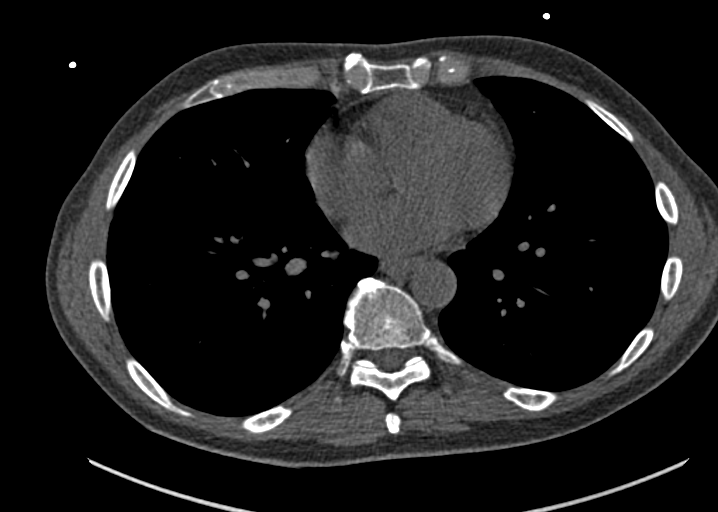
[im 60/90  vessel]
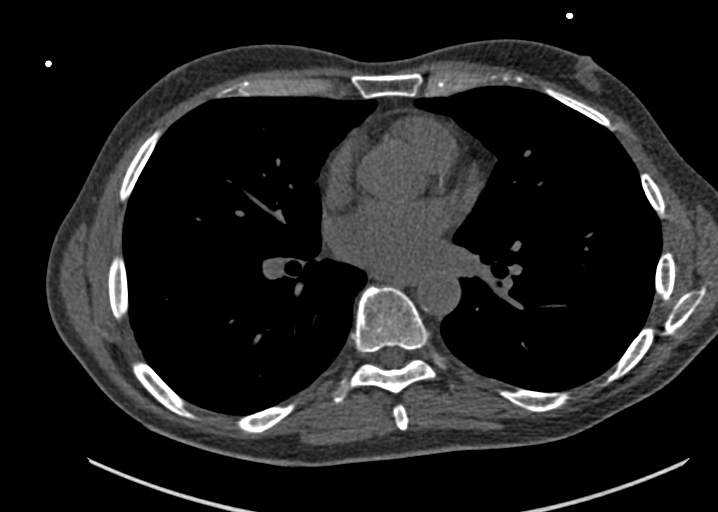
[im 75/90  vessel]
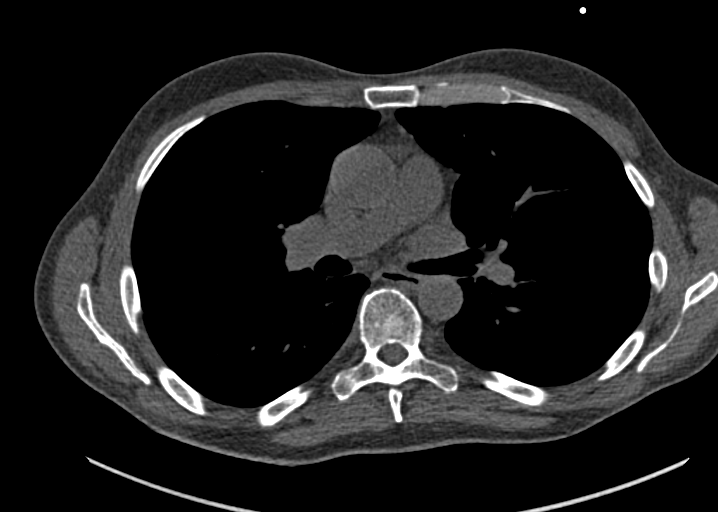
[im 75/90  lung]
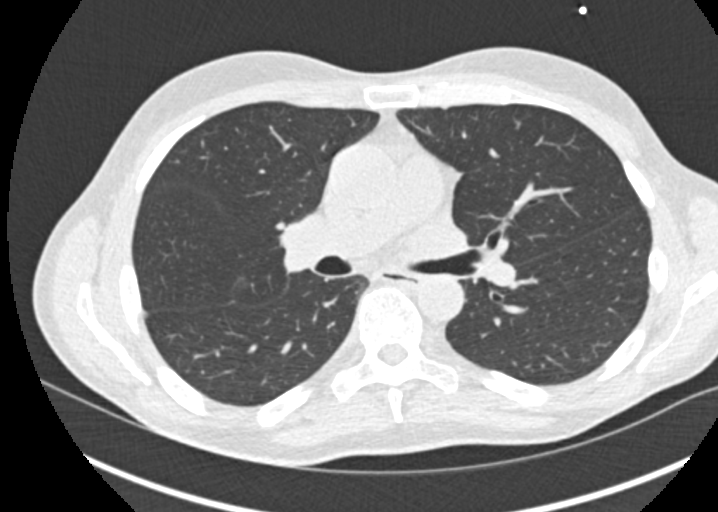

[Series 9: calcium scoring 2.00 br60 bestdiast 70% lungs · axial · 0.51mm/px · z∈[+1720,+1840]mm · 5 of 90 slices shown]
[im 15/90  vessel]
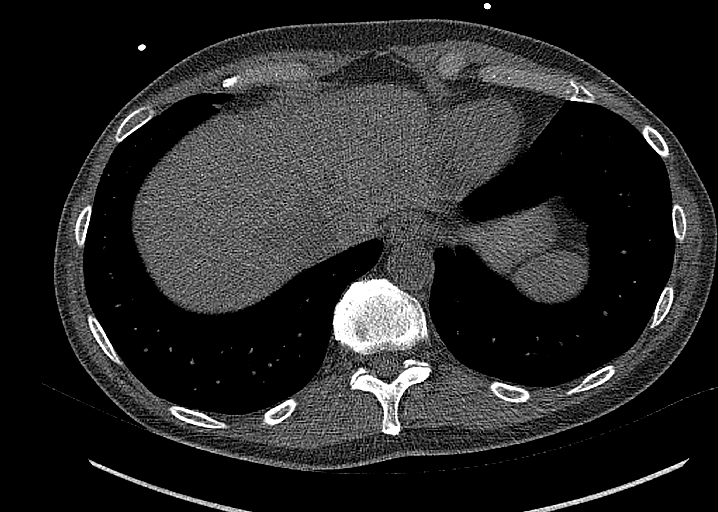
[im 30/90  vessel]
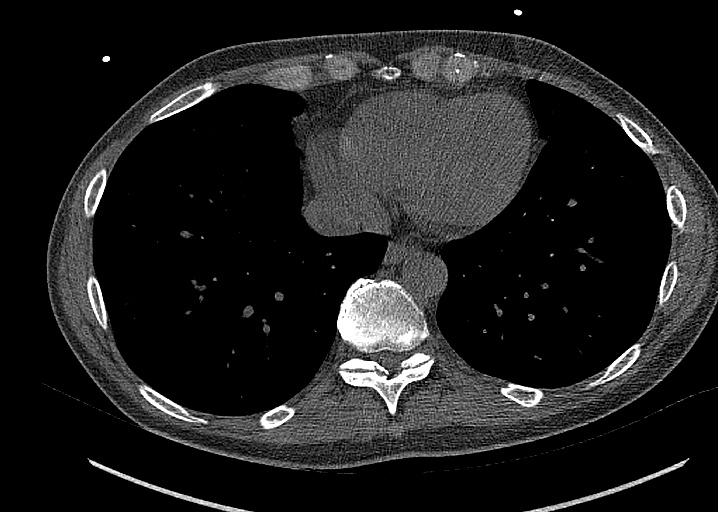
[im 45/90  vessel]
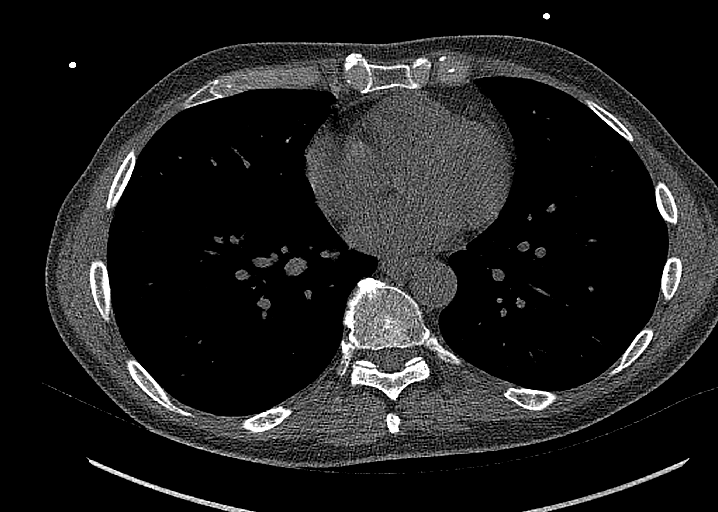
[im 60/90  vessel]
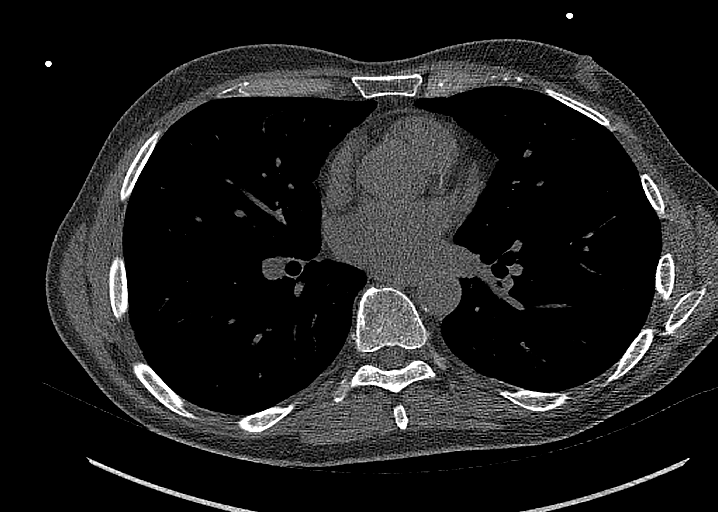
[im 75/90  vessel]
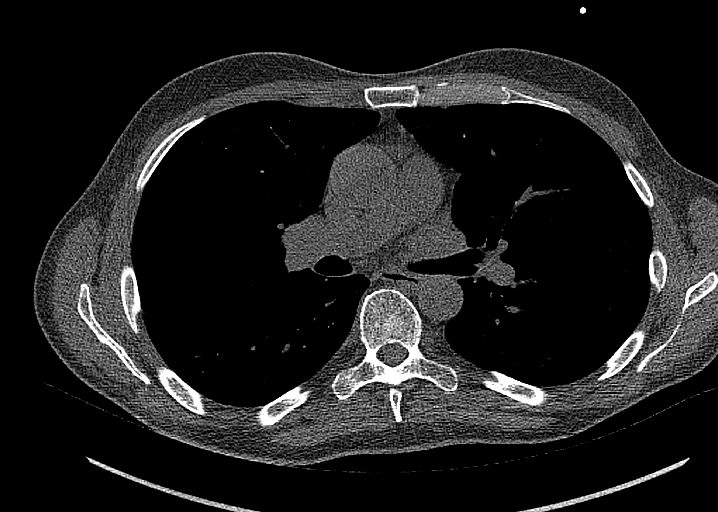

[13 of 20 positions shown; findings below may reference images not displayed]

FINDINGS: Technical quality: Good

CORONARY CALCIUM SCORES:

Left Main: No coronary artery calcification

LAD: 57

LCx: No coronary calcification

RCA: No coronary calcification

CORONARY CALCIUM

Total Agatston Score: 57

[HOSPITAL] percentile: 59

Ascending aorta (normal <  40 mm): 31 mm

EXTRACARDIAC FINDINGS:

Limited view of the lung parenchyma demonstrates no suspicious
nodularity. Airways are normal.

Limited view of the mediastinum demonstrates no adenopathy.
Esophagus normal.

Limited view of the upper abdomen unremarkable.

Limited view of the skeleton and chest wall is unremarkable.
IMPRESSION: 1. LAD coronary artery calcification.

2. Total Agatston Score: 57

3. MESA age and sex matched database percentile: 59

## 2022-05-23 DIAGNOSIS — M545 Low back pain, unspecified: Secondary | ICD-10-CM | POA: Diagnosis not present

## 2022-05-23 DIAGNOSIS — I10 Essential (primary) hypertension: Secondary | ICD-10-CM | POA: Diagnosis not present

## 2022-05-23 DIAGNOSIS — M25552 Pain in left hip: Secondary | ICD-10-CM | POA: Diagnosis not present

## 2022-05-25 ENCOUNTER — Other Ambulatory Visit: Payer: Self-pay | Admitting: Oncology

## 2022-05-25 ENCOUNTER — Ambulatory Visit: Admit: 2022-05-25 | Payer: BLUE CROSS/BLUE SHIELD | Attending: Family | Primary: Internal Medicine

## 2022-05-25 ENCOUNTER — Encounter: Admit: 2022-05-25 | Payer: PRIVATE HEALTH INSURANCE | Attending: Family | Primary: Internal Medicine

## 2022-05-29 ENCOUNTER — Encounter: Admit: 2022-05-29 | Payer: PRIVATE HEALTH INSURANCE | Attending: Family | Primary: Internal Medicine

## 2022-05-31 ENCOUNTER — Encounter: Admit: 2022-05-31 | Payer: PRIVATE HEALTH INSURANCE | Attending: Internal Medicine | Primary: Internal Medicine

## 2022-06-04 ENCOUNTER — Ambulatory Visit: Admit: 2022-06-04 | Payer: BLUE CROSS/BLUE SHIELD | Attending: Urology | Primary: Internal Medicine

## 2022-06-09 ENCOUNTER — Encounter: Admit: 2022-06-09 | Payer: PRIVATE HEALTH INSURANCE | Attending: Internal Medicine | Primary: Internal Medicine

## 2022-06-12 ENCOUNTER — Ambulatory Visit: Admit: 2022-06-12 | Payer: BLUE CROSS/BLUE SHIELD | Attending: Urology | Primary: Internal Medicine

## 2022-06-12 ENCOUNTER — Encounter: Admit: 2022-06-12 | Payer: PRIVATE HEALTH INSURANCE | Primary: Internal Medicine

## 2022-06-27 DIAGNOSIS — Z1211 Encounter for screening for malignant neoplasm of colon: Secondary | ICD-10-CM | POA: Diagnosis not present

## 2022-07-05 ENCOUNTER — Encounter
Admit: 2022-07-05 | Payer: PRIVATE HEALTH INSURANCE | Attending: Vascular and Interventional Radiology | Primary: Internal Medicine

## 2022-07-06 ENCOUNTER — Encounter: Admit: 2022-07-06 | Payer: PRIVATE HEALTH INSURANCE | Primary: Internal Medicine

## 2022-07-06 ENCOUNTER — Ambulatory Visit: Admit: 2022-07-06 | Payer: BLUE CROSS/BLUE SHIELD | Primary: Internal Medicine

## 2022-07-06 LAB — COLOGUARD: COLOGUARD: NEGATIVE

## 2022-07-09 ENCOUNTER — Encounter
Admit: 2022-07-09 | Payer: PRIVATE HEALTH INSURANCE | Attending: Vascular and Interventional Radiology | Primary: Internal Medicine

## 2022-07-11 ENCOUNTER — Encounter: Admit: 2022-07-11 | Payer: PRIVATE HEALTH INSURANCE | Attending: Family | Primary: Internal Medicine

## 2022-08-07 ENCOUNTER — Ambulatory Visit: Payer: BC Managed Care – PPO | Admitting: Oncology

## 2022-08-07 ENCOUNTER — Inpatient Hospital Stay: Payer: BC Managed Care – PPO | Attending: Oncology

## 2022-08-07 DIAGNOSIS — R7989 Other specified abnormal findings of blood chemistry: Secondary | ICD-10-CM | POA: Diagnosis not present

## 2022-08-07 DIAGNOSIS — R011 Cardiac murmur, unspecified: Secondary | ICD-10-CM | POA: Diagnosis not present

## 2022-08-07 DIAGNOSIS — N189 Chronic kidney disease, unspecified: Secondary | ICD-10-CM | POA: Insufficient documentation

## 2022-08-07 DIAGNOSIS — Z79899 Other long term (current) drug therapy: Secondary | ICD-10-CM | POA: Diagnosis not present

## 2022-08-07 DIAGNOSIS — I129 Hypertensive chronic kidney disease with stage 1 through stage 4 chronic kidney disease, or unspecified chronic kidney disease: Secondary | ICD-10-CM | POA: Diagnosis not present

## 2022-08-07 DIAGNOSIS — C9211 Chronic myeloid leukemia, BCR/ABL-positive, in remission: Secondary | ICD-10-CM | POA: Insufficient documentation

## 2022-08-07 LAB — CBC WITH DIFFERENTIAL (CANCER CENTER ONLY)
Abs Immature Granulocytes: 0.01 10*3/uL (ref 0.00–0.07)
Basophils Absolute: 0.1 10*3/uL (ref 0.0–0.1)
Basophils Relative: 1 %
Eosinophils Absolute: 1 10*3/uL — ABNORMAL HIGH (ref 0.0–0.5)
Eosinophils Relative: 18 %
HCT: 35.7 % — ABNORMAL LOW (ref 39.0–52.0)
Hemoglobin: 11.8 g/dL — ABNORMAL LOW (ref 13.0–17.0)
Immature Granulocytes: 0 %
Lymphocytes Relative: 26 %
Lymphs Abs: 1.5 10*3/uL (ref 0.7–4.0)
MCH: 29.7 pg (ref 26.0–34.0)
MCHC: 33.1 g/dL (ref 30.0–36.0)
MCV: 89.9 fL (ref 80.0–100.0)
Monocytes Absolute: 0.6 10*3/uL (ref 0.1–1.0)
Monocytes Relative: 11 %
Neutro Abs: 2.5 10*3/uL (ref 1.7–7.7)
Neutrophils Relative %: 44 %
Platelet Count: 147 10*3/uL — ABNORMAL LOW (ref 150–400)
RBC: 3.97 MIL/uL — ABNORMAL LOW (ref 4.22–5.81)
RDW: 13.5 % (ref 11.5–15.5)
WBC Count: 5.7 10*3/uL (ref 4.0–10.5)
nRBC: 0 % (ref 0.0–0.2)

## 2022-08-07 LAB — CMP (CANCER CENTER ONLY)
ALT: 43 U/L (ref 0–44)
AST: 38 U/L (ref 15–41)
Albumin: 4.2 g/dL (ref 3.5–5.0)
Alkaline Phosphatase: 70 U/L (ref 38–126)
Anion gap: 5 (ref 5–15)
BUN: 23 mg/dL (ref 8–23)
CO2: 30 mmol/L (ref 22–32)
Calcium: 9.4 mg/dL (ref 8.9–10.3)
Chloride: 105 mmol/L (ref 98–111)
Creatinine: 1.61 mg/dL — ABNORMAL HIGH (ref 0.61–1.24)
GFR, Estimated: 48 mL/min — ABNORMAL LOW (ref >60)
Glucose, Bld: 67 mg/dL — ABNORMAL LOW (ref 70–99)
Potassium: 4.3 mmol/L (ref 3.5–5.1)
Sodium: 140 mmol/L (ref 135–145)
Total Bilirubin: 0.6 mg/dL (ref 0.3–1.2)
Total Protein: 6.4 g/dL — ABNORMAL LOW (ref 6.5–8.1)

## 2022-08-15 ENCOUNTER — Encounter: Admit: 2022-08-15 | Payer: PRIVATE HEALTH INSURANCE | Attending: Family | Primary: Internal Medicine

## 2022-08-20 ENCOUNTER — Encounter: Admit: 2022-08-20 | Payer: PRIVATE HEALTH INSURANCE | Attending: Family | Primary: Internal Medicine

## 2022-08-20 ENCOUNTER — Ambulatory Visit: Admit: 2022-08-20 | Payer: BLUE CROSS/BLUE SHIELD | Attending: Family | Primary: Internal Medicine

## 2022-08-20 ENCOUNTER — Inpatient Hospital Stay: Admit: 2022-08-20 | Discharge: 2022-08-20 | Payer: BLUE CROSS/BLUE SHIELD | Primary: Internal Medicine

## 2022-08-20 DIAGNOSIS — R200 Anesthesia of skin: Secondary | ICD-10-CM

## 2022-08-20 LAB — BCR/ABL

## 2022-08-21 ENCOUNTER — Inpatient Hospital Stay: Admit: 2022-08-21 | Discharge: 2022-08-21 | Payer: BLUE CROSS/BLUE SHIELD | Primary: Internal Medicine

## 2022-08-21 ENCOUNTER — Telehealth: Admit: 2022-08-21 | Payer: PRIVATE HEALTH INSURANCE | Attending: Family | Primary: Internal Medicine

## 2022-08-21 DIAGNOSIS — R200 Anesthesia of skin: Secondary | ICD-10-CM

## 2022-08-21 LAB — LYME ANTIBODIES W/RFLX TO CONFIRM (MODIFIED TWO-TIER TESTING)    (BH GH LMW YH)
BKR LYME TOTAL ANTIBODY INTERPRETATION: NEGATIVE
BKR LYME TOTAL ANTIBODY LI: 0.74 LI (ref <=0.90)

## 2022-08-21 LAB — RHEUMATOID FACTOR: BKR RHEUMATOID FACTOR: <10 IU/mL (ref <14.0)

## 2022-08-21 LAB — ANA IFA SCREEN W/REFL TO TITER AND PATTERN, IFA: BKR ANTI-NUCLEAR ANTIBODY (ANA): <1:80 {titer}

## 2022-08-21 LAB — SEDIMENTATION RATE (ESR): BKR SEDIMENTATION RATE, ERYTHROCYTE: 13 mm/hr (ref 0–20)

## 2022-08-21 LAB — C-REACTIVE PROTEIN     (CRP): BKR C-REACTIVE PROTEIN, HIGH SENSITIVITY: 1.5 mg/L

## 2022-08-21 LAB — VITAMIN B12: BKR VITAMIN B12: 906 pg/mL (ref 232–1245)

## 2022-08-22 ENCOUNTER — Ambulatory Visit: Admit: 2022-08-22 | Payer: BLUE CROSS/BLUE SHIELD | Attending: "Endocrinology | Primary: Internal Medicine

## 2022-08-22 ENCOUNTER — Telehealth: Admit: 2022-08-22 | Payer: PRIVATE HEALTH INSURANCE | Attending: Family | Primary: Internal Medicine

## 2022-08-23 ENCOUNTER — Inpatient Hospital Stay: Payer: BC Managed Care – PPO | Admitting: Oncology

## 2022-08-23 ENCOUNTER — Telehealth: Payer: Self-pay | Admitting: *Deleted

## 2022-08-23 VITALS — BP 132/82 | HR 85 | Temp 98.1°F | Resp 20 | Ht 68.0 in | Wt 156.2 lb

## 2022-08-23 DIAGNOSIS — C9211 Chronic myeloid leukemia, BCR/ABL-positive, in remission: Secondary | ICD-10-CM

## 2022-08-23 DIAGNOSIS — R7989 Other specified abnormal findings of blood chemistry: Secondary | ICD-10-CM | POA: Diagnosis not present

## 2022-08-23 DIAGNOSIS — R011 Cardiac murmur, unspecified: Secondary | ICD-10-CM | POA: Diagnosis not present

## 2022-08-23 DIAGNOSIS — Z79899 Other long term (current) drug therapy: Secondary | ICD-10-CM | POA: Diagnosis not present

## 2022-08-23 DIAGNOSIS — N189 Chronic kidney disease, unspecified: Secondary | ICD-10-CM | POA: Diagnosis not present

## 2022-08-23 DIAGNOSIS — I129 Hypertensive chronic kidney disease with stage 1 through stage 4 chronic kidney disease, or unspecified chronic kidney disease: Secondary | ICD-10-CM | POA: Diagnosis not present

## 2022-08-23 NOTE — Telephone Encounter (Signed)
Faxed office note and EKG report to Dr. Laurey Morale office [phone] and followed w/call. Left CMA message that fax was sent showing atrial fibrillation and that Dr. Truett Perna did not initiate treatment. Requested return call to confirm receipt.

## 2022-08-23 NOTE — Progress Notes (Signed)
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  He continues Gleevec.  He has noted a rash at the left shoulder for the past few weeks.  He reports intermittent pain at the lower back and left leg.  This has been present for several months.  He also notes mild discomfort at the left scrotum.  He exercises by walking in his apartment.  He notes that his pulse has been higher recently when checking his blood pressure at home.  His pulse has been in the 80-90 range.  Objective:  Vital signs in last 24 hours:  Blood pressure 132/82, pulse 85, temperature 98.1 F (36.7 C), temperature source Oral, resp. rate 20, height  (1.727 m), weight 156 lb 3.2 oz (70.9 kg), SpO2 100 %.    HEENT: No thrush or ulcers Resp: Lungs clear bilaterally Cardio: Irregular GI: No hepatosplenomegaly Vascular: No leg edema  Skin: Mild fine scab rash at the left shoulder with a similar rash at the lower aspect of the posterior neck.  No vesicles.   Lab Results:  Lab Results  Component Value Date   WBC 5.7 08/07/2022   HGB 11.8 (L) 08/07/2022   HCT 35.7 (L) 08/07/2022   MCV 89.9 08/07/2022   PLT 147 (L) 08/07/2022   NEUTROABS 2.5 08/07/2022    CMP  Lab Results  Component Value Date   NA 140 08/07/2022   K 4.3 08/07/2022   CL 105 08/07/2022   CO2 30 08/07/2022   GLUCOSE 67 (L) 08/07/2022   BUN 23 08/07/2022   CREATININE 1.61 (H) 08/07/2022   CALCIUM 9.4 08/07/2022   PROT 6.4 (L) 08/07/2022   ALBUMIN 4.2 08/07/2022   AST 38 08/07/2022   ALT 43 08/07/2022   ALKPHOS 70 08/07/2022   BILITOT 0.6 08/07/2022   GFRNONAA 48 (L) 08/07/2022   GFRAA 51 (L) 11/09/2019    Lab Results  Component Value Date   CEA <1.00 01/29/2022   EKG-atrial fibrillation Medications: I have reviewed the patient's current medications.   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 Gleevec  discontinued April 2017, resumed in June 2017 after a rise in the peripheral blood BCR-ABL Gleevec resumed and he again entered molecular remission Peripheral blood PCR detectable 11/09/2019, not detected 02/09/2020 Gleevec decreased to 200 mg daily beginning 02/24/2020 secondary to anemia/thrombocytopenia Peripheral blood PCR 09/20/2020-detected, below limit of reporting Peripheral blood FISH 01/23/2021-negative Peripheral blood PCR 07/17/2021-0.01% Peripheral blood PCR 10/30/2021-detected below the level of measurement Peripheral blood PCR 01/29/2022-not detected Peripheral blood PCR 08/07/2022-0.0064% Hypertension Chronic renal insufficiency Chronic eosinophilia Systolic heart murmur noted on exam 11/13/2018 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 Nijjar remains in hematologic and major molecular remission from [facility].  He will continue Gleevec at the current dose.  He will return for a lab visit in 3 months and an office visit in 6 months.  The rash at the left shoulder may be related to Gleevec.  I have a low suspicion for a zoster infection.  The rash appears to be resolving.  He reported left "scrotum "discomfort, but declined examination of the scrotum today.  He has new onset atrial fibrillation.  We will contact Dr. Waynard Edwards with the EKG finding.  I explained Dr.Perini may recommend anticoagulation therapy and a cardiology referral.  Thornton Papas, MD  08/23/2022  10:51 AM

## 2022-08-24 ENCOUNTER — Ambulatory Visit (HOSPITAL_COMMUNITY)
Admission: RE | Admit: 2022-08-24 | Discharge: 2022-08-24 | Disposition: A | Discharge status: Home / Self Care | Payer: BC Managed Care – PPO | Source: Ambulatory Visit | Attending: Physician Assistant | Admitting: Physician Assistant

## 2022-08-24 VITALS — BP 138/78 | HR 88 | Ht 68.0 in | Wt 156.8 lb

## 2022-08-24 DIAGNOSIS — N189 Chronic kidney disease, unspecified: Secondary | ICD-10-CM | POA: Diagnosis not present

## 2022-08-24 DIAGNOSIS — Z7901 Long term (current) use of anticoagulants: Secondary | ICD-10-CM | POA: Insufficient documentation

## 2022-08-24 DIAGNOSIS — I129 Hypertensive chronic kidney disease with stage 1 through stage 4 chronic kidney disease, or unspecified chronic kidney disease: Secondary | ICD-10-CM | POA: Insufficient documentation

## 2022-08-24 DIAGNOSIS — C9211 Chronic myeloid leukemia, BCR/ABL-positive, in remission: Secondary | ICD-10-CM | POA: Diagnosis not present

## 2022-08-24 DIAGNOSIS — I4819 Other persistent atrial fibrillation: Secondary | ICD-10-CM | POA: Diagnosis not present

## 2022-08-24 DIAGNOSIS — I2510 Atherosclerotic heart disease of native coronary artery without angina pectoris: Secondary | ICD-10-CM | POA: Insufficient documentation

## 2022-08-24 DIAGNOSIS — D6869 Other thrombophilia: Secondary | ICD-10-CM | POA: Diagnosis not present

## 2022-08-24 MED ORDER — APIXABAN 5 MG PO TABS: 5.0000 mg | ORAL_TABLET | Freq: Two times a day (BID) | ORAL | 6 refills | Status: AC

## 2022-08-24 NOTE — Progress Notes (Signed)
Primary Care Physician: Rodrigo Ran, MD Primary Cardiologist: none Primary Electrophysiologist: none Referring Physician: Dr Patrick Nash is a 62 y.o. male with a history of CML, CAD, CKD, HTN, atrial fibrillation who presents for consultation in the [facility].  The patient was initially diagnosed with atrial fibrillation 08/23/22 after presenting for routine follow up with his oncologist. Patient had noted that his pulse had been a little higher than usual on his home BP machine. And ECG was done which showed rate controlled afib. Patient has a CHADS2VASC score of 2. He did have a calcium score of 57 in 2021 on CT.   Today, patient remains in rate controlled afib. Unclear duration as he is unaware of his arrhythmia. He denies alcohol use.   Today, he denies symptoms of palpitations, chest pain, shortness of breath, orthopnea, PND, lower extremity edema, dizziness, presyncope, syncope, snoring, daytime somnolence, bleeding, or neurologic sequela. The patient is tolerating medications without difficulties and is otherwise without complaint today.    Atrial Fibrillation Risk Factors:  he does not have symptoms or diagnosis of sleep apnea. he does not have a history of rheumatic fever. he does not have a history of alcohol use. The patient does have a history of early familial atrial fibrillation or other arrhythmias. Father has afib.   he has a BMI of Body mass index is 23.84 kg/m.Marland Kitchen Filed Weights   08/24/22 1135  Weight: 71.1 kg    No family history on file.   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  Current Outpatient Medications  Medication Sig Dispense Refill   acetaminophen (TYLENOL) 500 MG tablet Take 1,000 mg by mouth every 6 (six) hours as needed for moderate pain.     amLODipine (NORVASC) 2.5 MG tablet Take 2.5 mg by mouth daily.     imatinib (GLEEVEC) 400 MG tablet TAKE A HALF TABLET (200 MG) BY MOUTH ONCE DAILY WITH A MEAL AND LARGE GLASS OF WATER 15 tablet 3   loratadine (CLARITIN) 10 MG tablet Take 10 mg by mouth daily as needed.     Multiple Vitamin (MULTIVITAMIN) capsule Take 1 capsule by mouth daily.     rosuvastatin (CRESTOR) 10 MG tablet Take 10 mg by mouth daily.     vitamin B-12 (CYANOCOBALAMIN) 1000 MCG tablet Take 1 tablet (1,000 mcg total) by mouth daily.     No current facility-administered medications for this encounter.    No Known Allergies  Social History   Socioeconomic History   Marital status: Single    Spouse name: Not on file   Number of children: Not on file   Years of education: Not on file   Highest education level: Not on file  Occupational History   Not on file  Tobacco Use   Smoking status: Never   Smokeless tobacco: Never  Substance and Sexual Activity   Alcohol use: No  Alcohol/week: 0.0 standard drinks of alcohol   Drug use: No   Sexual activity: Not on file  Other Topics Concern   Not on file  Social History Narrative   Not on file   Social Determinants of Health   Financial Resource Strain: Not on file  Food Insecurity: Not on file  Transportation Needs: Not on file  Physical Activity: Not on file  Stress: Not on file  Social Connections: Not on file  Intimate Partner Violence: Not on file     ROS- All systems are reviewed and negative except as per the HPI  above.  Physical Exam: Vitals:   08/24/22 1135  BP: 138/78  Pulse: 88  Weight: 71.1 kg  Height: 5\' 8"  (1.727 m)    GEN- The patient is a well appearing male, alert and oriented x 3 today.   Head- normocephalic, atraumatic Eyes-  Sclera clear, conjunctiva pink Ears- hearing intact Oropharynx- clear Neck- supple  Lungs- Clear to ausculation bilaterally, normal work of breathing Heart- irregular rate and rhythm, no murmurs, rubs or gallops  GI- soft, NT, ND, + BS Extremities- no clubbing, cyanosis, or edema MS- no significant deformity or atrophy Skin- no rash or lesion Psych- euthymic mood, full affect Neuro- strength and sensation are intact  Wt Readings from Last 3 Encounters:  08/24/22 71.1 kg  08/23/22 70.9 kg  02/05/22 70.7 kg    EKG today demonstrates  Afib Vent. rate 88 BPM PR interval * ms QRS duration 80 ms QT/QTcB 332/401 ms  Echo 01/27/19 demonstrated  1. Left ventricular ejection fraction, by visual estimation, is 60 to  65%. The left ventricle has normal function. Normal left ventricular size.  There is no left ventricular hypertrophy.   2. Global right ventricle has normal systolic function.The right  ventricular size is normal. No increase in right ventricular wall  thickness.   3. Left atrial size was normal.   4. Right atrial size was normal.   5. The mitral valve is normal in structure. No evidence of mitral valve  regurgitation. No evidence of mitral stenosis.   6. The tricuspid valve is normal in structure. Tricuspid valve  regurgitation was not visualized by color flow Doppler.   7. The aortic valve is normal in structure. Aortic valve regurgitation  was not visualized by color flow Doppler. Structurally normal aortic  valve, with no evidence of sclerosis or stenosis.   8. The pulmonic valve was normal in structure. Pulmonic valve  regurgitation is not visualized by color flow Doppler.   9. The inferior vena cava is normal in size with  greater than 50%  respiratory variability, suggesting right atrial pressure of 3 mmHg.   Epic records are reviewed at length today.  CHA2DS2-VASc Score = 2  The patient's score is based upon: CHF History: 0 HTN History: 1 Diabetes History: 0 Stroke History: 0 Vascular Disease History: 1 Age Score: 0 Gender Score: 0       ASSESSMENT AND PLAN: 1. Persistent Atrial Fibrillation (ICD10:  I48.19) The patient's CHA2DS2-VASc score is 2, indicating a 2.2% annual risk of stroke.   General education about afib provided and questions answered. We also discussed his stroke risk and the risks and benefits of anticoagulation. Will start Eliquis 5 mg BID. We discussed rhythm control options. Will plan for DCCV after 3 weeks of uninterrupted anticoagulation.  Will not start AV nodal agent at this time given his adequate rate control.  If he has quick return of his afib, we did  discuss AAD or ablation for rhythm control.   2. Secondary Hypercoagulable State (ICD10:  D68.69) The patient is at significant risk for stroke/thromboembolism based upon his CHA2DS2-VASc Score of 2.  Start Apixaban (Eliquis).   3. CML Followed by Dr Truett Perna  4. HTN Stable, no changes today.  5. CAD CAC score 57, mild disease On statin   Follow up in the AF clinic post DCCV.    Jorja Loa PA-C [facility] [address] [place], [place] [address] [phone] 08/24/2022 12:12 PM

## 2022-08-24 NOTE — Patient Instructions (Signed)
Start Eliquis 5 mg -Taking 1 tablet by mouth twice daily  Cardioversion scheduled for: May 20th 2024   - Arrive at the [facility] and go to admitting at 9:30am   - Do not eat or drink anything after midnight the night prior to your procedure.   - Take all your morning medication (except diabetic medications) with a sip of water prior to arrival.  - You will not be able to drive home after your procedure.    - Do NOT miss any doses of your blood thinner - if you should miss a dose please notify our office immediately.   - If you feel as if you go back into normal rhythm prior to scheduled cardioversion, please notify our office immediately.   If your procedure is canceled in the cardioversion suite you will be charged a cancellation fee.

## 2022-08-27 DIAGNOSIS — D631 Anemia in chronic kidney disease: Secondary | ICD-10-CM | POA: Diagnosis not present

## 2022-08-27 DIAGNOSIS — N183 Chronic kidney disease, stage 3 unspecified: Secondary | ICD-10-CM | POA: Diagnosis not present

## 2022-08-27 DIAGNOSIS — N2581 Secondary hyperparathyroidism of renal origin: Secondary | ICD-10-CM | POA: Diagnosis not present

## 2022-08-27 DIAGNOSIS — I129 Hypertensive chronic kidney disease with stage 1 through stage 4 chronic kidney disease, or unspecified chronic kidney disease: Secondary | ICD-10-CM | POA: Diagnosis not present

## 2022-08-28 ENCOUNTER — Encounter: Admit: 2022-08-28 | Payer: PRIVATE HEALTH INSURANCE | Attending: Family | Primary: Internal Medicine

## 2022-08-28 ENCOUNTER — Encounter: Admit: 2022-08-28 | Payer: PRIVATE HEALTH INSURANCE | Attending: Urology | Primary: Internal Medicine

## 2022-08-28 ENCOUNTER — Ambulatory Visit: Admit: 2022-08-28 | Payer: BLUE CROSS/BLUE SHIELD | Attending: Family | Primary: Internal Medicine

## 2022-08-28 ENCOUNTER — Ambulatory Visit: Admit: 2022-08-28 | Payer: BLUE CROSS/BLUE SHIELD | Attending: Urology | Primary: Internal Medicine

## 2022-08-28 DIAGNOSIS — M1990 Unspecified osteoarthritis, unspecified site: Secondary | ICD-10-CM

## 2022-08-28 DIAGNOSIS — N528 Other male erectile dysfunction: Secondary | ICD-10-CM

## 2022-08-28 DIAGNOSIS — Z83719 Family history of colonic polyps: Secondary | ICD-10-CM

## 2022-08-28 DIAGNOSIS — Z125 Encounter for screening for malignant neoplasm of prostate: Secondary | ICD-10-CM

## 2022-08-28 DIAGNOSIS — C8590 Non-Hodgkin lymphoma, unspecified, unspecified site: Secondary | ICD-10-CM

## 2022-08-28 DIAGNOSIS — N400 Enlarged prostate without lower urinary tract symptoms: Secondary | ICD-10-CM

## 2022-08-28 MED ORDER — TADALAFIL 20 MG TABLET
20 mg | ORAL_TABLET | Freq: Every day | ORAL | 12 refills | Status: AC | PRN
Start: 2022-08-28 — End: ?

## 2022-08-28 MED ORDER — TADALAFIL 5 MG TABLET
5 mg | ORAL_TABLET | Freq: Every day | ORAL | 4 refills | Status: AC
Start: 2022-08-28 — End: ?

## 2022-08-28 NOTE — Progress Notes
HPI    Patrick Casey is a 62 y.o. male who presents with a chief complaint of ED, BPH and a family hx of prostate cancer. The patient has erectile dysfunction.  This has been an issue for several years.  He has taken Cialis 20 mg daily prn with a moderate affect.  He has been taking Cialis 5 milligrams daily to treat erectile dysfunction as well as urinary symptoms related to BPH.  He feels some improvement while taking daily Cialis 5 mg and has noted improvement in his nocturia down to 2x from 3x.  Urinary flow remains moderate.He was diagnosed with gynecomastia in November and has an appointment with endocrinologist scheduled and is considering treatment with tamoxifen and is considering surgery.His father had prostate cancer, died from metastatic disease.Latest PSA was 0.648 on 09/30/2021. 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   [place] ABDOMEN PELVIS W IV CONTRAST 05/25/2022HISTORY: Sharp nonradiating left-sided flank pain since yesterday morning. Worse with movement and improved with rest. No dysuria, hematuria, trauma, fevers, vomiting, or diarrhea. Benign examinationCOMPARISON: [place] September 14, 2005TECHNIQUE: [place] images of the abdomen and pelvis were obtained from the diaphragms to the pubic symphysis after the administration of Tc intravenous contrast (Omnipaque 350).   FINDINGS:LUNG BASES: Stable scattered cystic changes are noted throughout the lung bases.  LIVER: Scattered hepatic cysts. Otherwise unremarkable.GALLBLADDER: Unremarkable.SPLEEN: Unremarkable.PANCREAS: Unremarkable.ADRENALS: Unremarkable.KIDNEYS: Unremarkable. BOWEL: There is moderate pancolonic stool burden.APPENDIX: The appendix is normal.PERITONEUM: No intraperitoneal free fluid. No intraperitoneal free air.  LYMPH NODES: Unremarkable.VESSELS: There is mild calcified atherosclerotic disease without aneurysmal dilatation. URINARY BLADDER: Slight wall thickening of the urinary bladder.PELVIS: Unremarkable. BONES & SOFT TISSUE: Unremarkable.  IMPRESSION:Moderate pancolonic stool burden with no acute abnormality in the abdomen or pelvis. Stable chronic pulmonary cystic changes. Subtle thickening of the urinary bladder. This could be secondary to slight underdistention, although should be correlated with urinalysis. Recent PSA Results:  Latest Ref Rng & Units 09/30/2021 08/27/2020 06/17/2019 08/08/2017 11/02/2015 PSA RESULTS PSA 0.000 - 3.900 ng/mL 0.648  0.440  0.719  0.461  0.6       This result is from an external source.  Past Medical HistoryPast Medical History: Diagnosis Date  Arthritis   NHL (non-Hodgkin's lymphoma) (HC Code)  Past Surgical HistoryPast Surgical History: Procedure Laterality Date  fractured skull    age 62  HERNIA REPAIR    KNEE ARTHROSCOPY    left knee cartiledge repair    right hip repair    right knee cyst removal    right shoulder repair    SHOULDER SURGERY Left  AllergiesNo Known AllergiesMedicationsOutpatient Encounter Medications as of 08/28/2022 Medication Sig Dispense Refill  calcium carbonate 1250 mg (500 mg calcium) - Vitamin D3 200 Unit tablet Take 1 tablet by mouth 2 (two) times daily (0800, 1800).    doxylamine succinate (UNISOM, DOXYLAMINE,) 25 mg tablet Take 1 tablet (25 mg total) by mouth nightly as needed for sleep.    glucosamine-chondroitin 500-400 mg tablet Take 1 tablet by mouth 3 (three) times daily.    multivitamin (MULTIPLE VITAMIN ORAL) Take 1 tablet by mouth daily.    [DISCONTINUED] tadalafiL (CIALIS) 20 mg tablet Take 1 tablet (20 mg total) by mouth daily as needed for erectile dysfunction. 90 tablet 3  [DISCONTINUED] tadalafiL (CIALIS) 5 mg tablet Take 1 tablet (5 mg total) by mouth daily. 90 tablet 3  tadalafiL (CIALIS) 20 mg tablet Take 1 tablet (20 mg total) by  mouth daily as needed for erectile dysfunction. 30 tablet 11  tadalafiL (CIALIS) 5 mg tablet Take 1 tablet (5 mg total) by mouth daily. 90 tablet 3 No facility-administered encounter medications on file as of 08/28/2022.  Social HistorySocial History Tobacco Use  Smoking status: Never  Smokeless tobacco: Never Vaping Use  Vaping Use: Never used Substance Use Topics  Alcohol use: Yes   Alcohol/week: 4.0 standard drinks of alcohol   Types: 4 Cans of beer per week  Drug use: No  Family History Family History Problem Relation Age of Onset  Dementia Mother   Alcohol abuse Mother   Breast cancer Mother   Alcohol abuse Father   Prostate cancer Father   Parkinsonism Father   No Known Problems Brother   No Known Problems Brother   No Known Problems Daughter   No Known Problems Daughter   No Known Problems Son   Diabetes Son       type I Review of SystemsConstitutional: negative for weight changeCardiovascular: negative for chest painRespiratory: negative for cough or SOBAll other systems reviewed and are negative Physical ExamHt 5' 8.5  - Wt 173 lb  - BMI 25.92 kg/m?  Constitutional:  Well developed, no acute distress  EENT: Sclera normal, normal nares and mucosaRespiratory:  Normal respiratory effortCardiovascular: No evidence of extremity swellingSkin: warm and dryMusculoskeletal: Gait appears normal  Neurological: well oriented to time, place, and person  Prostate: ~20 cc, no tumors, normal consistency, nontender, symmetric  Lab Results Component Value Date  UCOLOR yellow 06/09/2019  UGLUCOSE Negative 06/09/2019  UKETONE Negative 06/09/2019  USPECGRAVITY 1.015 06/09/2019  UPROTEIN Negative 06/09/2019  UNITRATES Negative 06/09/2019  UBLOOD Negative 06/09/2019  ULEUKOCYTES Negative 06/09/2019   Assessment:Patrick Casey is a 62 y.o. male with BPH and ED, family hx of CaPPlan:DiscussionBPH:Continue Cialis 5 milligrams daily to treat both BPH symptoms -Refill providedED:He can take Cialis 20 mg on a day when he is sexually active, and he will not take the 5 mg tablet that day or the following day.Prostate cancer screening:Family history of prostate cancer, father died from metastatic diseaseObtain PSA in 09/2022 - avoid sexually activity 2 to 3 days prior to PSA testFollow up in 1 yearOrders Placed This Encounter Procedures  PSA, total (screening) ([facility]) Return in about 1 year (around 08/28/2023).Patient will let us know if any new problems or symptoms arise in the interim.Pamella Pert, MDYale [phone] for Pamella Pert, MD by Edwina Barth, medical scribe April 30, 2024The documentation recorded by the scribe accurately reflects the services I personally performed and the decisions made by me. I reviewed and confirmed all material entered and/or pre-charted by the scribe.

## 2022-08-29 DIAGNOSIS — N62 Hypertrophy of breast: Secondary | ICD-10-CM

## 2022-08-29 DIAGNOSIS — Z8572 Personal history of non-Hodgkin lymphomas: Secondary | ICD-10-CM

## 2022-08-29 DIAGNOSIS — N529 Male erectile dysfunction, unspecified: Secondary | ICD-10-CM

## 2022-08-29 DIAGNOSIS — N401 Enlarged prostate with lower urinary tract symptoms: Secondary | ICD-10-CM

## 2022-09-07 ENCOUNTER — Telehealth: Admit: 2022-09-07 | Payer: PRIVATE HEALTH INSURANCE | Primary: Internal Medicine

## 2022-09-07 ENCOUNTER — Encounter: Admit: 2022-09-07 | Payer: BLUE CROSS/BLUE SHIELD | Attending: Family | Primary: Internal Medicine

## 2022-09-07 ENCOUNTER — Encounter: Admit: 2022-09-07 | Payer: PRIVATE HEALTH INSURANCE | Attending: Family | Primary: Internal Medicine

## 2022-09-12 ENCOUNTER — Ambulatory Visit (HOSPITAL_COMMUNITY)
Admission: RE | Admit: 2022-09-12 | Discharge: 2022-09-12 | Disposition: A | Discharge status: Home / Self Care | Payer: BC Managed Care – PPO | Source: Ambulatory Visit | Attending: Physician Assistant | Admitting: Physician Assistant

## 2022-09-12 DIAGNOSIS — I4819 Other persistent atrial fibrillation: Secondary | ICD-10-CM | POA: Insufficient documentation

## 2022-09-12 LAB — BASIC METABOLIC PANEL
Anion gap: 8 (ref 5–15)
BUN: 18 mg/dL (ref 8–23)
CO2: 26 mmol/L (ref 22–32)
Calcium: 8.8 mg/dL — ABNORMAL LOW (ref 8.9–10.3)
Chloride: 102 mmol/L (ref 98–111)
Creatinine, Ser: 1.53 mg/dL — ABNORMAL HIGH (ref 0.61–1.24)
GFR, Estimated: 51 mL/min — ABNORMAL LOW (ref >60)
Glucose, Bld: 111 mg/dL — ABNORMAL HIGH (ref 70–99)
Potassium: 3.9 mmol/L (ref 3.5–5.1)
Sodium: 136 mmol/L (ref 135–145)

## 2022-09-12 LAB — CBC
HCT: 35.9 % — ABNORMAL LOW (ref 39.0–52.0)
Hemoglobin: 11.5 g/dL — ABNORMAL LOW (ref 13.0–17.0)
MCH: 29.4 pg (ref 26.0–34.0)
MCHC: 32 g/dL (ref 30.0–36.0)
MCV: 91.8 fL (ref 80.0–100.0)
Platelets: 143 10*3/uL — ABNORMAL LOW (ref 150–400)
RBC: 3.91 MIL/uL — ABNORMAL LOW (ref 4.22–5.81)
RDW: 13.5 % (ref 11.5–15.5)
WBC: 6.6 10*3/uL (ref 4.0–10.5)
nRBC: 0 % (ref 0.0–0.2)

## 2022-09-13 ENCOUNTER — Encounter: Admit: 2022-09-13 | Payer: PRIVATE HEALTH INSURANCE | Attending: Internal Medicine | Primary: Internal Medicine

## 2022-09-14 NOTE — Progress Notes (Signed)
Spoke to pt regarding procedure and instructed them to arrive at 0930 on 09/17/22.   Instructed pt to be npo after 0000 on 09/17/22.  Stated that pt needs to have a ride home and a responsible adult stay with them for 24 hours after the procedure.  Instructed pt to take eliquis on the morning of the procedure with a small sip of water.

## 2022-09-17 ENCOUNTER — Encounter (HOSPITAL_COMMUNITY)
Admission: RE | Disposition: A | Discharge status: Home / Self Care | Payer: Self-pay | Source: Home / Self Care | Attending: Cardiology

## 2022-09-17 ENCOUNTER — Ambulatory Visit (HOSPITAL_COMMUNITY): Payer: BC Managed Care – PPO | Admitting: Anesthesiology

## 2022-09-17 ENCOUNTER — Other Ambulatory Visit: Payer: Self-pay

## 2022-09-17 ENCOUNTER — Ambulatory Visit (HOSPITAL_COMMUNITY)
Admission: RE | Admit: 2022-09-17 | Discharge: 2022-09-17 | Disposition: A | Discharge status: Home / Self Care | Payer: BC Managed Care – PPO | Attending: Cardiology | Admitting: Cardiology

## 2022-09-17 ENCOUNTER — Encounter (HOSPITAL_COMMUNITY): Payer: Self-pay | Admitting: Cardiology

## 2022-09-17 ENCOUNTER — Ambulatory Visit: Admit: 2022-09-17 | Payer: BLUE CROSS/BLUE SHIELD | Attending: Internal Medicine | Primary: Internal Medicine

## 2022-09-17 ENCOUNTER — Encounter: Admit: 2022-09-17 | Payer: PRIVATE HEALTH INSURANCE | Attending: Internal Medicine | Primary: Internal Medicine

## 2022-09-17 DIAGNOSIS — I4891 Unspecified atrial fibrillation: Secondary | ICD-10-CM | POA: Diagnosis not present

## 2022-09-17 DIAGNOSIS — Z856 Personal history of leukemia: Secondary | ICD-10-CM | POA: Diagnosis not present

## 2022-09-17 DIAGNOSIS — D6869 Other thrombophilia: Secondary | ICD-10-CM | POA: Diagnosis not present

## 2022-09-17 DIAGNOSIS — I4819 Other persistent atrial fibrillation: Secondary | ICD-10-CM | POA: Insufficient documentation

## 2022-09-17 DIAGNOSIS — N183 Chronic kidney disease, stage 3 unspecified: Secondary | ICD-10-CM | POA: Diagnosis not present

## 2022-09-17 DIAGNOSIS — I129 Hypertensive chronic kidney disease with stage 1 through stage 4 chronic kidney disease, or unspecified chronic kidney disease: Secondary | ICD-10-CM | POA: Diagnosis not present

## 2022-09-17 DIAGNOSIS — I2510 Atherosclerotic heart disease of native coronary artery without angina pectoris: Secondary | ICD-10-CM | POA: Diagnosis not present

## 2022-09-17 DIAGNOSIS — I10 Essential (primary) hypertension: Secondary | ICD-10-CM | POA: Diagnosis not present

## 2022-09-17 DIAGNOSIS — C9211 Chronic myeloid leukemia, BCR/ABL-positive, in remission: Secondary | ICD-10-CM | POA: Diagnosis not present

## 2022-09-17 HISTORY — PX: CARDIOVERSION: SHX1299

## 2022-09-17 SURGERY — CARDIOVERSION
Anesthesia: General

## 2022-09-17 MED ORDER — PROPOFOL 10 MG/ML IV BOLUS
INTRAVENOUS | Status: DC | PRN
  Administered 2022-09-17: 60 mg via INTRAVENOUS

## 2022-09-17 MED ORDER — SODIUM CHLORIDE 0.9 % IV SOLN: INTRAVENOUS | Status: DC

## 2022-09-17 MED ORDER — LIDOCAINE 2% (20 MG/ML) 5 ML SYRINGE
INTRAMUSCULAR | Status: DC | PRN
  Administered 2022-09-17: 60 mg via INTRAVENOUS

## 2022-09-17 SURGICAL SUPPLY — 1 items: ELECT DEFIB PAD ADLT CADENCE (PAD) ×1 IMPLANT

## 2022-09-17 NOTE — H&P (Signed)
ATRIAL FIB NEW PATIENT 08/24/2022 [facility]    [place], [place], [place] [facility] Persistent atrial fibrillation [facility]) +1 more Dx Referred by Rodrigo Ran, MD Reason for Visit   Additional Documentation  Vitals: BP 138/78   Pulse 88   Ht 5\' 8"  (1.727 m)   Wt 71.1 kg   BMI 23.84 kg/m   BSA 1.85 m      More Vitals  Flowsheets: Anthropometrics,   NEWS,   MEWS Score,   Vital Signs,   CHADS2-VASc Score,   Method of Visit  Encounter Info: Billing Info,   History,   Allergies,   Detailed Report   All Notes   Progress Notes by Danice Goltz, PA at 08/24/2022 11:30 AM  Author: Danice Goltz, PA Author Type: Physician Assistant Filed: 08/24/2022 12:48 PM  Note Status: Signed Cosign: Cosign Not Required Date of Service: 08/24/2022 11:30 AM  Editor: Danice Goltz, PA (Physician Assistant)                  Primary Care Physician: Rodrigo Ran, MD Primary Cardiologist: none Primary Electrophysiologist: none Referring Physician: Dr Adria Devon Reyn Toranzo is a 62 y.o. male with a history of CML, CAD, CKD, HTN, atrial fibrillation who presents for consultation in the [facility].  The patient was initially diagnosed with atrial fibrillation 08/23/22 after presenting for routine follow up with his oncologist. Patient had noted that his pulse had been a little higher than usual on his home BP machine. And ECG was done which showed rate controlled afib. Patient has a CHADS2VASC score of 2. He did have a calcium score of 57 in 2021 on CT.    Today, patient remains in rate controlled afib. Unclear duration as he is unaware of his arrhythmia. He denies alcohol use.    Today, he denies symptoms of palpitations, chest pain, shortness of breath, orthopnea, PND, lower extremity edema, dizziness, presyncope, syncope, snoring, daytime somnolence, bleeding, or neurologic sequela. The patient  is tolerating medications without difficulties and is otherwise without complaint today.      Atrial Fibrillation Risk Factors:   he does not have symptoms or diagnosis of sleep apnea. he does not have a history of rheumatic fever. he does not have a history of alcohol use. The patient does have a history of early familial atrial fibrillation or other arrhythmias. Father has afib.    he has a BMI of Body mass index is 23.84 kg/m.Marland Kitchen    Filed Weights    08/24/22 1135  Weight: 71.1 kg      No family history on file.     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         Current Outpatient Medications  Medication Sig Dispense Refill   acetaminophen (TYLENOL) 500 MG  tablet Take 1,000 mg by mouth every 6 (six) hours as needed for moderate pain.       amLODipine (NORVASC) 2.5 MG tablet Take 2.5 mg by mouth daily.       imatinib (GLEEVEC) 400 MG tablet TAKE A HALF TABLET (200 MG) BY MOUTH ONCE DAILY WITH A MEAL AND LARGE GLASS OF WATER 15 tablet 3   loratadine (CLARITIN) 10 MG tablet Take 10 mg by mouth daily as needed.       Multiple Vitamin (MULTIVITAMIN) capsule Take 1 capsule by mouth daily.       rosuvastatin (CRESTOR) 10 MG tablet Take 10 mg by mouth daily.       vitamin B-12 (CYANOCOBALAMIN) 1000 MCG tablet Take 1 tablet (1,000 mcg total) by mouth daily.        No current facility-administered  medications for this encounter.      No Known Allergies   Social History         Socioeconomic History   Marital status: Single      Spouse name: Not on file   Number of children: Not on file   Years of education: Not on file   Highest education level: Not on file  Occupational History   Not on file  Tobacco Use   Smoking status: Never   Smokeless tobacco: Never  Substance and Sexual Activity   Alcohol use: No      Alcohol/week: 0.0 standard drinks of alcohol   Drug use: No   Sexual activity: Not on file  Other Topics Concern   Not on file  Social History Narrative   Not on file    Social Determinants of Health    Financial Resource Strain: Not on file  Food Insecurity: Not on file  Transportation Needs: Not on file  Physical Activity: Not on file  Stress: Not on file  Social Connections: Not on file  Intimate Partner Violence: Not on file        ROS- All systems are reviewed and negative except as per the HPI above.   Physical Exam:    Vitals:    08/24/22 1135  BP: 138/78  Pulse: 88  Weight: 71.1 kg  Height: 5\' 8"  (1.727 m)      GEN- The patient is a well appearing male, alert and oriented x 3 today.   Head- normocephalic, atraumatic Eyes-  Sclera clear, conjunctiva pink Ears- hearing intact Oropharynx- clear Neck- supple  Lungs- Clear to ausculation bilaterally, normal work of breathing Heart- irregular rate and rhythm, no murmurs, rubs or gallops  GI- soft, NT, ND, + BS Extremities- no clubbing, cyanosis, or edema MS- no significant deformity or atrophy Skin- no rash or lesion Psych- euthymic mood, full affect Neuro- strength and sensation are intact      Wt Readings from Last 3 Encounters:  08/24/22 71.1 kg  08/23/22 70.9 kg  02/05/22 70.7 kg      EKG today demonstrates  Afib Vent. rate 88 BPM PR interval * ms QRS duration 80 ms QT/QTcB 332/401 ms   Echo 01/27/19 demonstrated  1. Left ventricular ejection fraction, by visual  estimation, is 60 to  65%. The left ventricle has normal function. Normal left ventricular size.  There is no left ventricular hypertrophy.   2. Global right ventricle has normal systolic function.The right  ventricular size is normal. No increase in right ventricular wall  thickness.   3. Left atrial size was normal.   4. Right atrial size was normal.  5. The mitral valve is normal in structure. No evidence of mitral valve  regurgitation. No evidence of mitral stenosis.   6. The tricuspid valve is normal in structure. Tricuspid valve  regurgitation was not visualized by color flow Doppler.   7. The aortic valve is normal in structure. Aortic valve regurgitation  was not visualized by color flow Doppler. Structurally normal aortic  valve, with no evidence of sclerosis or stenosis.   8. The pulmonic valve was normal in structure. Pulmonic valve  regurgitation is not visualized by color flow Doppler.   9. The inferior vena cava is normal in size with greater than 50%  respiratory variability, suggesting right atrial pressure of 3 mmHg.    Epic records are reviewed at length today.   CHA2DS2-VASc Score = 2  The patient's score is based upon: CHF History: 0 HTN History: 1 Diabetes History: 0 Stroke History: 0 Vascular Disease History: 1 Age Score: 0 Gender Score: 0         ASSESSMENT AND PLAN: 1. Persistent Atrial Fibrillation (ICD10:  I48.19) The patient's CHA2DS2-VASc score is 2, indicating a 2.2% annual risk of stroke.   General education about afib provided and questions answered. We also discussed his stroke risk and the risks and benefits of anticoagulation. Will start Eliquis 5 mg BID. We discussed rhythm control options. Will plan for DCCV after 3 weeks of uninterrupted anticoagulation.  Will not start AV nodal agent at this time given his adequate rate control.  If he has quick return of his afib, we did discuss AAD or ablation for rhythm control.    2. Secondary  Hypercoagulable State (ICD10:  D68.69) The patient is at significant risk for stroke/thromboembolism based upon his CHA2DS2-VASc Score of 2.  Start Apixaban (Eliquis).    3. CML Followed by Dr Truett Perna   4. HTN Stable, no changes today.   5. CAD CAC score 57, mild disease On statin     Follow up in the AF clinic post DCCV.      Jorja Loa PA-C [facility] [address] [place], [place] [address] [phone] 08/24/2022 12:12 PM      For DCCV; no changes; compliant with apixaban. Olga Millers

## 2022-09-17 NOTE — Procedures (Addendum)
Electrical Cardioversion Procedure Note [place] [id number] [birth year]  Procedure: Electrical Cardioversion Indications:  Atrial Fibrillation  Procedure Details Consent: Risks of procedure as well as the alternatives and risks of each were explained to the (patient/caregiver).  Consent for procedure obtained. Time Out: Verified patient identification, verified procedure, site/side was marked, verified correct patient position, special equipment/implants available, medications/allergies/relevent history reviewed, required imaging and test results available.  Performed  Patient placed on cardiac monitor, pulse oximetry, supplemental oxygen as necessary.  Sedation given:  Pt sedated by anesthesia with lidocaine 60 mg and diprovan  60 mg IV. Pacer pads placed anterior and posterior chest.  Cardioverted 1 time(s).  Cardioverted at 200J.  Evaluation Findings: Post procedure EKG shows: NSR Complications: None Patient did tolerate procedure well.   Olga Millers 09/17/2022, 9:29 AM

## 2022-09-17 NOTE — Interval H&P Note (Signed)
History and Physical Interval Note:  09/17/2022 9:31 AM  Patrick Nash  has presented today for surgery, with the diagnosis of AFIB.  The various methods of treatment have been discussed with the patient and family. After consideration of risks, benefits and other options for treatment, the patient has consented to  Procedure(s): CARDIOVERSION (N/A) as a surgical intervention.  The patient's history has been reviewed, patient examined, no change in status, stable for surgery.  I have reviewed the patient's chart and labs.  Questions were answered to the patient's satisfaction.     Olga Millers

## 2022-09-17 NOTE — Transfer of Care (Signed)
Immediate Anesthesia Transfer of Care Note  Patient: Patrick Nash  Procedure(s) Performed: CARDIOVERSION  Patient Location: Cath Lab  Anesthesia Type:MAC  Level of Consciousness: awake, alert , and oriented  Airway & Oxygen Therapy: Patient Spontanous Breathing and Patient connected to nasal cannula oxygen  Post-op Assessment: Report given to RN and Post -op Vital signs reviewed and stable  Post vital signs: Reviewed and stable  Last Vitals:  Vitals Value Taken Time  BP 100/66   Temp    Pulse 75   Resp 14   SpO2 100     Last Pain:  Vitals:   09/17/22 0930  TempSrc: Temporal  PainSc: 0-No pain         Complications: No notable events documented.

## 2022-09-17 NOTE — Discharge Instructions (Signed)

## 2022-09-17 NOTE — Anesthesia Procedure Notes (Signed)
Procedure Name: MAC Date/Time: 09/17/2022 9:48 AM  Performed by: Maxine Glenn, CRNAPre-anesthesia Checklist: Patient identified, Emergency Drugs available, Suction available and Patient being monitored Patient Re-evaluated:Patient Re-evaluated prior to induction Oxygen Delivery Method: Nasal cannula

## 2022-09-17 NOTE — Anesthesia Postprocedure Evaluation (Signed)
Anesthesia Post Note  Patient: Patrick Nash  Procedure(s) Performed: CARDIOVERSION     Patient location during evaluation: PACU Anesthesia Type: General Level of consciousness: awake and alert Pain management: pain level controlled Vital Signs Assessment: post-procedure vital signs reviewed and stable Respiratory status: spontaneous breathing, nonlabored ventilation and respiratory function stable Cardiovascular status: blood pressure returned to baseline Postop Assessment: no apparent nausea or vomiting Anesthetic complications: no   No notable events documented.  Last Vitals:  Vitals:   09/17/22 0955 09/17/22 1005  BP: 100/66 114/68  Pulse: 72 71  Resp: 18 16  Temp:    SpO2: 100% 100%    Last Pain:  Vitals:   09/17/22 1005  TempSrc:   PainSc: 0-No pain                 Shanda Howells

## 2022-09-17 NOTE — Anesthesia Preprocedure Evaluation (Addendum)
Anesthesia Evaluation  Patient identified by MRN, date of birth, ID band Patient awake    Reviewed: Allergy & Precautions, NPO status , Patient's Chart, lab work & pertinent test results  History of Anesthesia Complications Negative for: history of anesthetic complications  Airway Mallampati: II  TM Distance: >3 FB Neck ROM: Full    Dental no notable dental hx.    Pulmonary neg pulmonary ROS   Pulmonary exam normal        Cardiovascular hypertension, Pt. on medications Normal cardiovascular exam+ dysrhythmias Atrial Fibrillation   TTE 2020: EF 60-65%, valves ok   Neuro/Psych negative neurological ROS     GI/Hepatic negative GI ROS, Neg liver ROS,,,  Endo/Other  negative endocrine ROS    Renal/GU Renal InsufficiencyRenal disease     Musculoskeletal negative musculoskeletal ROS (+)    Abdominal   Peds  Hematology CML, in remission   Anesthesia Other Findings Day of surgery medications reviewed with patient.  Reproductive/Obstetrics                             Anesthesia Physical Anesthesia Plan  ASA: 3  Anesthesia Plan: General   Post-op Pain Management: Minimal or no pain anticipated   Induction: Intravenous  PONV Risk Score and Plan: 2 and Treatment may vary due to age or medical condition and Propofol infusion  Airway Management Planned: Mask  Additional Equipment: None  Intra-op Plan:   Post-operative Plan:   Informed Consent: I have reviewed the patients History and Physical, chart, labs and discussed the procedure including the risks, benefits and alternatives for the proposed anesthesia with the patient or authorized representative who has indicated his/her understanding and acceptance.       Plan Discussed with: CRNA  Anesthesia Plan Comments:         Anesthesia Quick Evaluation

## 2022-09-18 ENCOUNTER — Encounter (HOSPITAL_COMMUNITY): Payer: Self-pay | Admitting: Cardiology

## 2022-09-20 ENCOUNTER — Inpatient Hospital Stay: Admit: 2022-09-20 | Discharge: 2022-09-20 | Payer: BLUE CROSS/BLUE SHIELD | Primary: Internal Medicine

## 2022-09-20 DIAGNOSIS — K7689 Other specified diseases of liver: Secondary | ICD-10-CM

## 2022-09-21 ENCOUNTER — Other Ambulatory Visit: Payer: Self-pay | Admitting: Oncology

## 2022-09-22 ENCOUNTER — Inpatient Hospital Stay: Admit: 2022-09-22 | Discharge: 2022-09-22 | Payer: BLUE CROSS/BLUE SHIELD | Primary: Internal Medicine

## 2022-09-22 DIAGNOSIS — J984 Other disorders of lung: Secondary | ICD-10-CM

## 2022-09-25 ENCOUNTER — Telehealth: Admit: 2022-09-25 | Payer: PRIVATE HEALTH INSURANCE | Attending: Internal Medicine | Primary: Internal Medicine

## 2022-09-26 ENCOUNTER — Encounter: Admit: 2022-09-26 | Payer: PRIVATE HEALTH INSURANCE | Attending: Family | Primary: Internal Medicine

## 2022-09-26 ENCOUNTER — Telehealth: Admit: 2022-09-26 | Payer: PRIVATE HEALTH INSURANCE | Attending: Internal Medicine | Primary: Internal Medicine

## 2022-09-26 ENCOUNTER — Encounter: Admit: 2022-09-26 | Payer: PRIVATE HEALTH INSURANCE | Attending: Internal Medicine | Primary: Internal Medicine

## 2022-09-28 ENCOUNTER — Inpatient Hospital Stay: Admit: 2022-09-28 | Discharge: 2022-09-28 | Payer: BLUE CROSS/BLUE SHIELD | Primary: Internal Medicine

## 2022-09-28 ENCOUNTER — Ambulatory Visit: Admit: 2022-09-28 | Payer: BLUE CROSS/BLUE SHIELD | Attending: Urology | Primary: Internal Medicine

## 2022-09-28 ENCOUNTER — Ambulatory Visit: Admit: 2022-09-28 | Payer: BLUE CROSS/BLUE SHIELD | Attending: Internal Medicine | Primary: Internal Medicine

## 2022-09-28 ENCOUNTER — Encounter: Admit: 2022-09-28 | Payer: PRIVATE HEALTH INSURANCE | Attending: Internal Medicine | Primary: Internal Medicine

## 2022-09-28 ENCOUNTER — Encounter
Admit: 2022-09-28 | Payer: PRIVATE HEALTH INSURANCE | Attending: Vascular and Interventional Radiology | Primary: Internal Medicine

## 2022-09-28 ENCOUNTER — Encounter: Admit: 2022-09-28 | Payer: PRIVATE HEALTH INSURANCE | Attending: Urology | Primary: Internal Medicine

## 2022-09-28 DIAGNOSIS — Z0000 Encounter for general adult medical examination without abnormal findings: Secondary | ICD-10-CM

## 2022-09-28 DIAGNOSIS — Z125 Encounter for screening for malignant neoplasm of prostate: Secondary | ICD-10-CM

## 2022-09-28 DIAGNOSIS — Z83719 Family history of colon polyps, unspecified: Secondary | ICD-10-CM

## 2022-09-28 DIAGNOSIS — Z8572 Personal history of non-Hodgkin lymphomas: Secondary | ICD-10-CM

## 2022-09-28 DIAGNOSIS — N62 Hypertrophy of breast: Secondary | ICD-10-CM

## 2022-09-28 DIAGNOSIS — N400 Enlarged prostate without lower urinary tract symptoms: Secondary | ICD-10-CM

## 2022-10-01 ENCOUNTER — Ambulatory Visit (HOSPITAL_COMMUNITY)
Admission: RE | Admit: 2022-10-01 | Discharge: 2022-10-01 | Disposition: A | Discharge status: Home / Self Care | Payer: BC Managed Care – PPO | Source: Ambulatory Visit | Attending: Physician Assistant | Admitting: Physician Assistant

## 2022-10-01 ENCOUNTER — Encounter (HOSPITAL_COMMUNITY): Payer: Self-pay | Admitting: Physician Assistant

## 2022-10-01 VITALS — BP 124/62 | HR 76 | Ht 68.0 in | Wt 158.0 lb

## 2022-10-01 DIAGNOSIS — I4819 Other persistent atrial fibrillation: Secondary | ICD-10-CM | POA: Insufficient documentation

## 2022-10-01 DIAGNOSIS — Z7901 Long term (current) use of anticoagulants: Secondary | ICD-10-CM | POA: Insufficient documentation

## 2022-10-01 DIAGNOSIS — N183 Chronic kidney disease, stage 3 unspecified: Secondary | ICD-10-CM | POA: Insufficient documentation

## 2022-10-01 DIAGNOSIS — I2510 Atherosclerotic heart disease of native coronary artery without angina pectoris: Secondary | ICD-10-CM | POA: Diagnosis not present

## 2022-10-01 DIAGNOSIS — C9211 Chronic myeloid leukemia, BCR/ABL-positive, in remission: Secondary | ICD-10-CM | POA: Diagnosis not present

## 2022-10-01 DIAGNOSIS — I129 Hypertensive chronic kidney disease with stage 1 through stage 4 chronic kidney disease, or unspecified chronic kidney disease: Secondary | ICD-10-CM | POA: Diagnosis not present

## 2022-10-01 DIAGNOSIS — D6869 Other thrombophilia: Secondary | ICD-10-CM | POA: Diagnosis not present

## 2022-10-01 LAB — PSA, TOTAL (SCREENING) (BH GH L LMW YH): BKR PROSTATE SPECIFIC ANTIGEN, SCREENING: 0.596 ng/mL (ref 0.000–3.900)

## 2022-10-01 NOTE — Progress Notes (Signed)
Primary Care Physician: Rodrigo Ran, MD Primary Cardiologist: none Primary Electrophysiologist: none Referring Physician: Dr Adria Devon Deylan Echelbarger is a 62 y.o. male with a history of CML, CAD, CKD, HTN, atrial fibrillation who presents for follow up in the [facility].  The patient was initially diagnosed with atrial fibrillation 08/23/22 after presenting for routine follow up with his oncologist. Patient had noted that his pulse had been a little higher than usual on his home BP machine. And ECG was done which showed rate controlled afib. Patient has a CHADS2VASC score of 2. He did have a calcium score of 57 in 2021 on CT. He was started on Eliquis and underwent DCCV on 09/17/22.  On follow up today, patient reports that he has done well since his DCCV. He does not feel any different in SR compared to afib. No bleeding issues on anticoagulation.   Today, he denies symptoms of palpitations, chest pain, shortness of breath, orthopnea, PND, lower extremity edema, dizziness, presyncope, syncope, snoring, daytime somnolence, bleeding, or neurologic sequela. The patient is tolerating medications without difficulties and is otherwise without complaint today.    Atrial Fibrillation Risk Factors:  he does not have symptoms or diagnosis of sleep apnea. he does not have a history of rheumatic fever. he does not have a history of alcohol use. The patient does have a history of early familial atrial fibrillation or other arrhythmias. Father has afib.   he has a BMI of Body mass index is 24.02 kg/m.Marland Kitchen Filed Weights   10/01/22 1048  Weight: 71.7 kg    No family history on file.   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     apixaban (ELIQUIS) 5 MG TABS tablet Take 1 tablet (5 mg total) by mouth 2 (two) times daily. 60 tablet 6   imatinib (GLEEVEC) 400 MG tablet TAKE A HALF TABLET (200 MG) BY MOUTH ONCE DAILY WITH A MEAL AND LARGE GLASS OF WATER 15 tablet 2   Multiple Vitamin (MULTIVITAMIN) capsule Take 1 capsule by mouth daily. Centrum Silver     rosuvastatin (CRESTOR) 10 MG tablet Take 10 mg by mouth daily.     vitamin B-12 (CYANOCOBALAMIN) 1000 MCG tablet Take 1 tablet (1,000 mcg total) by mouth daily.     acetaminophen (TYLENOL) 500 MG tablet Take 1,000 mg by mouth every 6 (six) hours as needed for moderate pain.     tiZANidine (ZANAFLEX) 2 MG tablet Take 2 mg by mouth every 6 (six) hours as needed for muscle spasms. (Patient not taking: Reported on 10/01/2022)     No current facility-administered medications  for this encounter.    No Known Allergies  Social History   Socioeconomic History   Marital status: Single    Spouse name: Not on file   Number of children: Not on file   Years of education: Not on file   Highest education level: Not on file  Occupational History   Not on file  Tobacco Use   Smoking status: Never   Smokeless tobacco: Never  Substance and Sexual Activity   Alcohol use: No    Alcohol/week: 0.0 standard drinks of alcohol    Drug use: No   Sexual activity: Not on file  Other Topics Concern   Not on file  Social History Narrative   Not on file   Social Determinants of Health   Financial Resource Strain: Not on file  Food Insecurity: Not on file  Transportation Needs: Not on file  Physical Activity: Not on file  Stress: Not on file  Social Connections: Not on file  Intimate Partner Violence: Not on file     ROS- All systems are reviewed and negative except as per the HPI above.  Physical Exam: Vitals:   10/01/22 1048  BP: 124/62  Pulse: 76  Weight: 71.7 kg  Height: 5\' 8"  (1.727 m)     GEN- The patient is a well appearing male, alert and oriented x 3 today.   HEENT-head normocephalic, atraumatic, sclera clear, conjunctiva pink, hearing intact, trachea midline. Lungs- Clear to ausculation bilaterally, normal work of breathing Heart- Regular rate and rhythm, no murmurs, rubs or gallops  GI- soft, NT, ND, + BS Extremities- no clubbing, cyanosis, or edema MS- no significant deformity or atrophy Skin- no rash or lesion Psych- euthymic mood, full affect Neuro- strength and sensation are intact   Wt Readings from Last 3 Encounters:  10/01/22 71.7 kg  09/17/22 70.3 kg  08/24/22 71.1 kg    EKG today demonstrates  SR Vent. rate 76 BPM PR interval 174 ms QRS duration 72 ms QT/QTcB 358/402 ms  Echo 01/27/19 demonstrated  1. Left ventricular ejection fraction, by visual estimation, is 60 to  65%. The left ventricle has normal function. Normal left ventricular size.  There is no left ventricular hypertrophy.   2. Global right ventricle has normal systolic function.The right  ventricular size is normal. No increase in right ventricular wall  thickness.   3. Left atrial size was normal.   4. Right atrial size was normal.   5. The mitral valve is normal in structure. No evidence of mitral valve  regurgitation. No evidence of mitral stenosis.   6. The tricuspid valve is normal in  structure. Tricuspid valve  regurgitation was not visualized by color flow Doppler.   7. The aortic valve is normal in structure. Aortic valve regurgitation  was not visualized by color flow Doppler. Structurally normal aortic  valve, with no evidence of sclerosis or stenosis.   8. The pulmonic valve was normal in structure. Pulmonic valve  regurgitation is not visualized by color flow Doppler.   9. The inferior vena cava is normal in size with greater than 50%  respiratory variability, suggesting right atrial pressure of 3 mmHg.   Epic records are reviewed at length today.  CHA2DS2-VASc Score = 2  The patient's score is based upon: CHF History: 0 HTN History: 1 Diabetes History: 0 Stroke History: 0 Vascular Disease History: 1 Age Score: 0 Gender Score: 0       ASSESSMENT AND PLAN: 1. Persistent Atrial Fibrillation (ICD10:  I48.19)  The patient's CHA2DS2-VASc score is 2, indicating a 2.2% annual risk of stroke.   S/p DCCV 09/17/22 Patient appears to be maintaining SR.  Continue Eliquis 5 mg BID Will not start AV nodal agent at this time given his adequate rate control.  If he has quick return of his afib, we did discuss AAD or ablation for rhythm control.   2. Secondary Hypercoagulable State (ICD10:  D68.69) The patient is at significant risk for stroke/thromboembolism based upon his CHA2DS2-VASc Score of 2.  Continue Apixaban (Eliquis).   3. CML Followed by Dr Truett Perna  4. HTN Stable, no changes today.  5. CAD CAC score 57 in 2021, mild disease On statin No anginal symptoms. Will defer to primary cardiologist if any further testing is needed.    Follow up refer to establish care with a primary cardiologist, patient requesting Dr Jens Som who did his DCCV.   Jorja Loa PA-C Af[facility] [address] [place], [place] [address] [phone] 10/01/2022 11:13 AM

## 2022-11-08 ENCOUNTER — Inpatient Hospital Stay: Admit: 2022-11-08 | Discharge: 2022-11-08 | Payer: BLUE CROSS/BLUE SHIELD | Primary: Internal Medicine

## 2022-11-08 ENCOUNTER — Ambulatory Visit: Admit: 2022-11-08 | Payer: BLUE CROSS/BLUE SHIELD | Attending: Pulmonary Disease | Primary: Internal Medicine

## 2022-11-08 DIAGNOSIS — J984 Other disorders of lung: Secondary | ICD-10-CM

## 2022-11-13 ENCOUNTER — Telehealth: Payer: Self-pay | Admitting: *Deleted

## 2022-11-13 ENCOUNTER — Inpatient Hospital Stay: Payer: BC Managed Care – PPO | Attending: Oncology

## 2022-11-13 DIAGNOSIS — C9211 Chronic myeloid leukemia, BCR/ABL-positive, in remission: Secondary | ICD-10-CM | POA: Diagnosis not present

## 2022-11-13 LAB — CMP (CANCER CENTER ONLY)
ALT: 32 U/L (ref 0–44)
AST: 35 U/L (ref 15–41)
Albumin: 4.4 g/dL (ref 3.5–5.0)
Alkaline Phosphatase: 59 U/L (ref 38–126)
Anion gap: 5 (ref 5–15)
BUN: 25 mg/dL — ABNORMAL HIGH (ref 8–23)
CO2: 29 mmol/L (ref 22–32)
Calcium: 9.4 mg/dL (ref 8.9–10.3)
Chloride: 104 mmol/L (ref 98–111)
Creatinine: 1.67 mg/dL — ABNORMAL HIGH (ref 0.61–1.24)
GFR, Estimated: 46 mL/min — ABNORMAL LOW (ref >60)
Glucose, Bld: 94 mg/dL (ref 70–99)
Potassium: 4 mmol/L (ref 3.5–5.1)
Sodium: 138 mmol/L (ref 135–145)
Total Bilirubin: 0.5 mg/dL (ref 0.3–1.2)
Total Protein: 6.8 g/dL (ref 6.5–8.1)

## 2022-11-13 LAB — CBC WITH DIFFERENTIAL (CANCER CENTER ONLY)
Abs Immature Granulocytes: 0.01 10*3/uL (ref 0.00–0.07)
Basophils Absolute: 0.1 10*3/uL (ref 0.0–0.1)
Basophils Relative: 1 %
Eosinophils Absolute: 0.8 10*3/uL — ABNORMAL HIGH (ref 0.0–0.5)
Eosinophils Relative: 14 %
HCT: 35.5 % — ABNORMAL LOW (ref 39.0–52.0)
Hemoglobin: 11.8 g/dL — ABNORMAL LOW (ref 13.0–17.0)
Immature Granulocytes: 0 %
Lymphocytes Relative: 21 %
Lymphs Abs: 1.3 10*3/uL (ref 0.7–4.0)
MCH: 29.9 pg (ref 26.0–34.0)
MCHC: 33.2 g/dL (ref 30.0–36.0)
MCV: 90.1 fL (ref 80.0–100.0)
Monocytes Absolute: 0.6 10*3/uL (ref 0.1–1.0)
Monocytes Relative: 10 %
Neutro Abs: 3.3 10*3/uL (ref 1.7–7.7)
Neutrophils Relative %: 54 %
Platelet Count: 168 10*3/uL (ref 150–400)
RBC: 3.94 MIL/uL — ABNORMAL LOW (ref 4.22–5.81)
RDW: 13.1 % (ref 11.5–15.5)
WBC Count: 6.1 10*3/uL (ref 4.0–10.5)
nRBC: 0 % (ref 0.0–0.2)

## 2022-11-13 NOTE — Telephone Encounter (Signed)
Dr. Pollyann Kennedy had questioned the reason for a bcr/abl now thinking it is too soon when he came in for lab work today. Per Dr. Truett Perna: PCR 01/29/22 was not detected PCR 08/07/22 =0.0064% Thus, he would like to check again to see if it is still increasing. Sent this above message on MyChart and asked patient to check his message and respond since we are holding sample for his approval to send off.

## 2022-11-20 ENCOUNTER — Ambulatory Visit: Admit: 2022-11-20 | Payer: BLUE CROSS/BLUE SHIELD | Attending: Family | Primary: Internal Medicine

## 2022-11-20 ENCOUNTER — Encounter: Admit: 2022-11-20 | Payer: PRIVATE HEALTH INSURANCE | Attending: Family | Primary: Internal Medicine

## 2022-11-20 MED ORDER — TAMOXIFEN 10 MG TABLET
10 | Freq: Two times a day (BID) | ORAL | 0.00 refills | Status: AC
Start: 2022-11-20 — End: ?

## 2022-11-21 ENCOUNTER — Encounter: Admit: 2022-11-21 | Payer: PRIVATE HEALTH INSURANCE | Primary: Internal Medicine

## 2022-11-28 ENCOUNTER — Encounter
Admit: 2022-11-28 | Payer: PRIVATE HEALTH INSURANCE | Attending: Student in an Organized Health Care Education/Training Program | Primary: Internal Medicine

## 2022-11-28 ENCOUNTER — Ambulatory Visit
Admit: 2022-11-28 | Payer: BLUE CROSS/BLUE SHIELD | Attending: Student in an Organized Health Care Education/Training Program | Primary: Internal Medicine

## 2022-11-28 DIAGNOSIS — M1990 Unspecified osteoarthritis, unspecified site: Secondary | ICD-10-CM

## 2022-11-28 DIAGNOSIS — C8590 Non-Hodgkin lymphoma, unspecified, unspecified site: Secondary | ICD-10-CM

## 2022-11-28 NOTE — Patient Instructions
Hi Patrick Casey!It was lovely to see you today at the [facility]. Below are the steps we discussed:It sounds like you may have obstructive sleep apnea (OSA)This is where people have pauses in their breathing when they sleep because their upper airway collapses. This causes your brain to wake up many times a night, which makes sleep not very refreshing. Over time, untreated sleep apnea can add extra stress on your organs like your heart and brain.Symptoms including snoring, pauses in breathing while asleep, and sleepiness during the day. To test for this, I have ordered a home sleep test. On the day that you are scheduled, you can come into the clinic and pick up the equipment which will include a nasal canula that goes into your nose, on oxygen probe for your finger and a strap that goes around your chest. They will take ten minutes to show you how to set it up. You will take it home and wear it while you are sleeping. Then you have to drop it off with [place] afterwards. Occasionally, if the home sleep test doesn't show sleep apnea, we have to repeat the test in the sleep lab. It normally takes about a week to get the results of your sleep study and you should get a phone call about the results. If you do have sleep apnea we can talk about the next steps. For most people, we recommend treating with a CPAP machine. Please do not hesitate to reach out if you have any questions or concerns. Varney Baas, MD

## 2022-11-29 DIAGNOSIS — R29818 Other symptoms and signs involving the nervous system: Secondary | ICD-10-CM
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
## 2022-12-06 NOTE — Progress Notes (Deleted)
HPI: Follow-up atrial fibrillation.  This is my first time evaluating the patient.  Previously seen in atrial fibrillation clinic.  Has a history of CML, coronary artery disease, chronic kidney disease, hypertension and atrial fibrillation.  Echocardiogram September 2020 showed normal LV function.  Calcium score August 2021 57 which was 59th percentile.  Patient was originally diagnosed with atrial fibrillation in April 2024.  Placed on apixaban and underwent cardioversion on Sep 17, 2022.  At time of most recent office visit in June he was noted to be maintaining sinus rhythm.  He now presents to establish with me.  Since last seen  Current Outpatient Medications  Medication Sig Dispense Refill   acetaminophen (TYLENOL) 500 MG tablet Take 1,000 mg by mouth every 6 (six) hours as needed for moderate pain.     amLODipine (NORVASC) 2.5 MG tablet Take 2.5 mg by mouth daily.     apixaban (ELIQUIS) 5 MG TABS tablet Take 1 tablet (5 mg total) by mouth 2 (two) times daily. 60 tablet 6   imatinib (GLEEVEC) 400 MG tablet TAKE A HALF TABLET (200 MG) BY MOUTH ONCE DAILY WITH A MEAL AND LARGE GLASS OF WATER 15 tablet 2   Multiple Vitamin (MULTIVITAMIN) capsule Take 1 capsule by mouth daily. Centrum Silver     rosuvastatin (CRESTOR) 10 MG tablet Take 10 mg by mouth daily.     tiZANidine (ZANAFLEX) 2 MG tablet Take 2 mg by mouth every 6 (six) hours as needed for muscle spasms. (Patient not taking: Reported on 10/01/2022)     vitamin B-12 (CYANOCOBALAMIN) 1000 MCG tablet Take 1 tablet (1,000 mcg total) by mouth daily.     No current facility-administered medications for this visit.     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   15%  11/16; up to 27% 3/19; suspect chronic imatinib use   Dysphagia 12/12/2015   HTN (hypertension), benign 12/12/2015   Leukemia  (HCC) 2004   CML   Tendonitis of shoulder 10/10/2012   Left shoulder Following heavy lifting    Past Surgical History:  Procedure Laterality Date   CARDIOVERSION N/A 09/17/2022   Procedure: CARDIOVERSION;  Surgeon: Lewayne Bunting, MD;  Location: [facility];  Service: Cardiovascular;  Laterality: N/A;    Social History   Socioeconomic History   Marital status: Single    Spouse name: Not on file   Number of children: Not on file   Years of education: Not on file   Highest education level: Not on file  Occupational History   Not on file  Tobacco Use   Smoking status: Never   Smokeless tobacco: Never  Substance and Sexual Activity   Alcohol use: No    Alcohol/week: 0.0 standard drinks of alcohol   Drug use: No   Sexual activity: Not on file  Other Topics Concern   Not on file  Social History Narrative   Not on file   Social Determinants of Health   Financial Resource Strain: Not on file  Food Insecurity: Not on file  Transportation Needs: Not on file  Physical Activity: Not on file  Stress: Not on file  Social Connections: Not on file  Intimate Partner Violence: Not on file    No family history on file.  ROS: no fevers or chills, productive cough, hemoptysis, dysphasia, odynophagia, melena, hematochezia, dysuria,  hematuria, rash, seizure activity, orthopnea, PND, pedal edema, claudication. Remaining systems are negative.  Physical Exam: Well-developed well-nourished in no acute distress.  Skin is warm and dry.  HEENT is normal.  Neck is supple.  Chest is clear to auscultation with normal expansion.  Cardiovascular exam is regular rate and rhythm.  Abdominal exam nontender or distended. No masses palpated. Extremities show no edema. neuro grossly intact  ECG- personally reviewed  A/P  1 paroxysmal atrial fibrillation-patient remains in sinus rhythm status post cardioversion Sep 17, 2022.  Will add Toprol 25 mg daily and continue apixaban.  Check  TSH for completeness.  2 history of coronary calcification-noted on previous calcium score.  Patient denies chest pain.  Continue statin.  3 hypertension-patient's blood pressure is controlled.  Continue present medications.  4 hyperlipidemia-continue statin.  5 CML-managed by oncology.  Olga Millers, MD

## 2022-12-07 ENCOUNTER — Encounter: Admit: 2022-12-07 | Payer: PRIVATE HEALTH INSURANCE | Attending: Internal Medicine | Primary: Internal Medicine

## 2022-12-07 ENCOUNTER — Ambulatory Visit: Admit: 2022-12-07 | Payer: BLUE CROSS/BLUE SHIELD | Attending: Internal Medicine | Primary: Internal Medicine

## 2022-12-07 MED ORDER — CLOTRIMAZOLE-BETAMETHASONE 1 %-0.05 % TOPICAL CREAM
1-0.05 | Freq: Two times a day (BID) | TOPICAL | 1 refills | 25.00000 days | Status: AC
Start: 2022-12-07 — End: ?

## 2022-12-14 ENCOUNTER — Ambulatory Visit: Payer: BC Managed Care – PPO | Admitting: Cardiology

## 2022-12-17 ENCOUNTER — Encounter: Admit: 2022-12-17 | Payer: BLUE CROSS/BLUE SHIELD | Attending: Family | Primary: Internal Medicine

## 2022-12-19 ENCOUNTER — Other Ambulatory Visit: Payer: Self-pay | Admitting: Oncology

## 2022-12-19 ENCOUNTER — Ambulatory Visit: Admit: 2022-12-19 | Payer: BLUE CROSS/BLUE SHIELD | Attending: Family | Primary: Internal Medicine

## 2022-12-19 ENCOUNTER — Encounter: Admit: 2022-12-19 | Payer: PRIVATE HEALTH INSURANCE | Attending: Family | Primary: Internal Medicine

## 2022-12-26 MED ORDER — NYSTATIN 100,000 UNIT/GRAM TOPICAL POWDER
100000 | Freq: Three times a day (TID) | TOPICAL | 2 refills | 15.00000 days | Status: AC
Start: 2022-12-26 — End: ?

## 2022-12-28 ENCOUNTER — Encounter
Admit: 2022-12-28 | Payer: PRIVATE HEALTH INSURANCE | Attending: Vascular and Interventional Radiology | Primary: Internal Medicine

## 2022-12-28 ENCOUNTER — Encounter: Admit: 2022-12-28 | Payer: PRIVATE HEALTH INSURANCE | Primary: Internal Medicine

## 2022-12-28 ENCOUNTER — Encounter: Admit: 2022-12-28 | Payer: PRIVATE HEALTH INSURANCE | Attending: Family | Primary: Internal Medicine

## 2022-12-28 ENCOUNTER — Ambulatory Visit: Admit: 2022-12-28 | Payer: BLUE CROSS/BLUE SHIELD | Attending: Family | Primary: Internal Medicine

## 2022-12-29 ENCOUNTER — Encounter
Admit: 2022-12-29 | Payer: PRIVATE HEALTH INSURANCE | Attending: Vascular and Interventional Radiology | Primary: Internal Medicine

## 2022-12-29 ENCOUNTER — Inpatient Hospital Stay: Admit: 2022-12-29 | Discharge: 2022-12-29 | Payer: BLUE CROSS/BLUE SHIELD | Primary: Internal Medicine

## 2022-12-29 DIAGNOSIS — G5602 Carpal tunnel syndrome, left upper limb: Secondary | ICD-10-CM

## 2022-12-29 DIAGNOSIS — Z01818 Encounter for other preprocedural examination: Secondary | ICD-10-CM

## 2022-12-29 DIAGNOSIS — G5622 Lesion of ulnar nerve, left upper limb: Secondary | ICD-10-CM

## 2022-12-29 LAB — CBC WITH AUTO DIFFERENTIAL
BKR WAM ABSOLUTE IMMATURE GRANULOCYTES.: 0.02 x 1000/ÂµL (ref 0.00–0.30)
BKR WAM ABSOLUTE LYMPHOCYTE COUNT.: 1.4 x 1000/ÂµL (ref 0.60–3.70)
BKR WAM ABSOLUTE NRBC (2 DEC): 0 x 1000/ÂµL (ref 0.00–1.00)
BKR WAM ANC (ABSOLUTE NEUTROPHIL COUNT): 4.18 x 1000/ÂµL (ref 2.00–7.60)
BKR WAM BASOPHIL ABSOLUTE COUNT.: 0.07 x 1000/ÂµL (ref 0.00–1.00)
BKR WAM BASOPHILS: 1.1 % (ref 0.0–1.4)
BKR WAM EOSINOPHIL ABSOLUTE COUNT.: 0.21 x 1000/ÂµL (ref 0.00–1.00)
BKR WAM EOSINOPHILS: 3.2 % (ref 0.0–5.0)
BKR WAM HEMATOCRIT (2 DEC): 40.2 % (ref 38.50–50.00)
BKR WAM HEMOGLOBIN: 13.2 g/dL (ref 13.2–17.1)
BKR WAM IMMATURE GRANULOCYTES: 0.3 % (ref 0.0–1.0)
BKR WAM LYMPHOCYTES: 21.3 % (ref 17.0–50.0)
BKR WAM MCH (PG): 31.8 pg (ref 27.0–33.0)
BKR WAM MCHC: 32.8 g/dL (ref 31.0–36.0)
BKR WAM MCV: 96.9 fL (ref 80.0–100.0)
BKR WAM MONOCYTE ABSOLUTE COUNT.: 0.69 x 1000/ÂµL (ref 0.00–1.00)
BKR WAM MONOCYTES: 10.5 % (ref 4.0–12.0)
BKR WAM MPV: 11.5 fL (ref 8.0–12.0)
BKR WAM NEUTROPHILS: 63.6 % (ref 39.0–72.0)
BKR WAM NUCLEATED RED BLOOD CELLS: 0 % (ref 0.0–1.0)
BKR WAM PLATELETS: 248 x1000/ÂµL (ref 150–420)
BKR WAM RDW-CV: 14.5 % (ref 11.0–15.0)
BKR WAM RED BLOOD CELL COUNT.: 4.15 M/ÂµL (ref 4.00–6.00)
BKR WAM WHITE BLOOD CELL COUNT: 6.6 x1000/ÂµL (ref 4.0–11.0)

## 2023-01-04 ENCOUNTER — Ambulatory Visit
Admit: 2023-01-04 | Payer: BLUE CROSS/BLUE SHIELD | Attending: Pulmonary Critical Care Medicine | Primary: Internal Medicine

## 2023-01-04 ENCOUNTER — Encounter: Admit: 2023-01-04 | Payer: PRIVATE HEALTH INSURANCE | Primary: Internal Medicine

## 2023-01-06 ENCOUNTER — Encounter: Admit: 2023-01-06 | Payer: PRIVATE HEALTH INSURANCE | Attending: "Endocrinology | Primary: Internal Medicine

## 2023-01-06 ENCOUNTER — Inpatient Hospital Stay: Admit: 2023-01-06 | Discharge: 2023-01-06 | Payer: BLUE CROSS/BLUE SHIELD

## 2023-01-06 DIAGNOSIS — C8590 Non-Hodgkin lymphoma, unspecified, unspecified site: Secondary | ICD-10-CM

## 2023-01-06 DIAGNOSIS — M1990 Unspecified osteoarthritis, unspecified site: Secondary | ICD-10-CM

## 2023-01-06 MED ORDER — ZINC OXIDE TOPICAL OINTMENT
TOPICAL | 1 refills | Status: AC | PRN
Start: 2023-01-06 — End: ?

## 2023-01-06 NOTE — ED Notes
4:26 PM Pt ambulated to rm 16.  Pt changed into gown.  Warm blanket for comfort.4:28 PM  PA, Ryan at bedside.5:06 PM  Pt seen and discharged by PA, Ryan.  Pt ambulated out of the ED with a steady gait and in NAD.

## 2023-01-06 NOTE — ED Provider Notes
Chief Complaint Patient presents with  Rash   Rash to groin area for last month. Has seen primary for same and has tried creams with no relief. Was painful today to walk.  MDM 62 year old man with history of non-Hodgkin's lymphoma coming in with concerns over rash to the groin area over the last month.  Reports he had started the rash with an itchy sensation and no pain.  Denies any fevers or chills.  He was tried topical medications for tinea cruris with some relief but has recurrence of some symptoms.Differential includes skin irritation, tinea cruris, consider but doubt cellulitis, Fournier'sScrotum with mild erythema and some redness at the tip of the shaft of the penis with no signs of purulence, vesicles,Non swollen scrotumBased off the exam is more consistent with skin breakdown from treatment for tinea cruris, however given he was some residual itching symptoms believe there could be some fungal infection that has persisting.  I think he was benefit from a barrier cream like seeing oxide and continued drying of the groin areaConsider lab work in imaging but did not feel it is necessary based off his exam and historyDr. Sherron Flemings available Physical ExamED Triage Vitals [01/06/23 1614]BP: (!) 140/74Pulse: (!) 59Pulse from  O2 sat: n/aResp: 18Temp: 98.3 ?F (36.8 ?C)Temp src: TemporalSpO2: 100 % BP (!) 140/74  - Pulse (!) 59  - Temp 98.3 ?F (36.8 ?C) (Temporal)  - Resp 18  - Ht 5' 8.5 (1.74 m)  - Wt 74.8 kg  - SpO2 100%  - BMI 24.72 kg/m? Physical Exam ProceduresAttestation/Critical CareClinical Impressions as of 01/06/23 1714 Jock itch  ED DispositionDischarge Sherron Monday, PA09/08/24 1714

## 2023-01-06 NOTE — Discharge Instructions
Apply the zinc oxide paste over the groin area. Keep the area as dry as you can. Continue to apply the nystatin powder once daily. Wear loose fitting clean boxers.

## 2023-01-07 ENCOUNTER — Ambulatory Visit: Admit: 2023-01-07 | Payer: BLUE CROSS/BLUE SHIELD | Primary: Internal Medicine

## 2023-01-09 DIAGNOSIS — H31003 Unspecified chorioretinal scars, bilateral: Secondary | ICD-10-CM | POA: Diagnosis not present

## 2023-01-09 DIAGNOSIS — H5203 Hypermetropia, bilateral: Secondary | ICD-10-CM | POA: Diagnosis not present

## 2023-01-19 LAB — BCR/ABL

## 2023-01-29 DIAGNOSIS — D649 Anemia, unspecified: Secondary | ICD-10-CM | POA: Diagnosis not present

## 2023-01-29 DIAGNOSIS — E611 Iron deficiency: Secondary | ICD-10-CM | POA: Diagnosis not present

## 2023-01-29 DIAGNOSIS — R7301 Impaired fasting glucose: Secondary | ICD-10-CM | POA: Diagnosis not present

## 2023-01-29 DIAGNOSIS — I10 Essential (primary) hypertension: Secondary | ICD-10-CM | POA: Diagnosis not present

## 2023-01-29 DIAGNOSIS — Z1212 Encounter for screening for malignant neoplasm of rectum: Secondary | ICD-10-CM | POA: Diagnosis not present

## 2023-01-31 NOTE — Progress Notes (Signed)
HPI: Follow-up atrial fibrillation. He has a history of CML, coronary artery disease, hypertension, chronic kidney disease and atrial fibrillation.  Echocardiogram September 2020 showed normal LV function.  Calcium score August 2021 57 which was 59th percentile.  Initially diagnosed with atrial fibrillation in April 2024.  Underwent cardioversion successfully May 2024.  Since last seen he is doing well with no chest pain, dyspnea, palpitations or syncope.  No bleeding.  Current Outpatient Medications  Medication Sig Dispense Refill   acetaminophen (TYLENOL) 500 MG tablet Take 1,000 mg by mouth every 6 (six) hours as needed for moderate pain.     amLODipine (NORVASC) 2.5 MG tablet Take 2.5 mg by mouth daily.     apixaban (ELIQUIS) 5 MG TABS tablet Take 1 tablet (5 mg total) by mouth 2 (two) times daily. 60 tablet 6   imatinib (GLEEVEC) 400 MG tablet TAKE A HALF TABLET (200 MG) BY MOUTH ONCE DAILY WITH A MEAL AND LARGE GLASS OF WATER 15 tablet 2   Multiple Vitamin (MULTIVITAMIN) capsule Take 1 capsule by mouth daily. Centrum Silver     rosuvastatin (CRESTOR) 10 MG tablet Take 10 mg by mouth daily.     tiZANidine (ZANAFLEX) 2 MG tablet Take 2 mg by mouth every 6 (six) hours as needed for muscle spasms.     vitamin B-12 (CYANOCOBALAMIN) 1000 MCG tablet Take 1 tablet (1,000 mcg total) by mouth daily.     No current facility-administered medications for this visit.     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   15%  11/16; up to 27% 3/19; suspect chronic imatinib use   Dysphagia 12/12/2015   HTN (hypertension), benign 12/12/2015   Leukemia (HCC) 2004   CML   Tendonitis of shoulder 10/10/2012   Left shoulder Following heavy lifting    Past Surgical History:  Procedure Laterality Date   CARDIOVERSION N/A 09/17/2022   Procedure: CARDIOVERSION;   Surgeon: Lewayne Bunting, MD;  Location: [facility];  Service: Cardiovascular;  Laterality: N/A;    Social History   Socioeconomic History   Marital status: Single    Spouse name: Not on file   Number of children: Not on file   Years of education: Not on file   Highest education level: Not on file  Occupational History   Not on file  Tobacco Use   Smoking status: Never   Smokeless tobacco: Never  Substance and Sexual Activity   Alcohol use: No    Alcohol/week: 0.0 standard drinks of alcohol   Drug use: No   Sexual activity: Not on file  Other Topics Concern   Not on file  Social History Narrative   Not on file   Social Determinants of Health   Financial Resource Strain: Not on file  Food Insecurity: Not on file  Transportation Needs: Not on file  Physical Activity: Not on file  Stress: Not on file  Social Connections: Not on file  Intimate Partner Violence: Not on file    History reviewed. No pertinent family history.  ROS: no fevers or chills, productive cough, hemoptysis, dysphasia, odynophagia, melena, hematochezia, dysuria, hematuria, rash, seizure activity, orthopnea, PND, pedal edema, claudication. Remaining systems are negative.  Physical Exam: Well-developed well-nourished in no acute distress.  Skin is warm and dry.  HEENT is normal.  Neck is supple.  Chest is  clear to auscultation with normal expansion.  Cardiovascular exam is regular rate and rhythm.  Abdominal exam nontender or distended. No masses palpated. Extremities show no edema. neuro grossly intact   A/P  1 paroxysmal atrial fibrillation-patient remains in sinus rhythm on exam status post previous cardioversion.  Will continue apixaban.  If he has recurrent episodes in the future best option may be rate control and anticoagulation as he is asymptomatic.  We will update echocardiogram.  Check TSH for completeness.  2 coronary calcification-continue statin.  No aspirin given need for  apixaban.  He denies chest pain.  3 hypertension-blood pressure mildly elevated; however he has not taken his blood pressure medication last evening.  Follow blood pressure and adjust medications as needed.  4 hyperlipidemia-continue statin.  Olga Millers, MD

## 2023-02-05 NOTE — Procedures
 [facility] [address], [place], [place] [address]OZDGU: [phone] Fax: [phone] Jarold Motto, MD - Caralyn Guile, MD - Lorenza Evangelist, MD, PhD - Ortencia Kick, [place] - Duwaine Maxin, Eloise Harman, MD - Hadassah Pais, MD - Lora Havens, PhD, DBSM - Cheral Almas, MD - Hildred Alamin, MDLynelle Schneeberg, PsyD - Arsenio Katz, MD - Wynona Neat, MD - Christella Noa, MD Donovan Kail, MD, MS, Medical Director Valora Piccolo, MD, MPH, Director Patient Name:   Patrick Casey of Birth:  Jul 02, 1962Date of Service: 9/9/2024EPIC MRN: [id number] Referring provider: Johnella Moloney, APRNPrimary care provider: Koren Shiver, MDSleep Consultant:   Kirstie Mirza, MDRecording Technologist: Melven Sartorius, RPSGTScoring Technologist: Grayce Sessions, RPSGTHOME SLEEP APNEA TESTHISTORY/INDICATION:This 62 year old male was referred for a home sleep apnea study to evaluate symptoms of   snoring, witnessed apnea, insomnia/sleep disruption, and unrefreshing sleep.  The Epworth Sleepiness Scale is 9. BMI is 25.1.PAST MEDICAL HISTORY:Patrick Casey  has a past medical history of Arthritis and NHL (non-Hodgkin's lymphoma) (HC Code).MEDICATIONS:Current Outpatient Medications:   calcium carbonate 1250 mg (500 mg calcium) - Vitamin D3 200 Unit tablet, Take 1 tablet by mouth 2 (two) times daily (0800, 1800)., Disp: , Rfl:   clotrimazole-betamethasone (LOTRISONE) 1-0.05 % cream, Apply topically 2 (two) times daily., Disp: 30 g, Rfl: 0  doxylamine succinate (UNISOM, DOXYLAMINE,) 25 mg tablet, Take 1 tablet (25 mg total) by mouth nightly as needed for sleep., Disp: , Rfl:   glucosamine-chondroitin 500-400 mg tablet, Take 1 tablet by mouth 3 (three) times daily., Disp: , Rfl:   liver oil-zinc oxide (DESITIN) ointment, Apply topically as needed for dry skin., Disp: 56.7 g, Rfl: 0  multivitamin (MULTIPLE VITAMIN ORAL), Take 1 tablet by mouth daily., Disp: , Rfl:   nystatin (MYCOSTATIN) 100,000 unit/gram powder, Apply 1 Application  topically 3 (three) times daily., Disp: 60 g, Rfl: 1  tadalafiL (CIALIS) 20 mg tablet, Take 1 tablet (20 mg total) by mouth daily as needed for erectile dysfunction., Disp: 30 tablet, Rfl: 11  tadalafiL (CIALIS) 5 mg tablet, Take 1 tablet (5 mg total) by mouth daily., Disp: 90 tablet, Rfl: 3  tamoxifen (NOLVADEX) 10 mg tablet, Take 1 tablet (10 mg total) by mouth 2 (two) times daily., Disp: , Rfl: PROCEDURE:Level III, unattended home sleep test for evaluation of sleep apnea using Respironics NightOne with monitoring of total recording time, body position, snoring, airflow (nasal pressure transducer), thoracic respiratory effort (inductance plethysmography), pulse oximetry, and pulse rate. Patient was instructed by a registered sleep technologist on how to self-administer the test and was able to demonstrate proper set up. Data were acquired and manually scored using either the recommended or acceptable rules according to American Academy of Sleep Medicine guidelines (AASM Manual for the Scoring of Sleep and Associated Events, 2012) and reviewed by a board certified sleep physician per AASM guidelines (J Clin Sleep Med 2012;8(5):597-619). (Definition: HSAT-REI = sum of apneas and hypopneas indexed per hour of scorable monitoring time SMT on home sleep test. Monitoring Time equals Total Recording Time minus periods of artifact and time the patient was awake as determined by body position sensor, respiratory pattern or patient comments on post-test questionnaire.)  After review of respiratory data, the study was found to be technically adequate for the interpretation of sleep-disordered breathing. Hypopneas were scored based on recommended (as opposed to acceptable) criteria. THE REI REPORTED IN THIS STUDY IS A SURROGATE FOR  AHI.FINDINGS:      Recording:Clock time start of recording 01/07/2023 at 7:56:51 PM Clock time end of recording 01/08/2023 at 4:41:45 AM Lights Off 7:57:51 PM Lights On 3:42:15 AM Total Recording Time: 524.9 Minutes Scorable Monitoring Time: 460.9 Minutes Total Supine Time:  32.0 Minutes There was an adequate amount of recording time for analysis.Respiratory Findings:HSAT-REI (3%):  7.2  HSAT-REI (4%): 6.1 Supine HSAT-REI (3%): 37.5  Prone HSAT-REI (3%): 2.2 Right HSAT-REI (3%): 6.0 Left HSAT-REI (3%): 5.6 Up HSAT-REI (3%): 0.0 Central HSAT-REI: 0.00 Hypopneas (3%): 12  Hypopneas (4%): 4 Central Apneas: 0  Mixed Apneas: 0  Obstructive Apneas: 43  Nadir SaO2: 73 %  Mean SaO2: 94 %   Snoring was noted.SpO2 % Total  Dur in Minutes % TIB <70 0.0 0.0 <80 2.3 0.5 <85 2.9 0.6 <90 9.4 2.0 <89 6.6  Cardiac Findings:Mean Pulse Rate: 54.6Highest Pulse Rate during SMT: 75Lowest Pulse Rate during SMT: 43  Night Hypnogram:IMPRESSION:- Obstructive Sleep Apnea (G47.33): Overall REI of 7.2 (3%) /hr and 6.1 (4%) /hr, arterial oxygen saturation nadir of 73 %. Supine REI was 37.5- THE REI REPORTED IN THIS STUDY IS A SURROGATE FOR AHI.RECOMMENDATIONS: - Auto-titrating positive airway pressure (PAP) therapy 5-20 cmH2O will be ordered. Sleep maintenance insomnia is qualifying condition.  After initiation of PAP therapy, the patient will have close follow-up with their durable medical equipment company to assist with acclimation to therapy and will then follow-up with their sleep provider.  - The patient was called and results discussed.  - Theatre stage manager of Physicians recommendations for treatment of obstructive sleep apnea include 1) weight loss in overweight patients, 2) positive airway pressure (PAP) as initial therapy, and 3) oral appliance therapy as alternative therapy for select patients.  Additional treatment options in select patients include nasal expiratory resistance device therapy (EPAP), airway surgery, and positional therapy (i.e. avoiding the supine sleep position).FOLLOW UP:In clinic 31-90 days after receiving CPAP. Electronically signed by Olivia Canter, MD

## 2023-02-06 ENCOUNTER — Encounter: Admit: 2023-02-06 | Payer: PRIVATE HEALTH INSURANCE | Primary: Internal Medicine

## 2023-02-08 ENCOUNTER — Encounter: Payer: Self-pay | Admitting: Oncology

## 2023-02-08 ENCOUNTER — Telehealth: Payer: Self-pay | Admitting: *Deleted

## 2023-02-08 DIAGNOSIS — I10 Essential (primary) hypertension: Secondary | ICD-10-CM | POA: Diagnosis not present

## 2023-02-08 DIAGNOSIS — Z0000 Encounter for general adult medical examination without abnormal findings: Secondary | ICD-10-CM | POA: Diagnosis not present

## 2023-02-08 DIAGNOSIS — R82998 Other abnormal findings in urine: Secondary | ICD-10-CM | POA: Diagnosis not present

## 2023-02-08 DIAGNOSIS — Z1339 Encounter for screening examination for other mental health and behavioral disorders: Secondary | ICD-10-CM | POA: Diagnosis not present

## 2023-02-08 DIAGNOSIS — C9590 Leukemia, unspecified not having achieved remission: Secondary | ICD-10-CM | POA: Diagnosis not present

## 2023-02-08 DIAGNOSIS — Z1331 Encounter for screening for depression: Secondary | ICD-10-CM | POA: Diagnosis not present

## 2023-02-08 NOTE — Telephone Encounter (Signed)
Insurance is requiring office note that addresses the BCR-ABL drawn on 11-13-22 for claim to process. Faxed 08/23/22 office note to [phone]

## 2023-02-12 ENCOUNTER — Inpatient Hospital Stay: Payer: BC Managed Care – PPO | Attending: Oncology

## 2023-02-12 ENCOUNTER — Inpatient Hospital Stay: Payer: BC Managed Care – PPO | Admitting: Oncology

## 2023-02-12 DIAGNOSIS — I4891 Unspecified atrial fibrillation: Secondary | ICD-10-CM | POA: Insufficient documentation

## 2023-02-12 DIAGNOSIS — N189 Chronic kidney disease, unspecified: Secondary | ICD-10-CM | POA: Insufficient documentation

## 2023-02-12 DIAGNOSIS — C9211 Chronic myeloid leukemia, BCR/ABL-positive, in remission: Secondary | ICD-10-CM | POA: Insufficient documentation

## 2023-02-12 DIAGNOSIS — R7401 Elevation of levels of liver transaminase levels: Secondary | ICD-10-CM | POA: Insufficient documentation

## 2023-02-12 DIAGNOSIS — D721 Eosinophilia, unspecified: Secondary | ICD-10-CM | POA: Insufficient documentation

## 2023-02-12 DIAGNOSIS — D631 Anemia in chronic kidney disease: Secondary | ICD-10-CM | POA: Insufficient documentation

## 2023-02-12 DIAGNOSIS — I129 Hypertensive chronic kidney disease with stage 1 through stage 4 chronic kidney disease, or unspecified chronic kidney disease: Secondary | ICD-10-CM | POA: Insufficient documentation

## 2023-02-14 ENCOUNTER — Encounter: Payer: Self-pay | Admitting: Cardiology

## 2023-02-14 ENCOUNTER — Ambulatory Visit: Payer: BC Managed Care – PPO | Attending: Cardiology | Admitting: Cardiology

## 2023-02-14 VITALS — BP 140/72 | HR 78 | Ht 67.0 in | Wt 156.0 lb

## 2023-02-14 DIAGNOSIS — I10 Essential (primary) hypertension: Secondary | ICD-10-CM | POA: Diagnosis not present

## 2023-02-14 DIAGNOSIS — I480 Paroxysmal atrial fibrillation: Secondary | ICD-10-CM

## 2023-02-14 DIAGNOSIS — E7800 Pure hypercholesterolemia, unspecified: Secondary | ICD-10-CM

## 2023-02-14 NOTE — Patient Instructions (Signed)
Medication Instructions:  No change  *If you need a refill on your cardiac medications before your next appointment, please call your pharmacy*   Lab Work: TSH  If you have labs (blood work) drawn today and your tests are completely normal, you will receive your results only by: MyChart Message (if you have MyChart) OR A paper copy in the mail If you have any lab test that is abnormal or we need to change your treatment, we will call you to review the results.   Testing/Procedures: Your physician has requested that you have an echocardiogram. Echocardiography is a painless test that uses sound waves to create images of your heart. It provides your doctor with information about the size and shape of your heart and how well your heart's chambers and valves are working. This procedure takes approximately one hour. There are no restrictions for this procedure. Please do NOT wear cologne, perfume, aftershave, or lotions (deodorant is allowed). Please arrive 15 minutes prior to your appointment time.    Follow-Up: At [facility], you and your health needs are our priority.  As part of our continuing mission to provide you with exceptional heart care, we have created designated Provider Care Teams.  These Care Teams include your primary Cardiologist (physician) and Advanced Practice Providers (APPs -  Physician Assistants and Nurse Practitioners) who all work together to provide you with the care you need, when you need it.  We recommend signing up for the patient portal called "MyChart".  Sign up information is provided on this After Visit Summary.  MyChart is used to connect with patients for Virtual Visits (Telemedicine).  Patients are able to view lab/test results, encounter notes, upcoming appointments, etc.  Non-urgent messages can be sent to your provider as well.   To learn more about what you can do with MyChart, go to [email or web address].    Your next appointment:   6  month(s)  Provider:   Olga Millers, MD     Other Instructions None

## 2023-02-15 LAB — TSH: TSH: 3.42 u[IU]/mL (ref 0.450–4.500)

## 2023-02-18 ENCOUNTER — Encounter: Payer: Self-pay | Admitting: *Deleted

## 2023-02-26 ENCOUNTER — Inpatient Hospital Stay: Payer: BC Managed Care – PPO | Admitting: Oncology

## 2023-02-26 VITALS — BP 124/73 | HR 69 | Temp 98.1°F | Resp 18 | Ht 67.0 in | Wt 157.4 lb

## 2023-02-26 DIAGNOSIS — C9211 Chronic myeloid leukemia, BCR/ABL-positive, in remission: Secondary | ICD-10-CM | POA: Diagnosis not present

## 2023-02-26 DIAGNOSIS — R7401 Elevation of levels of liver transaminase levels: Secondary | ICD-10-CM | POA: Diagnosis not present

## 2023-02-26 DIAGNOSIS — N189 Chronic kidney disease, unspecified: Secondary | ICD-10-CM | POA: Diagnosis not present

## 2023-02-26 DIAGNOSIS — I4891 Unspecified atrial fibrillation: Secondary | ICD-10-CM | POA: Diagnosis not present

## 2023-02-26 DIAGNOSIS — I129 Hypertensive chronic kidney disease with stage 1 through stage 4 chronic kidney disease, or unspecified chronic kidney disease: Secondary | ICD-10-CM | POA: Diagnosis not present

## 2023-02-26 DIAGNOSIS — D721 Eosinophilia, unspecified: Secondary | ICD-10-CM | POA: Diagnosis not present

## 2023-02-26 DIAGNOSIS — D631 Anemia in chronic kidney disease: Secondary | ICD-10-CM | POA: Diagnosis not present

## 2023-02-26 NOTE — Progress Notes (Signed)
  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  No complaint.  He reports he has been in sinus rhythm since undergoing cardioversion in May.  Objective:  Vital signs in last 24 hours:  Blood pressure 124/73, pulse 69, temperature 98.1 F (36.7 C), temperature source Oral, resp. rate 18, height 5\' 7"  (1.702 m), weight 157 lb 6.4 oz (71.4 kg), SpO2 100%.    HEENT: No thrush or ulcers Resp: Lungs clear bilaterally Cardio: Regular rate and rhythm GI: No hepatosplenomegaly Vascular: No leg edema Skin: No rash  Portacath/PICC-without erythema  Lab Results:  Lab Results  Component Value Date   WBC 6.1 11/13/2022   HGB 11.8 (L) 11/13/2022   HCT 35.5 (L) 11/13/2022   MCV 90.1 11/13/2022   PLT 168 11/13/2022   NEUTROABS 3.3 11/13/2022    CMP  Lab Results  Component Value Date   NA 138 11/13/2022   K 4.0 11/13/2022   CL 104 11/13/2022   CO2 29 11/13/2022   GLUCOSE 94 11/13/2022   BUN 25 (H) 11/13/2022   CREATININE 1.67 (H) 11/13/2022   CALCIUM 9.4 11/13/2022   PROT 6.8 11/13/2022   ALBUMIN 4.4 11/13/2022   AST 35 11/13/2022   ALT 32 11/13/2022   ALKPHOS 59 11/13/2022   BILITOT 0.5 11/13/2022   GFRNONAA 46 (L) 11/13/2022   GFRAA 51 (L) 11/09/2019    Lab Results  Component Value Date   CEA <1.00 01/29/2022    No results found for: "INR", "LABPROT"  Imaging:  No results found.  Medications: I have reviewed the patient's current medications.   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 Gleevec discontinued April 2017, resumed in June 2017 after a rise in the peripheral blood BCR-ABL Gleevec resumed and he again entered molecular remission Peripheral blood PCR detectable 11/09/2019, not detected 02/09/2020 Gleevec decreased to 200 mg daily beginning 02/24/2020 secondary to anemia/thrombocytopenia Peripheral blood PCR 09/20/2020-detected, below  limit of reporting Peripheral blood FISH 01/23/2021-negative Peripheral blood PCR 07/17/2021-0.01% Peripheral blood PCR 10/30/2021-detected below the level of measurement Peripheral blood PCR 01/29/2022-not detected Peripheral blood PCR 08/07/2022-0.0064% Blood PCR 11/13/2022-not detected Peripheral blood PCR 02/12/2023-not detected Hypertension Chronic renal insufficiency Chronic eosinophilia Systolic heart murmur noted on exam 11/13/2018 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  He will continue imatinib.  He will return for lab visit in 3 months and an office visit in 6 months.  He reports being up-to-date on pneumonia vaccination via Dr. Waynard Edwards.  Thornton Papas, MD  02/26/2023  11:40 AM

## 2023-03-06 ENCOUNTER — Encounter: Payer: Self-pay | Admitting: Internal Medicine

## 2023-03-08 ENCOUNTER — Ambulatory Visit (HOSPITAL_COMMUNITY): Payer: BC Managed Care – PPO | Attending: Cardiology

## 2023-03-08 DIAGNOSIS — E7800 Pure hypercholesterolemia, unspecified: Secondary | ICD-10-CM | POA: Diagnosis not present

## 2023-03-08 DIAGNOSIS — I10 Essential (primary) hypertension: Secondary | ICD-10-CM | POA: Diagnosis not present

## 2023-03-08 DIAGNOSIS — C9211 Chronic myeloid leukemia, BCR/ABL-positive, in remission: Secondary | ICD-10-CM | POA: Insufficient documentation

## 2023-03-08 DIAGNOSIS — I480 Paroxysmal atrial fibrillation: Secondary | ICD-10-CM | POA: Diagnosis not present

## 2023-03-08 LAB — ECHOCARDIOGRAM COMPLETE
Area-P 1/2: 3.74 cm2
Calc EF: 59.4 %
S' Lateral: 2.6 cm
Single Plane A2C EF: 62.1 %
Single Plane A4C EF: 55.3 %

## 2023-03-11 DIAGNOSIS — H6123 Impacted cerumen, bilateral: Secondary | ICD-10-CM | POA: Diagnosis not present

## 2023-03-25 ENCOUNTER — Encounter
Admit: 2023-03-25 | Payer: PRIVATE HEALTH INSURANCE | Attending: Student in an Organized Health Care Education/Training Program | Primary: Internal Medicine

## 2023-03-25 DIAGNOSIS — N62 Hypertrophy of breast: Secondary | ICD-10-CM

## 2023-03-26 ENCOUNTER — Inpatient Hospital Stay: Admit: 2023-03-26 | Discharge: 2023-03-26 | Payer: BLUE CROSS/BLUE SHIELD | Primary: Internal Medicine

## 2023-03-26 DIAGNOSIS — N400 Enlarged prostate without lower urinary tract symptoms: Secondary | ICD-10-CM

## 2023-03-26 DIAGNOSIS — Z8601 History of colonic polyps: Secondary | ICD-10-CM

## 2023-03-26 DIAGNOSIS — Z8572 Personal history of non-Hodgkin lymphomas: Secondary | ICD-10-CM

## 2023-03-26 DIAGNOSIS — N528 Other male erectile dysfunction: Secondary | ICD-10-CM

## 2023-03-26 DIAGNOSIS — N62 Hypertrophy of breast: Principal | ICD-10-CM

## 2023-03-26 DIAGNOSIS — C8228 Follicular lymphoma grade III, unspecified, lymph nodes of multiple sites: Secondary | ICD-10-CM

## 2023-03-26 DIAGNOSIS — Z83719 Family history of colon polyps, unspecified: Secondary | ICD-10-CM

## 2023-03-27 LAB — ESTRADIOL: BKR ESTRADIOL LEVEL: 26.9 pg/mL (ref <44.0)

## 2023-03-27 LAB — LH AND FSH     (BH GH L Q YH)
BKR FOLLICLE STIMULATING HORMONE: 3.6 IU/L
BKR LUTEINIZING HORMONE: 5.7 IU/L

## 2023-03-29 ENCOUNTER — Other Ambulatory Visit: Payer: Self-pay | Admitting: Oncology

## 2023-03-30 LAB — ARUP MISCELLANEOUS TEST, REFRIGERATED (BH GH LMW YH)

## 2023-04-02 ENCOUNTER — Encounter: Admit: 2023-04-02 | Payer: PRIVATE HEALTH INSURANCE | Primary: Internal Medicine

## 2023-05-10 ENCOUNTER — Encounter: Payer: Self-pay | Admitting: Oncology

## 2023-05-13 ENCOUNTER — Other Ambulatory Visit: Payer: Self-pay | Admitting: *Deleted

## 2023-05-13 ENCOUNTER — Other Ambulatory Visit: Payer: Self-pay

## 2023-05-13 ENCOUNTER — Other Ambulatory Visit (HOSPITAL_COMMUNITY): Payer: Self-pay

## 2023-05-13 ENCOUNTER — Telehealth: Payer: Self-pay | Admitting: *Deleted

## 2023-05-13 MED ORDER — IMATINIB MESYLATE 400 MG PO TABS
200.0000 mg | ORAL_TABLET | Freq: Every day | ORAL | 0 refills | Status: DC
  Filled 2023-05-13: qty 4, 8d supply, fill #0

## 2023-05-13 NOTE — Telephone Encounter (Signed)
 Informed Patrick Nash that Dr. Cloretta does not want him to miss any more than 3 doses of Gleevec . He reports he has only missed one dose so far and plans on reaching out to specialty pharmacy today. He will keep office posted. He declined nurse to call in a few days supply to [facility].

## 2023-05-14 ENCOUNTER — Other Ambulatory Visit: Payer: Self-pay

## 2023-05-14 NOTE — Progress Notes (Signed)
 Specialty Pharmacy Initiation Note   Patrick Nash is a 63 y.o. male who will be followed by the specialty pharmacy service for RxSp Oncology    Review of administration, indication, effectiveness, safety, potential side effects, storage/disposable, and missed dose instructions occurred today for patient's specialty medication(s) Imatinib  Mesylate (GLEEVEC )     Patient/Caregiver did not have any additional questions or concerns.   Patient's therapy is appropriate to: Continue    Goals Addressed             This Visit's Progress    Achieve or maintain remission       Patient is on track. Patient will maintain adherence         One time short fill at [facility] (Specialty) due to delays with patient's normal pharmacy.  Fama Muenchow N Jordanna Hendrie Specialty Pharmacist

## 2023-05-14 NOTE — Progress Notes (Signed)
 Specialty Pharmacy Initial Fill Coordination Note  Patrick Nash is a 63 y.o. male contacted today regarding initial fill of specialty medication(s) Imatinib  Mesylate (GLEEVEC )  Patient requested [facility] at [facility] [place] date: 05/14/23  Medication will be filled on 05/13/23.   Patient is aware of $2.68 copayment.    Morene Potters, CPhT Oncology Pharmacy Patient Advocate  [facility]  [phone] (phone) [phone] (fax) 05/14/2023 1:40 PM

## 2023-05-29 ENCOUNTER — Inpatient Hospital Stay: Payer: BLUE CROSS/BLUE SHIELD | Attending: Oncology

## 2023-05-29 ENCOUNTER — Other Ambulatory Visit: Payer: BC Managed Care – PPO

## 2023-05-29 DIAGNOSIS — C9211 Chronic myeloid leukemia, BCR/ABL-positive, in remission: Secondary | ICD-10-CM | POA: Insufficient documentation

## 2023-05-29 LAB — CMP (CANCER CENTER ONLY)
ALT: 31 U/L (ref 0–44)
AST: 30 U/L (ref 15–41)
Albumin: 3.8 g/dL (ref 3.5–5.0)
Alkaline Phosphatase: 70 U/L (ref 38–126)
Anion gap: 7 (ref 5–15)
BUN: 23 mg/dL (ref 8–23)
CO2: 28 mmol/L (ref 22–32)
Calcium: 9.3 mg/dL (ref 8.9–10.3)
Chloride: 105 mmol/L (ref 98–111)
Creatinine: 1.57 mg/dL — ABNORMAL HIGH (ref 0.61–1.24)
GFR, Estimated: 49 mL/min — ABNORMAL LOW (ref >60)
Glucose, Bld: 95 mg/dL (ref 70–99)
Potassium: 4.3 mmol/L (ref 3.5–5.1)
Sodium: 140 mmol/L (ref 135–145)
Total Bilirubin: 0.5 mg/dL (ref 0.0–1.2)
Total Protein: 6.9 g/dL (ref 6.5–8.1)

## 2023-05-29 LAB — CBC WITH DIFFERENTIAL (CANCER CENTER ONLY)
Abs Immature Granulocytes: 0.01 10*3/uL (ref 0.00–0.07)
Basophils Absolute: 0 10*3/uL (ref 0.0–0.1)
Basophils Relative: 1 %
Eosinophils Absolute: 0.6 10*3/uL — ABNORMAL HIGH (ref 0.0–0.5)
Eosinophils Relative: 8 %
HCT: 34.7 % — ABNORMAL LOW (ref 39.0–52.0)
Hemoglobin: 11.4 g/dL — ABNORMAL LOW (ref 13.0–17.0)
Immature Granulocytes: 0 %
Lymphocytes Relative: 25 %
Lymphs Abs: 1.9 10*3/uL (ref 0.7–4.0)
MCH: 29.4 pg (ref 26.0–34.0)
MCHC: 32.9 g/dL (ref 30.0–36.0)
MCV: 89.4 fL (ref 80.0–100.0)
Monocytes Absolute: 0.9 10*3/uL (ref 0.1–1.0)
Monocytes Relative: 11 %
Neutro Abs: 4.2 10*3/uL (ref 1.7–7.7)
Neutrophils Relative %: 55 %
Platelet Count: 223 10*3/uL (ref 150–400)
RBC: 3.88 MIL/uL — ABNORMAL LOW (ref 4.22–5.81)
RDW: 12.5 % (ref 11.5–15.5)
WBC Count: 7.6 10*3/uL (ref 4.0–10.5)
nRBC: 0 % (ref 0.0–0.2)

## 2023-06-05 ENCOUNTER — Other Ambulatory Visit: Payer: Self-pay | Admitting: *Deleted

## 2023-06-05 ENCOUNTER — Other Ambulatory Visit: Payer: Self-pay | Admitting: Oncology

## 2023-06-07 ENCOUNTER — Encounter: Payer: Self-pay | Admitting: Oncology

## 2023-06-07 ENCOUNTER — Other Ambulatory Visit: Payer: Self-pay | Admitting: *Deleted

## 2023-06-07 MED ORDER — IMATINIB MESYLATE 400 MG PO TABS: 200.0000 mg | ORAL_TABLET | Freq: Every day | ORAL | 0 refills | Status: DC

## 2023-06-14 ENCOUNTER — Telehealth: Admit: 2023-06-14 | Payer: PRIVATE HEALTH INSURANCE | Primary: Internal Medicine

## 2023-06-14 ENCOUNTER — Encounter
Admit: 2023-06-14 | Payer: PRIVATE HEALTH INSURANCE | Attending: Vascular and Interventional Radiology | Primary: Internal Medicine

## 2023-06-14 ENCOUNTER — Encounter: Admit: 2023-06-14 | Payer: PRIVATE HEALTH INSURANCE | Attending: Internal Medicine | Primary: Internal Medicine

## 2023-06-14 DIAGNOSIS — I2510 Atherosclerotic heart disease of native coronary artery without angina pectoris: Secondary | ICD-10-CM

## 2023-06-14 NOTE — Telephone Encounter
 Patient left msg to schedule echo.Called him back had to leave a msg

## 2023-06-15 ENCOUNTER — Encounter: Admit: 2023-06-15 | Payer: PRIVATE HEALTH INSURANCE | Primary: Internal Medicine

## 2023-06-17 ENCOUNTER — Encounter: Admit: 2023-06-17 | Payer: PRIVATE HEALTH INSURANCE | Attending: Internal Medicine | Primary: Internal Medicine

## 2023-06-17 DIAGNOSIS — I2510 Atherosclerotic heart disease of native coronary artery without angina pectoris: Secondary | ICD-10-CM

## 2023-06-21 ENCOUNTER — Telehealth: Payer: Self-pay | Admitting: Cardiology

## 2023-06-21 NOTE — Telephone Encounter (Signed)
 Patient wants a call back to discuss having a cardioversion.

## 2023-06-21 NOTE — Telephone Encounter (Signed)
 Patient identification verified by 2 forms. Patrick Rail, Patrick Nash    Called and spoke to patient  Patient states:   -Afib has returned   -had a cardioversion 09/17/22  -presented to ED yesterday due to Afib RVR   -still in Afib at this time   -received Rx for Metoprolol 12.5mg  BID   -he will have a new cardiology appointment with [facility] on 3/14   -he would like sooner appointment [facility] for [facility]   -Takes Eliquis 5mg  BID  Patient denies:   -Chest pain/pressure   -SOB/difficulty  Informed patient message sent to Dr. Jens Som and Dr. Levonne Spiller for input/advisement Reviewed ED warning signs/precautions  Patient verbalized understanding, no questions at this time

## 2023-06-24 ENCOUNTER — Ambulatory Visit
Admit: 2023-06-24 | Payer: BLUE CROSS/BLUE SHIELD | Attending: Pulmonary Critical Care Medicine | Primary: Internal Medicine

## 2023-06-24 ENCOUNTER — Encounter
Admit: 2023-06-24 | Payer: PRIVATE HEALTH INSURANCE | Attending: Pulmonary Critical Care Medicine | Primary: Internal Medicine

## 2023-06-24 VITALS — BP 131/82 | HR 66 | Temp 97.70000°F | Ht 68.5 in | Wt 175.0 lb

## 2023-06-24 DIAGNOSIS — G4733 Obstructive sleep apnea (adult) (pediatric): Secondary | ICD-10-CM

## 2023-06-24 DIAGNOSIS — M1990 Unspecified osteoarthritis, unspecified site: Secondary | ICD-10-CM

## 2023-06-24 DIAGNOSIS — C8590 Non-Hodgkin lymphoma, unspecified, unspecified site: Secondary | ICD-10-CM

## 2023-06-24 NOTE — Telephone Encounter (Signed)
 Pt called back and said he spoke to someone at the afib clinic. They are suppose to be calling him back with an appt

## 2023-06-24 NOTE — Patient Instructions
 Thank you for coming to the clinicWe think that you should consider trying some different masks - we recommend contacting your CPAP supplier (should have a sticker on your device with a phone number) and ask them for a mask fittingWe also recommend looking into a CPAP pillow for mask comfort - https://www.sleepfoundation.org/best-pillows/best-cpap-pillowsWe can consider an oral appliance (specially made by the sleep dentist) - https://my.FightingMatch.com.ee hold off on taking the Unisom nightlyIf you are interested in a drug to help with sleep, the drugs we could consider would be Doxepin or Silenor at a dose of 3mg .The alternative drug would be Daridorexant Lennox Solders) which is a newer medicationPlease contact us via MyChart if you have any questions or concerns in the interim
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
## 2023-07-03 ENCOUNTER — Encounter (HOSPITAL_COMMUNITY): Payer: Self-pay | Admitting: Internal Medicine

## 2023-07-03 ENCOUNTER — Ambulatory Visit (HOSPITAL_COMMUNITY)
Admission: RE | Admit: 2023-07-03 | Discharge: 2023-07-03 | Disposition: A | Discharge status: Home / Self Care | Payer: BLUE CROSS/BLUE SHIELD | Source: Ambulatory Visit | Attending: Internal Medicine | Admitting: Internal Medicine

## 2023-07-03 VITALS — BP 128/72 | HR 66 | Ht 67.0 in | Wt 156.8 lb

## 2023-07-03 DIAGNOSIS — N183 Chronic kidney disease, stage 3 unspecified: Secondary | ICD-10-CM | POA: Diagnosis not present

## 2023-07-03 DIAGNOSIS — I2510 Atherosclerotic heart disease of native coronary artery without angina pectoris: Secondary | ICD-10-CM | POA: Diagnosis not present

## 2023-07-03 DIAGNOSIS — I4819 Other persistent atrial fibrillation: Secondary | ICD-10-CM | POA: Diagnosis present

## 2023-07-03 DIAGNOSIS — C9211 Chronic myeloid leukemia, BCR/ABL-positive, in remission: Secondary | ICD-10-CM | POA: Diagnosis not present

## 2023-07-03 DIAGNOSIS — D6869 Other thrombophilia: Secondary | ICD-10-CM

## 2023-07-03 DIAGNOSIS — I129 Hypertensive chronic kidney disease with stage 1 through stage 4 chronic kidney disease, or unspecified chronic kidney disease: Secondary | ICD-10-CM | POA: Insufficient documentation

## 2023-07-03 DIAGNOSIS — Z7901 Long term (current) use of anticoagulants: Secondary | ICD-10-CM | POA: Diagnosis not present

## 2023-07-03 DIAGNOSIS — Z79899 Other long term (current) drug therapy: Secondary | ICD-10-CM | POA: Insufficient documentation

## 2023-07-03 NOTE — Progress Notes (Signed)
 Primary Care Physician: Rodrigo Ran, MD Primary Cardiologist: none Primary Electrophysiologist: none Referring Physician: Dr Patrick Nash is a 63 y.o. male with a history of CML, CAD, CKD, HTN, atrial fibrillation who presents for follow up in the [facility].  The patient was initially diagnosed with atrial fibrillation 08/23/22 after presenting for routine follow up with his oncologist. Patient had noted that his pulse had been a little higher than usual on his home BP machine. And ECG was done which showed rate controlled afib. Patient has a CHADS2VASC score of 2. He did have a calcium score of 57 in 2021 on CT. He was started on Eliquis and underwent DCCV on 09/17/22.  On follow up today, patient reports that he has done well since his DCCV. He does not feel any different in SR compared to afib. No bleeding issues on anticoagulation.   On follow up 07/03/23, he is currently in NSR. Seen at outside ED on 2/20 for Afib with RVR and was prescribed Lopressor 12.5 mg BID. Patient notes this is his first episode of Afib since DCCV in May of last year. He converted to NSR several days ago on 3/2. He confirmed this with father's Kardiamobile device. No missed doses of Eliquis 5 mg BID.   Today, he denies symptoms of palpitations, chest pain, shortness of breath, orthopnea, PND, lower extremity edema, dizziness, presyncope, syncope, snoring, daytime somnolence, bleeding, or neurologic sequela. The patient is tolerating medications without difficulties and is otherwise without complaint today.    Atrial Fibrillation Risk Factors:  he does not have symptoms or diagnosis of sleep apnea. he does not have a history of rheumatic fever. he does not have a history of alcohol use. The patient does have a history of early familial atrial fibrillation or other arrhythmias. Father has afib.   he has a BMI of Body mass index is 24.56 kg/m.Marland Kitchen Filed Weights    07/03/23 1153  Weight: 71.1 kg     No family history on file.   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   15%  11/16; up to 27% 3/19; suspect chronic imatinib use   Dysphagia 12/12/2015   HTN (hypertension), benign 12/12/2015   Leukemia (HCC) 2004   CML   Tendonitis of shoulder 10/10/2012   Left shoulder Following heavy lifting   Past Surgical History:  Procedure Laterality Date   CARDIOVERSION N/A 09/17/2022   Procedure: CARDIOVERSION;  Surgeon: Lewayne Bunting, MD;  Location: [facility] INVASIVE CV LAB;  Service: Cardiovascular;  Laterality: N/A;    Current Outpatient Medications  Medication Sig Dispense Refill   acetaminophen (TYLENOL) 500 MG tablet Take 1,000 mg by mouth every 6 (six) hours as needed for moderate pain.     amLODipine (NORVASC) 2.5 MG tablet Take 2.5 mg by mouth daily.     apixaban (ELIQUIS) 5 MG TABS tablet Take 1 tablet (5 mg total) by mouth 2 (two) times daily. 60 tablet 6   imatinib (GLEEVEC) 400 MG tablet Take 0.5 tablets (200 mg total) by mouth daily. Take with meals and large glass of water.Caution:Chemotherapy. 15 tablet 0   metoprolol tartrate (LOPRESSOR) 25 MG tablet Take 12.5 mg by mouth 2 (two) times daily.  Multiple Vitamin (MULTIVITAMIN) capsule Take 1 capsule by mouth daily. Centrum Silver     rosuvastatin (CRESTOR) 10 MG tablet Take 10 mg by mouth daily.     tiZANidine (ZANAFLEX) 2 MG tablet Take 2 mg by mouth every 6 (six) hours as needed for muscle spasms.     vitamin B-12 (CYANOCOBALAMIN) 1000 MCG tablet Take 1 tablet (1,000 mcg total) by mouth daily.     No current facility-administered medications for this encounter.    No  Known Allergies  ROS- All systems are reviewed and negative except as per the HPI above.  Physical Exam: Vitals:   07/03/23 1153  BP: 128/72  Pulse: 66  Weight: 71.1 kg  Height: 5\' 7"  (1.702 m)    GEN- The patient is well appearing, alert and oriented x 3 today.   Neck - no JVD or carotid bruit noted Lungs- Clear to ausculation bilaterally, normal work of breathing Heart- Regular rate and rhythm, no murmurs, rubs or gallops, PMI not laterally displaced Extremities- no clubbing, cyanosis, or edema Skin - no rash or ecchymosis noted   Wt Readings from Last 3 Encounters:  07/03/23 71.1 kg  02/26/23 71.4 kg  02/14/23 70.8 kg    EKG today demonstrates  Vent. rate 66 BPM PR interval 192 ms QRS duration 76 ms QT/QTcB 384/402 ms P-R-T axes 70 48 55 Normal sinus rhythm Possible Anterior infarct , age undetermined Abnormal ECG When compared with ECG of 01-Oct-2022 10:52, PREVIOUS ECG IS PRESENT  Echo 01/27/19 demonstrated  1. Left ventricular ejection fraction, by visual estimation, is 60 to  65%. The left ventricle has normal function. Normal left ventricular size.  There is no left ventricular hypertrophy.   2. Global right ventricle has normal systolic function.The right  ventricular size is normal. No increase in right ventricular wall  thickness.   3. Left atrial size was normal.   4. Right atrial size was normal.   5. The mitral valve is normal in structure. No evidence of mitral valve  regurgitation. No evidence of mitral stenosis.   6. The tricuspid valve is normal in structure. Tricuspid valve  regurgitation was not visualized by color flow Doppler.   7. The aortic valve is normal in structure. Aortic valve regurgitation  was not visualized by color flow Doppler. Structurally normal aortic  valve, with no evidence of sclerosis or stenosis.   8. The pulmonic valve was normal in structure. Pulmonic valve  regurgitation is not visualized by color flow Doppler.   9.  The inferior vena cava is normal in size with greater than 50%  respiratory variability, suggesting right atrial pressure of 3 mmHg.   Epic records are reviewed at length today.  CHA2DS2-VASc Score = 2  The patient's score is based upon: CHF History: 0 HTN History: 1 Diabetes History: 0 Stroke History: 0 Vascular Disease History: 1 Age Score: 0 Gender Score: 0       ASSESSMENT AND PLAN: 1. Persistent Atrial Fibrillation (ICD10:  I48.19) The patient's CHA2DS2-VASc score is 2, indicating a 2.2% annual risk of stroke.   S/p DCCV 09/17/22.  He is currently in NSR. Patient elects to continue Lopressor 12.5 mg BID and this is reasonable.   2. Secondary Hypercoagulable State (ICD10:  D68.69) The patient is at significant risk for stroke/thromboembolism based upon his CHA2DS2-VASc Score of 2.  Continue Apixaban (Eliquis).  No missed doses.   3. CML Followed by Dr Truett Perna  4. HTN Stable today.   5. CAD CAC score 57  in 2021, mild disease He is on statin.    Follow up Afib clinic prn.    Justin Mend, PA-C [facility] [address] [place], [place] [address] [phone] 07/03/2023 12:23 PM

## 2023-07-05 ENCOUNTER — Other Ambulatory Visit: Payer: Self-pay | Admitting: Oncology

## 2023-07-09 ENCOUNTER — Inpatient Hospital Stay: Admit: 2023-07-09 | Discharge: 2023-07-09 | Payer: BLUE CROSS/BLUE SHIELD | Primary: Internal Medicine

## 2023-07-09 DIAGNOSIS — I2510 Atherosclerotic heart disease of native coronary artery without angina pectoris: Principal | ICD-10-CM

## 2023-07-19 ENCOUNTER — Inpatient Hospital Stay: Admit: 2023-07-19 | Discharge: 2023-07-19 | Payer: BLUE CROSS/BLUE SHIELD | Primary: Internal Medicine

## 2023-07-19 DIAGNOSIS — I2510 Atherosclerotic heart disease of native coronary artery without angina pectoris: Secondary | ICD-10-CM

## 2023-07-29 ENCOUNTER — Encounter
Admit: 2023-07-29 | Payer: PRIVATE HEALTH INSURANCE | Attending: Vascular and Interventional Radiology | Primary: Internal Medicine

## 2023-07-31 ENCOUNTER — Encounter
Admit: 2023-07-31 | Payer: PRIVATE HEALTH INSURANCE | Attending: Student in an Organized Health Care Education/Training Program | Primary: Internal Medicine

## 2023-07-31 DIAGNOSIS — E279 Disorder of adrenal gland, unspecified: Secondary | ICD-10-CM

## 2023-08-05 ENCOUNTER — Encounter: Admit: 2023-08-05 | Payer: PRIVATE HEALTH INSURANCE | Attending: Urology | Primary: Internal Medicine

## 2023-08-05 ENCOUNTER — Encounter: Admit: 2023-08-05 | Payer: PRIVATE HEALTH INSURANCE | Attending: Internal Medicine | Primary: Internal Medicine

## 2023-08-06 MED ORDER — TADALAFIL 5 MG TABLET
5 | ORAL_TABLET | Freq: Every day | ORAL | 1 refills | Status: AC
Start: 2023-08-06 — End: ?

## 2023-08-06 NOTE — Telephone Encounter
 Annual f/u scheduled for 5/13/25Rx pended

## 2023-08-13 ENCOUNTER — Inpatient Hospital Stay: Payer: Self-pay | Attending: Oncology

## 2023-08-13 ENCOUNTER — Other Ambulatory Visit: Payer: BC Managed Care – PPO

## 2023-08-13 ENCOUNTER — Telehealth: Payer: Self-pay | Admitting: *Deleted

## 2023-08-13 DIAGNOSIS — C9211 Chronic myeloid leukemia, BCR/ABL-positive, in remission: Secondary | ICD-10-CM | POA: Diagnosis present

## 2023-08-13 DIAGNOSIS — Z79899 Other long term (current) drug therapy: Secondary | ICD-10-CM | POA: Insufficient documentation

## 2023-08-13 LAB — CBC WITH DIFFERENTIAL (CANCER CENTER ONLY)
Abs Immature Granulocytes: 0 10*3/uL (ref 0.00–0.07)
Basophils Absolute: 0.1 10*3/uL (ref 0.0–0.1)
Basophils Relative: 1 %
Eosinophils Absolute: 0.9 10*3/uL — ABNORMAL HIGH (ref 0.0–0.5)
Eosinophils Relative: 14 %
HCT: 35.1 % — ABNORMAL LOW (ref 39.0–52.0)
Hemoglobin: 11.5 g/dL — ABNORMAL LOW (ref 13.0–17.0)
Immature Granulocytes: 0 %
Lymphocytes Relative: 25 %
Lymphs Abs: 1.7 10*3/uL (ref 0.7–4.0)
MCH: 29.3 pg (ref 26.0–34.0)
MCHC: 32.8 g/dL (ref 30.0–36.0)
MCV: 89.5 fL (ref 80.0–100.0)
Monocytes Absolute: 0.6 10*3/uL (ref 0.1–1.0)
Monocytes Relative: 10 %
Neutro Abs: 3.4 10*3/uL (ref 1.7–7.7)
Neutrophils Relative %: 50 %
Platelet Count: 163 10*3/uL (ref 150–400)
RBC: 3.92 MIL/uL — ABNORMAL LOW (ref 4.22–5.81)
RDW: 13.2 % (ref 11.5–15.5)
WBC Count: 6.6 10*3/uL (ref 4.0–10.5)
nRBC: 0 % (ref 0.0–0.2)

## 2023-08-13 LAB — CMP (CANCER CENTER ONLY)
ALT: 38 U/L (ref 0–44)
AST: 34 U/L (ref 15–41)
Albumin: 4.3 g/dL (ref 3.5–5.0)
Alkaline Phosphatase: 66 U/L (ref 38–126)
Anion gap: 6 (ref 5–15)
BUN: 25 mg/dL — ABNORMAL HIGH (ref 8–23)
CO2: 29 mmol/L (ref 22–32)
Calcium: 9 mg/dL (ref 8.9–10.3)
Chloride: 104 mmol/L (ref 98–111)
Creatinine: 1.64 mg/dL — ABNORMAL HIGH (ref 0.61–1.24)
GFR, Estimated: 47 mL/min — ABNORMAL LOW (ref >60)
Glucose, Bld: 98 mg/dL (ref 70–99)
Potassium: 4.3 mmol/L (ref 3.5–5.1)
Sodium: 139 mmol/L (ref 135–145)
Total Bilirubin: 0.6 mg/dL (ref 0.0–1.2)
Total Protein: 6.8 g/dL (ref 6.5–8.1)

## 2023-08-13 NOTE — Telephone Encounter (Signed)
 Patient asked phlebotomist if MD had ordered a BCR-ABL today and he had not. Per Dr. Scherrie Curt: He will order if patient wants it done. LVM for MR. Bultema to call back or send Mychart message with his wishes.

## 2023-08-27 ENCOUNTER — Inpatient Hospital Stay (HOSPITAL_BASED_OUTPATIENT_CLINIC_OR_DEPARTMENT_OTHER): Payer: BC Managed Care – PPO | Admitting: Oncology

## 2023-08-27 VITALS — BP 127/68 | HR 73 | Temp 98.1°F | Resp 14 | Ht 67.0 in | Wt 154.6 lb

## 2023-08-27 DIAGNOSIS — C9211 Chronic myeloid leukemia, BCR/ABL-positive, in remission: Secondary | ICD-10-CM | POA: Diagnosis not present

## 2023-08-27 NOTE — Progress Notes (Signed)
  [facility] OFFICE PROGRESS NOTE   Diagnosis: CML  INTERVAL HISTORY:   [name] returns as scheduled.  He continues imatinib .  No rash or swelling.  He had a single episode of nausea/vomiting and diarrhea this morning.  He relates this to eating last night.  No consistent diarrhea. He reports having norovirus in January.  He had an episode of atrial fibrillation in February.  He reports reverting to sinus rhythm when he was started on metoprolol.  He continues follow-up with cardiology.  Objective:  Vital signs in last 24 hours:  Blood pressure 127/68, pulse 73, temperature 98.1 F (36.7 C), temperature source Temporal, resp. rate 14, height 5\' 7"  (1.702 m), weight 154 lb 9.6 oz (70.1 kg), SpO2 100%.    HEENT: No thrush or ulcers Resp: Lungs clear bilaterally Cardio: Regular rate and rhythm GI: No hepatosplenomegaly, nontender Vascular: No leg edema  Skin: No rash  Lab Results:  Lab Results  Component Value Date   WBC 6.6 08/13/2023   HGB 11.5 (L) 08/13/2023   HCT 35.1 (L) 08/13/2023   MCV 89.5 08/13/2023   PLT 163 08/13/2023   NEUTROABS 3.4 08/13/2023    CMP  Lab Results  Component Value Date   NA 139 08/13/2023   K 4.3 08/13/2023   CL 104 08/13/2023   CO2 29 08/13/2023   GLUCOSE 98 08/13/2023   BUN 25 (H) 08/13/2023   CREATININE 1.64 (H) 08/13/2023   CALCIUM 9.0 08/13/2023   PROT 6.8 08/13/2023   ALBUMIN 4.3 08/13/2023   AST 34 08/13/2023   ALT 38 08/13/2023   ALKPHOS 66 08/13/2023   BILITOT 0.6 08/13/2023   GFRNONAA 47 (L) 08/13/2023   GFRAA 51 (L) 11/09/2019    Lab Results  Component Value Date   CEA <1.00 01/29/2022    Medications: I have reviewed the patient's current medications.   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  Gleevec  discontinued April 2017, resumed in June 2017 after a rise in the peripheral blood BCR-ABL Gleevec  resumed and he again entered molecular  remission Peripheral blood PCR detectable 11/09/2019, not detected 02/09/2020 Gleevec  decreased to 200 mg daily beginning 02/24/2020 secondary to anemia/thrombocytopenia Peripheral blood PCR 09/20/2020-detected, below limit of reporting Peripheral blood FISH 01/23/2021-negative Peripheral blood PCR 07/17/2021-0.01% Peripheral blood PCR 10/30/2021-detected below the level of measurement Peripheral blood PCR 01/29/2022-not detected Peripheral blood PCR 08/07/2022-0.0064% Blood PCR 11/13/2022-not detected Peripheral blood PCR 02/12/2023-not detected Hypertension Chronic renal insufficiency Chronic eosinophilia Systolic heart murmur noted on exam 11/13/2018 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  He is tolerating the imatinib  well.  The peripheral blood PCR was negative in October 2024.  We will check a peripheral blood PCR when he is here in 3 months. He will continue follow-up with cardiology for management of atrial fibrillation.  He is scheduled to see nephrology later this week.  He will return for an office and lab visit in 6 months.  Coni Deep, MD  08/27/2023  12:34 PM

## 2023-08-28 ENCOUNTER — Telehealth: Payer: Self-pay | Admitting: Oncology

## 2023-08-28 NOTE — Telephone Encounter (Signed)
 Patient has been scheduled for follow-up visit per 08/27/23 LOS.  LVM notifying pt of appt details, provided my direct number to pt if appt changes need to be made.

## 2023-08-29 ENCOUNTER — Other Ambulatory Visit: Payer: Self-pay | Admitting: Oncology

## 2023-08-29 ENCOUNTER — Ambulatory Visit: Admit: 2023-08-29 | Payer: PRIVATE HEALTH INSURANCE | Attending: Urology | Primary: Internal Medicine

## 2023-09-01 ENCOUNTER — Encounter: Admit: 2023-09-01 | Payer: PRIVATE HEALTH INSURANCE | Primary: Internal Medicine

## 2023-09-03 ENCOUNTER — Ambulatory Visit
Admit: 2023-09-03 | Payer: BLUE CROSS/BLUE SHIELD | Attending: Student in an Organized Health Care Education/Training Program | Primary: Internal Medicine

## 2023-09-03 ENCOUNTER — Inpatient Hospital Stay: Admit: 2023-09-03 | Discharge: 2023-09-03 | Payer: BLUE CROSS/BLUE SHIELD | Primary: Internal Medicine

## 2023-09-03 ENCOUNTER — Encounter
Admit: 2023-09-03 | Payer: PRIVATE HEALTH INSURANCE | Attending: Student in an Organized Health Care Education/Training Program | Primary: Internal Medicine

## 2023-09-03 DIAGNOSIS — E279 Disorder of adrenal gland, unspecified: Secondary | ICD-10-CM

## 2023-09-03 LAB — BASIC METABOLIC PANEL
BKR ANION GAP: 13 (ref 7–17)
BKR BLOOD UREA NITROGEN: 27 mg/dL — ABNORMAL HIGH (ref 8–23)
BKR BUN / CREAT RATIO: 19.3 (ref 8.0–23.0)
BKR CALCIUM: 9.4 mg/dL (ref 8.8–10.2)
BKR CHLORIDE: 104 mmol/L (ref 98–107)
BKR CO2: 27 mmol/L (ref 20–30)
BKR CREATININE: 1.4 mg/dL — ABNORMAL HIGH (ref 0.40–1.30)
BKR EGFR, CREATININE (CKD-EPI 2021): 56 mL/min/{1.73_m2} — ABNORMAL LOW (ref >=60)
BKR GLUCOSE: 112 mg/dL — ABNORMAL HIGH (ref 70–100)
BKR POTASSIUM: 4.8 mmol/L (ref 3.3–5.3)
BKR SODIUM: 144 mmol/L (ref 136–144)

## 2023-09-03 LAB — ACTH: BKR ADRENOCORTICOTROPIC HORMONE: 16.4 pg/mL

## 2023-09-03 LAB — CORTISOL: BKR CORTISOL, PLASMA: 13.9 ug/dL

## 2023-09-09 LAB — METANEPHRINES, FRACT, FREE, LC/MS/MS, PLASMA
METANEPHRINE FREE PLASMA: 0.25 nmol/L (ref <0.50)
NORMETANEPHRINE FREE PLASMA: 0.49 nmol/L (ref <0.90)

## 2023-09-10 ENCOUNTER — Encounter: Admit: 2023-09-10 | Payer: PRIVATE HEALTH INSURANCE | Attending: Urology | Primary: Internal Medicine

## 2023-09-10 ENCOUNTER — Ambulatory Visit: Admit: 2023-09-10 | Payer: PRIVATE HEALTH INSURANCE | Attending: Urology | Primary: Internal Medicine

## 2023-09-10 VITALS — Ht 68.5 in | Wt 170.0 lb

## 2023-09-10 DIAGNOSIS — M1990 Unspecified osteoarthritis, unspecified site: Secondary | ICD-10-CM

## 2023-09-10 DIAGNOSIS — Z125 Encounter for screening for malignant neoplasm of prostate: Principal | ICD-10-CM

## 2023-09-10 DIAGNOSIS — C8590 Non-Hodgkin lymphoma, unspecified, unspecified site: Secondary | ICD-10-CM

## 2023-09-10 DIAGNOSIS — N529 Male erectile dysfunction, unspecified: Secondary | ICD-10-CM

## 2023-09-10 DIAGNOSIS — Z8042 Family history of malignant neoplasm of prostate: Secondary | ICD-10-CM

## 2023-09-10 DIAGNOSIS — N528 Other male erectile dysfunction: Secondary | ICD-10-CM

## 2023-09-10 MED ORDER — TADALAFIL 5 MG TABLET
5 | ORAL_TABLET | Freq: Every day | ORAL | 1 refills | 30.00000 days | Status: AC
Start: 2023-09-10 — End: ?

## 2023-09-10 MED ORDER — TADALAFIL 20 MG TABLET
20 | ORAL_TABLET | Freq: Every day | ORAL | 12 refills | 30.00000 days | Status: AC | PRN
Start: 2023-09-10 — End: ?

## 2023-09-10 NOTE — Progress Notes
 HPI    Patrick Casey is a 63 y.o. male who presents with a chief complaint of ED, BPH and a family hx of prostate cancer. The patient has erectile dysfunction.  This has been an issue for several years.  He has taken Cialis  20 mg daily prn with a moderate affect.  He has been taking Cialis  5 milligrams daily to treat erectile dysfunction as well as urinary symptoms related to BPH.  He feels some improvement while taking daily Cialis  5 mg and has noted improvement in his nocturia down to 2x from 3x.  Urinary flow remains moderate. Today he reports he continues to have issues with erection. Taking Cialis  20 mg PRN with minimal benefit.  He does have some stress with his wife.Pacemaker His father had prostate cancer, died from metastatic disease.Last PSA was 0.596 on 09/28/2022. PSA was 0.648 on 09/30/2021. 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   [facility] ABDOMEN PELVIS W IV CONTRAST 05/25/2022HISTORY: Sharp nonradiating left-sided flank pain since yesterday morning. Worse with movement and improved with rest. No dysuria, hematuria, trauma, fevers, vomiting, or diarrhea. Benign examinationCOMPARISON: [place] September 14, 2005TECHNIQUE: [place] images of the abdomen and pelvis were obtained from the diaphragms to the pubic symphysis after the administration of Tc intravenous contrast (Omnipaque  350).   FINDINGS:LUNG BASES: Stable scattered cystic changes are noted throughout the lung bases.  LIVER: Scattered hepatic cysts. Otherwise unremarkable.GALLBLADDER: Unremarkable.SPLEEN: Unremarkable.PANCREAS: Unremarkable.ADRENALS: Unremarkable.KIDNEYS: Unremarkable. BOWEL: There is moderate pancolonic stool burden.APPENDIX: The appendix is normal.PERITONEUM: No intraperitoneal free fluid. No intraperitoneal free air.  LYMPH NODES: Unremarkable.VESSELS: There is mild calcified atherosclerotic disease without aneurysmal dilatation. URINARY BLADDER: Slight wall thickening of the urinary bladder.PELVIS: Unremarkable. BONES & SOFT TISSUE: Unremarkable.  IMPRESSION:Moderate pancolonic stool burden with no acute abnormality in the abdomen or pelvis. Stable chronic pulmonary cystic changes. Subtle thickening of the urinary bladder. This could be secondary to slight underdistention, although should be correlated with urinalysis. Recent PSA Results:  Latest Ref Rng & Units 09/28/2022 09/30/2021 08/27/2020 06/17/2019 08/08/2017 11/02/2015 PSA RESULTS PSA 0.000 - 3.900 ng/mL 0.596  0.648  0.440  0.719  0.461  0.6       This result is from an external source.  Failed to redirect to the Timeline version of the REVFS SmartLink.Past Medical HistoryPast Medical History: Diagnosis Date  Arthritis   NHL (non-Hodgkin's lymphoma)  Past Surgical HistoryPast Surgical History: Procedure Laterality Date  fractured skull    age 83  HERNIA REPAIR    KNEE ARTHROSCOPY    left knee cartiledge repair    right hip repair    right knee cyst removal    right shoulder repair    SHOULDER SURGERY Left  AllergiesNo Known AllergiesMedicationsOutpatient Encounter Medications as of 09/10/2023 Medication Sig Dispense Refill  calcium carbonate 1250 mg (500 mg calcium) - Vitamin D3 200 Unit tablet Take 1 tablet by mouth 2 (two) times daily (0800, 1800).    doxylamine  succinate (UNISOM , DOXYLAMINE ,) 25 mg tablet Take 1 tablet (25 mg total) by mouth nightly as needed for sleep.    glucosamine-chondroitin 500-400 mg tablet Take 1 tablet by mouth 3 (three) times daily.    multivitamin (MULTIPLE VITAMIN ORAL) Take 1 tablet by mouth daily.    [DISCONTINUED] tadalafiL  (CIALIS ) 20 mg tablet Take 1 tablet (20 mg total) by mouth daily as needed for erectile dysfunction. 30 tablet 11  [DISCONTINUED] tadalafiL  (CIALIS ) 5 mg tablet TAKE ONE TABLET BY  MOUTH ONE TIME DAILY 90 tablet 0  clotrimazole -betamethasone  (LOTRISONE ) 1-0.05 % cream Apply topically 2 (two) times daily. 30 g 0  liver oil-zinc  oxide (DESITIN) ointment Apply topically as needed for dry skin. 56.7 g 0  nystatin  (MYCOSTATIN ) 100,000 unit/gram powder Apply 1 Application  topically 3 (three) times daily. 60 g 1  tadalafiL  (CIALIS ) 20 mg tablet Take 1 tablet (20 mg total) by mouth daily as needed for erectile dysfunction. 30 tablet 11  tadalafiL  (CIALIS ) 5 mg tablet Take 1 tablet (5 mg total) by mouth daily. 90 tablet 0  tamoxifen  (NOLVADEX ) 10 mg tablet Take 1 tablet (10 mg total) by mouth 2 (two) times daily.   No facility-administered encounter medications on file as of 09/10/2023.  Social HistorySocial History Tobacco Use  Smoking status: Never  Smokeless tobacco: Never Vaping Use  Vaping status: Never Used Substance Use Topics  Alcohol use: Yes   Alcohol/week: 4.0 standard drinks of alcohol   Types: 4 Cans of beer per week  Drug use: No  Family History Family History Problem Relation Age of Onset  Dementia Mother   Alcohol abuse Mother   Breast cancer Mother   Alcohol abuse Father   Prostate cancer Father   Parkinsonism Father   No Known Problems Brother   No Known Problems Brother   No Known Problems Daughter   No Known Problems Daughter   No Known Problems Son   Diabetes Son       type I Review of SystemsConstitutional: negative for weight changeCardiovascular: negative for chest painRespiratory: negative for cough or SOBAll other systems reviewed and are negative Physical ExamHt 5' 8.5  - Wt 170 lb  - BMI 25.47 kg/m?  Constitutional:  Well developed, no acute distress  EENT: Sclera normal, normal nares and mucosaRespiratory:  Normal respiratory effortCardiovascular: No evidence of extremity swellingSkin: warm and dryMusculoskeletal: Gait appears normal Neurological: well oriented to time, place, and person  Prostate: ~20 cc, no tumors, normal consistency, nontender, symmetric  Lab Results Component Value Date  UCOLOR yellow 06/09/2019  UGLUCOSE Negative 06/09/2019  UKETONE Negative 06/09/2019  USPECGRAVITY 1.015 06/09/2019  UPROTEIN Negative 06/09/2019  UNITRATES Negative 06/09/2019  UBLOOD Negative 06/09/2019  ULEUKOCYTES Negative 06/09/2019   Assessment:Patrick Casey is a 63 y.o. male with BPH and ED, family hx of CaPPlan:DiscussionBPH:Continue Cialis  5 milligrams daily to treat both BPH symptoms -Refill providedED:He can take Cialis  20 mg on a day when he is sexually active, and he will not take the 5 mg tablet that day or the following day.Prostate cancer screening:Family history of prostate cancer, father died from metastatic diseaseObtain PSA in soon - avoid sexually activity 2 to 3 days prior to PSA testFollow up in 1 year - PSA prior.Orders Placed This Encounter Procedures  PSA, total (screening) ([facility]) Return in about 1 year (around 09/09/2024).Patient will let us  know if any new problems or symptoms arise in the interim.Eden Goodpasture, MDYale [phone] for Eden Goodpasture, MD by Clarence Croak, medical scribe May 13, 2025The documentation recorded by the scribe accurately reflects the services I personally performed and the decisions made by me. I reviewed and confirmed all material entered and/or pre-charted by the scribe.

## 2023-09-11 ENCOUNTER — Inpatient Hospital Stay: Admit: 2023-09-11 | Discharge: 2023-09-11 | Payer: BLUE CROSS/BLUE SHIELD | Primary: Internal Medicine

## 2023-09-11 DIAGNOSIS — Z125 Encounter for screening for malignant neoplasm of prostate: Secondary | ICD-10-CM

## 2023-09-11 LAB — PSA, TOTAL (SCREENING) (BH GH L LMW YH): BKR PROSTATE SPECIFIC ANTIGEN, SCREENING (ROCHE): 1.11 ng/mL (ref <=4.500)

## 2023-09-15 ENCOUNTER — Encounter: Admit: 2023-09-15 | Payer: PRIVATE HEALTH INSURANCE | Primary: Internal Medicine

## 2023-09-15 ENCOUNTER — Encounter: Admit: 2023-09-15 | Payer: PRIVATE HEALTH INSURANCE | Attending: Urology | Primary: Internal Medicine

## 2023-09-15 DIAGNOSIS — R972 Elevated prostate specific antigen [PSA]: Secondary | ICD-10-CM

## 2023-09-15 NOTE — Other
 PSA 1.1, increased from baselineRepeat PSA in 6 months

## 2023-09-24 ENCOUNTER — Encounter
Admit: 2023-09-24 | Payer: PRIVATE HEALTH INSURANCE | Attending: Vascular and Interventional Radiology | Primary: Internal Medicine

## 2023-09-24 ENCOUNTER — Encounter: Admit: 2023-09-24 | Payer: PRIVATE HEALTH INSURANCE | Attending: Internal Medicine | Primary: Internal Medicine

## 2023-10-15 ENCOUNTER — Encounter: Admit: 2023-10-15 | Payer: PRIVATE HEALTH INSURANCE | Primary: Internal Medicine

## 2023-10-15 ENCOUNTER — Encounter: Admit: 2023-10-15 | Payer: PRIVATE HEALTH INSURANCE | Attending: Internal Medicine | Primary: Internal Medicine

## 2023-10-15 ENCOUNTER — Inpatient Hospital Stay: Admit: 2023-10-15 | Discharge: 2023-10-15 | Payer: BLUE CROSS/BLUE SHIELD

## 2023-10-15 DIAGNOSIS — E7800 Pure hypercholesterolemia, unspecified: Secondary | ICD-10-CM

## 2023-10-15 DIAGNOSIS — M1990 Unspecified osteoarthritis, unspecified site: Secondary | ICD-10-CM

## 2023-10-15 DIAGNOSIS — C8590 Non-Hodgkin lymphoma, unspecified, unspecified site: Secondary | ICD-10-CM

## 2023-10-15 NOTE — ED Notes
 3:07 PM Pt dc'd by provider.

## 2023-10-15 NOTE — ED Notes
 2:30 PM Chief Complaint Patient presents with  Facial Laceration   Pt reports pushing his boat trailer up his driveway and slipped and cut his chin on the metal trailer unknown last tetanus no loc no thinners PA at patient bedside, exam in progress. KAM

## 2023-10-15 NOTE — Discharge Instructions
 You were seen in the ED for a laceration.  Please return to the emergency department with any concerning symptoms such as worsening pain, fevers, red streaks around the wound, chest pain, shortness of breath.  You can wash the wound after about 24 hours; do not scrub at it but let the water (and soap if you want) run over it.  Keep the wound clean and dress it with the antibiotic ointment for the first 2 days, after that it is okay to leave it open to air.  Please see below for the timing of stitch removal - this can be done by this emergency department or another doctor such as your primary care doctor.To prevent scars, use vitamin E nightly and sunscreen daily for 1 year! - Face - 6 days

## 2023-10-15 NOTE — ED Provider Notes
 Chief Complaint Patient presents with  Facial Laceration   Pt reports pushing his boat trailer up his driveway and slipped and cut his chin on the metal trailer unknown last tetanus no loc no thinners Past Medical History: Diagnosis Date  Arthritis   NHL (non-Hodgkin's lymphoma)   Patrick Casey is a 63 y.o. male with PMHx of NHL presents emergency department today complaining of facial laceration.  Patient states that he was pushing a boat up the driveway his house, when he slipped fell forward striking in the face on the edge of metal part of the trailer.  Denies loss of consciousness, vision changes, dizziness, not on blood thinners.  Able to open and close his mouth with no distress.  Denies chest pain, shortness of breath, nausea, vomiting.Ddx:  Facial laceration, concussion, possible less likely fracture, dislocation, intracranial hemorrhage. Physical exam:Gen:  well appearing, sitting on stretcher in no acute distress.	Eyes:  PERRL, EOMI	Neuro:  no gross deficits, cranial nerves intact	Skin:  4 cm laceration to right right jaw.Plan: - laceration repair. Summary: Patient presents emergency department today with facial laceration.Laceration repaired with no complication, last Tdap was in 2022.  Patient discharged with instructions for primary care provider follow-up.  Strict return precautions discussed.Dr. Sheri Dillon available for discussion.   Rise Cheng, PA-CThis note may have been generated using M*Modal voice dictation software please excuse any typographical errors due to flaws in voice recognition.MDM  Physical ExamED Triage Vitals [10/15/23 1338]BP: (!) 158/70Pulse: (!) 49Pulse from  O2 sat: n/aResp: 16Temp: 97.4 ?F (36.3 ?C)Temp src: TemporalSpO2: 99 % BP (!) 158/70  - Pulse (!) 49  - Temp 97.4 ?F (36.3 ?C) (Temporal)  - Resp 16  - Ht 5' 8.5 (1.74 m)  - Wt 77.1 kg  - SpO2 99%  - BMI 25.47 kg/m? Physical Exam Lac RepairDate/Time: 10/15/2023 3:08 PMPerformed by: Rise Cheng, PAAuthorized by: Mechele Spiegel, Cassie Click, PA  Time out documented by nurse: noTime out unable to be performed due to emergent nature of procedure:  YesAnesthesia:   Anesthesia method:  Local infiltration  Local anesthetic:  Lidocaine  1% w/o epiLaceration details:   Location:  Face  Face location:  Chin  Length (cm):  4Repair type:   Repair type:  SimpleExploration:   Hemostasis achieved with:  Direct pressure  Wound exploration: wound explored through full range of motion    Wound extent: no foreign body    Contaminated: no  Treatment:   Area cleansed with:  Chlorhexidine and sterile water  Irrigation method:  Tap and syringe  Visualized foreign bodies/material removed: no  Skin repair:   Repair method:  Sutures  Suture size:  5-0  Suture material:  Prolene  Suture technique:  Simple interrupted  Number of sutures:  9Approximation:   Approximation:  ClosePost-procedure details:   Dressing:  Antibiotic ointment.  Patient tolerance of procedure:  Tolerated well, no immediate complications.Attestation/Critical CareClinical Impressions as of 10/15/23 1509 Facial laceration, initial encounter  ED DispositionDischarge  Rise Cheng, PA06/17/25 1509

## 2023-10-17 ENCOUNTER — Telehealth: Admit: 2023-10-17 | Payer: PRIVATE HEALTH INSURANCE | Primary: Internal Medicine

## 2023-10-17 NOTE — Telephone Encounter
 Patient called he has stiches in his chin and will not be able to use cpap till it heals.  I mentioned if when the stitches are removed if it still bothers him we can try a nasal mask.

## 2023-10-24 ENCOUNTER — Encounter
Admit: 2023-10-24 | Payer: PRIVATE HEALTH INSURANCE | Attending: Vascular and Interventional Radiology | Primary: Internal Medicine

## 2023-10-29 ENCOUNTER — Encounter
Admit: 2023-10-29 | Payer: PRIVATE HEALTH INSURANCE | Attending: Vascular and Interventional Radiology | Primary: Internal Medicine

## 2023-10-29 ENCOUNTER — Inpatient Hospital Stay: Admit: 2023-10-29 | Discharge: 2023-10-29 | Payer: BLUE CROSS/BLUE SHIELD | Primary: Internal Medicine

## 2023-10-29 ENCOUNTER — Encounter: Admit: 2023-10-29 | Payer: PRIVATE HEALTH INSURANCE | Attending: Internal Medicine | Primary: Internal Medicine

## 2023-10-29 DIAGNOSIS — W458XXA Other foreign body or object entering through skin, initial encounter: Principal | ICD-10-CM

## 2023-10-29 DIAGNOSIS — S60455A Superficial foreign body of left ring finger, initial encounter: Secondary | ICD-10-CM

## 2023-10-29 DIAGNOSIS — S60459A Superficial foreign body of unspecified finger, initial encounter: Secondary | ICD-10-CM

## 2023-10-30 ENCOUNTER — Inpatient Hospital Stay: Admit: 2023-10-30 | Discharge: 2023-10-30 | Payer: BLUE CROSS/BLUE SHIELD | Primary: Internal Medicine

## 2023-10-30 ENCOUNTER — Encounter: Admit: 2023-10-30 | Payer: PRIVATE HEALTH INSURANCE | Primary: Internal Medicine

## 2023-10-30 ENCOUNTER — Encounter: Admit: 2023-10-30 | Payer: PRIVATE HEALTH INSURANCE | Attending: Internal Medicine | Primary: Internal Medicine

## 2023-10-30 DIAGNOSIS — W458XXA Other foreign body or object entering through skin, initial encounter: Principal | ICD-10-CM

## 2023-10-30 DIAGNOSIS — M948X8 Other specified disorders of cartilage, other site: Secondary | ICD-10-CM

## 2023-10-31 ENCOUNTER — Encounter: Admit: 2023-10-31 | Payer: PRIVATE HEALTH INSURANCE | Attending: Internal Medicine | Primary: Internal Medicine

## 2023-10-31 ENCOUNTER — Inpatient Hospital Stay: Admit: 2023-10-31 | Discharge: 2023-10-31 | Payer: BLUE CROSS/BLUE SHIELD | Primary: Internal Medicine

## 2023-10-31 ENCOUNTER — Ambulatory Visit: Admit: 2023-10-31 | Payer: BLUE CROSS/BLUE SHIELD | Attending: Internal Medicine | Primary: Internal Medicine

## 2023-10-31 DIAGNOSIS — R7989 Other specified abnormal findings of blood chemistry: Secondary | ICD-10-CM

## 2023-10-31 DIAGNOSIS — E7800 Pure hypercholesterolemia, unspecified: Principal | ICD-10-CM

## 2023-10-31 LAB — COMPREHENSIVE METABOLIC PANEL
BKR A/G RATIO: 1.7 (ref 1.0–2.2)
BKR ALANINE AMINOTRANSFERASE (ALT): 43 U/L (ref 9–59)
BKR ALBUMIN: 3.9 g/dL (ref 3.6–5.1)
BKR ALKALINE PHOSPHATASE: 61 U/L (ref 9–122)
BKR ANION GAP: 11 (ref 7–17)
BKR ASPARTATE AMINOTRANSFERASE (AST): 58 U/L — ABNORMAL HIGH (ref 10–35)
BKR AST/ALT RATIO: 1.3
BKR BILIRUBIN TOTAL: 0.6 mg/dL (ref <=1.2)
BKR BLOOD UREA NITROGEN: 20 mg/dL (ref 8–23)
BKR BUN / CREAT RATIO: 15.9 (ref 8.0–23.0)
BKR CALCIUM: 9.1 mg/dL (ref 8.8–10.2)
BKR CHLORIDE: 105 mmol/L (ref 98–107)
BKR CO2: 26 mmol/L (ref 20–30)
BKR CREATININE DELTA: -0.14
BKR CREATININE: 1.26 mg/dL (ref 0.40–1.30)
BKR EGFR, CREATININE (CKD-EPI 2021): >60 mL/min/1.73m2 (ref >=60)
BKR GLOBULIN: 2.3 g/dL (ref 2.0–3.9)
BKR GLUCOSE: 93 mg/dL (ref 70–100)
BKR POTASSIUM: 4.6 mmol/L (ref 3.3–5.3)
BKR PROTEIN TOTAL: 6.2 g/dL (ref 5.9–8.3)
BKR SODIUM: 142 mmol/L (ref 136–144)

## 2023-10-31 LAB — CBC WITH AUTO DIFFERENTIAL
BKR WAM ABSOLUTE IMMATURE GRANULOCYTES.: 0.01 x 1000/ÂµL (ref 0.00–0.30)
BKR WAM ABSOLUTE LYMPHOCYTE COUNT.: 1.67 x 1000/ÂµL (ref 0.60–3.70)
BKR WAM ABSOLUTE NRBC: 0 x 1000/ÂµL (ref 0.00–1.00)
BKR WAM ANC (ABSOLUTE NEUTROPHIL COUNT): 3.17 x 1000/ÂµL (ref 2.00–7.60)
BKR WAM BASOPHIL ABSOLUTE COUNT.: 0.06 x 1000/ÂµL (ref 0.00–1.00)
BKR WAM BASOPHILS: 1 % (ref 0.0–1.4)
BKR WAM EOSINOPHIL ABSOLUTE COUNT.: 0.23 x 1000/ÂµL (ref 0.00–1.00)
BKR WAM EOSINOPHILS: 4 % (ref 0.0–5.0)
BKR WAM HEMATOCRIT: 41.3 % (ref 38.50–50.00)
BKR WAM HEMOGLOBIN: 13 g/dL — ABNORMAL LOW (ref 13.2–17.1)
BKR WAM IMMATURE GRANULOCYTES: 0.2 % (ref 0.0–1.0)
BKR WAM LYMPHOCYTES: 29.1 % (ref 17.0–50.0)
BKR WAM MCH: 31.3 pg (ref 27.0–33.0)
BKR WAM MCHC: 31.5 g/dL (ref 31.0–36.0)
BKR WAM MCV: 99.3 fL (ref 80.0–100.0)
BKR WAM MONOCYTE ABSOLUTE COUNT.: 0.6 x 1000/ÂµL (ref 0.00–1.00)
BKR WAM MONOCYTES: 10.5 % (ref 4.0–12.0)
BKR WAM MPV: 11.8 fL (ref 8.0–12.0)
BKR WAM NEUTROPHILS: 55.2 % (ref 39.0–72.0)
BKR WAM NUCLEATED RED BLOOD CELLS: 0 % (ref 0.0–1.0)
BKR WAM PLATELETS: 248 x1000/ÂµL (ref 150–420)
BKR WAM RDW-CV: 14.6 % (ref 11.0–15.0)
BKR WAM RED BLOOD CELL COUNT.: 4.16 M/ÂµL (ref 4.00–6.00)
BKR WAM WHITE BLOOD CELL COUNT: 5.7 x1000/ÂµL (ref 4.0–11.0)

## 2023-10-31 LAB — LIPID PANEL
BKR CHOLESTEROL/HDL RATIO: 2.8 (ref 0.0–5.0)
BKR CHOLESTEROL: 104 mg/dL
BKR HDL CHOLESTEROL: 37 mg/dL — ABNORMAL LOW (ref >=40)
BKR LDL CHOLESTEROL SAMPSON CALCULATED: 53 mg/dL
BKR TRIGLYCERIDES: 61 mg/dL

## 2023-11-04 ENCOUNTER — Encounter: Admit: 2023-11-04 | Payer: PRIVATE HEALTH INSURANCE | Attending: Internal Medicine | Primary: Internal Medicine

## 2023-11-04 DIAGNOSIS — R7989 Other specified abnormal findings of blood chemistry: Secondary | ICD-10-CM

## 2023-11-04 LAB — CK     (BH GH L LMW YH): BKR CREATINE KINASE TOTAL: 677 U/L — ABNORMAL HIGH (ref 39–308)

## 2023-11-05 ENCOUNTER — Encounter: Admit: 2023-11-05 | Payer: PRIVATE HEALTH INSURANCE | Attending: Internal Medicine | Primary: Internal Medicine

## 2023-11-27 ENCOUNTER — Inpatient Hospital Stay: Attending: Oncology

## 2023-11-27 DIAGNOSIS — C9211 Chronic myeloid leukemia, BCR/ABL-positive, in remission: Secondary | ICD-10-CM | POA: Diagnosis present

## 2023-11-27 LAB — CMP (CANCER CENTER ONLY)
ALT: 38 U/L (ref 0–44)
AST: 41 U/L (ref 15–41)
Albumin: 4.1 g/dL (ref 3.5–5.0)
Alkaline Phosphatase: 80 U/L (ref 38–126)
Anion gap: 10 (ref 5–15)
BUN: 22 mg/dL (ref 8–23)
CO2: 27 mmol/L (ref 22–32)
Calcium: 9.2 mg/dL (ref 8.9–10.3)
Chloride: 105 mmol/L (ref 98–111)
Creatinine: 1.72 mg/dL — ABNORMAL HIGH (ref 0.61–1.24)
GFR, Estimated: 44 mL/min — ABNORMAL LOW (ref >60)
Glucose, Bld: 78 mg/dL (ref 70–99)
Potassium: 4 mmol/L (ref 3.5–5.1)
Sodium: 141 mmol/L (ref 135–145)
Total Bilirubin: 0.4 mg/dL (ref 0.0–1.2)
Total Protein: 6.9 g/dL (ref 6.5–8.1)

## 2023-11-27 LAB — CBC WITH DIFFERENTIAL (CANCER CENTER ONLY)
Abs Immature Granulocytes: 0.01 K/uL (ref 0.00–0.07)
Basophils Absolute: 0.1 K/uL (ref 0.0–0.1)
Basophils Relative: 1 %
Eosinophils Absolute: 1.1 K/uL — ABNORMAL HIGH (ref 0.0–0.5)
Eosinophils Relative: 15 %
HCT: 34.8 % — ABNORMAL LOW (ref 39.0–52.0)
Hemoglobin: 11.4 g/dL — ABNORMAL LOW (ref 13.0–17.0)
Immature Granulocytes: 0 %
Lymphocytes Relative: 19 %
Lymphs Abs: 1.4 K/uL (ref 0.7–4.0)
MCH: 29.9 pg (ref 26.0–34.0)
MCHC: 32.8 g/dL (ref 30.0–36.0)
MCV: 91.3 fL (ref 80.0–100.0)
Monocytes Absolute: 0.8 K/uL (ref 0.1–1.0)
Monocytes Relative: 11 %
Neutro Abs: 4 K/uL (ref 1.7–7.7)
Neutrophils Relative %: 54 %
Platelet Count: 167 K/uL (ref 150–400)
RBC: 3.81 MIL/uL — ABNORMAL LOW (ref 4.22–5.81)
RDW: 12.8 % (ref 11.5–15.5)
WBC Count: 7.4 K/uL (ref 4.0–10.5)
nRBC: 0 % (ref 0.0–0.2)

## 2023-11-28 ENCOUNTER — Ambulatory Visit: Payer: Self-pay | Admitting: Oncology

## 2023-12-03 LAB — BCR-ABL1, CML/ALL, PCR, QUANT
E1A2 Transcript: <0.0032 %
Interpretation (BCRAL):: NEGATIVE
b2a2 transcript: <0.0032 %
b3a2 transcript: <0.0032 %

## 2023-12-04 ENCOUNTER — Encounter: Admit: 2023-12-04 | Payer: PRIVATE HEALTH INSURANCE | Primary: Internal Medicine

## 2023-12-05 ENCOUNTER — Other Ambulatory Visit: Payer: Self-pay | Admitting: *Deleted

## 2023-12-05 NOTE — Progress Notes (Signed)
 error

## 2023-12-05 NOTE — Telephone Encounter (Signed)
 Patient voiced understanding, no further questions at this time.

## 2023-12-06 ENCOUNTER — Encounter: Admit: 2023-12-06 | Payer: PRIVATE HEALTH INSURANCE | Attending: Urology | Primary: Internal Medicine

## 2023-12-06 DIAGNOSIS — N400 Enlarged prostate without lower urinary tract symptoms: Principal | ICD-10-CM

## 2023-12-06 MED ORDER — TADALAFIL 5 MG TABLET
5 | ORAL_TABLET | Freq: Every day | ORAL | 4 refills | 30.00000 days | Status: AC
Start: 2023-12-06 — End: ?

## 2023-12-13 ENCOUNTER — Ambulatory Visit: Admit: 2023-12-13 | Payer: PRIVATE HEALTH INSURANCE | Attending: Critical Care Medicine | Primary: Internal Medicine

## 2023-12-13 ENCOUNTER — Encounter: Admit: 2023-12-13 | Payer: PRIVATE HEALTH INSURANCE | Attending: Critical Care Medicine | Primary: Internal Medicine

## 2023-12-13 VITALS — BP 107/65 | HR 56 | Resp 18 | Ht 68.5 in | Wt 175.0 lb

## 2023-12-13 DIAGNOSIS — M1990 Unspecified osteoarthritis, unspecified site: Secondary | ICD-10-CM

## 2023-12-13 DIAGNOSIS — C8590 Non-Hodgkin lymphoma, unspecified, unspecified site: Principal | ICD-10-CM

## 2023-12-13 DIAGNOSIS — G4733 Obstructive sleep apnea (adult) (pediatric): Principal | ICD-10-CM

## 2023-12-13 NOTE — Patient Instructions
 [facility] DENTISTSNew HavenDr. Charlann Confer, DMD             [facility] 					[address], [address]                                                         					phone: [phone], Fax: [phone]. Bulah Caro, DMD               [address] [address]                                                            [phone]  [place]ilfordDr. Fredy Jericho, DMD                  [address], [address] [place], [address]                                                            [phone], fax [phone]. Alvena Aurora, DMD		[address]     [place], [place] [phone], fax: [phone] [place]wichDr. Elyn Han, DDS                     [address], [address]                                                            [phone], fax [phone]            [address] [place], [place] [address] Dr. Susy Erie, DMD                 [phone],  fax [phone] is a website to check if a dentist is American Academy of Dental Sleep Medicine (AADSM) certified for making oral appliances for sleep apnea: https://mms.aadsm.org/members/directory/search_bootstrap.php?org_id=ADSM

## 2023-12-18 NOTE — Progress Notes
 [facility] MedicineY[facility] MedicineY[facility] [address], [place], [place] [id number], [address], [place], [place] [address]E[place]wz: [phone]         Fax: [phone] Maricela, MD - Boby Hammonds, MBA, PA-C - Clarita Linger, MD - Eleanor Shine, MD, PhDBrian Arvil, MD - Darien Dress, MD - Vahid Mohsenin, MDLynelle Duquesne, PsyD - Tinnie Dasen, MD - Wanda Messier, MD, MS, Medical Director HILARIO Kendall Flack, MD, MPH, DirectorPatient Name:  Patrick Casey of Birth:  December 05, 1962Date of Service: 8/15/2025EPIC MRN:  [id number] Provider:  Antonina Stank, MDHistory Of Present Illness:Patrick Casey  is a 63 y.o.-year-old male who is here for follow-up for sleep apnea. Find it wakes him up frequently through the night. Pressure can be high. Benefits of CPAP: less sleepy the machineIssues with tolerating PAP: intermittent mask leak, sleeping with it on, pressure high, tubing can be tangled, no aerophagia, feels like he needs a little higher Can have trouble sleeping through the night with the CPAP. Sometimes takes the CPAP off. Caffeine: only in the morning - 3 or 4 cups of coffee in the AM (5-6 AM)Driving: no drowsy driving (had it in the past)Epworth Sleepiness Epworth Total Score: (Patient-Rptd) 18 -- this was during the school year, not currently Sleep-Wake schedule: Bedtime: go to bed around 8-830 PM, right away to fall asleep Number of awakenings: multiple awakenings - mostly from CPAP, or just wake up (light) -- time to go back to sleep is quickWaketime: 3-4 AM, 5 AM sometimes Naps: none recently Average total sleep time (in a 24 hour period): 7-8 hours. No morning headaches. Wake up feeling groggy sometimes, sometimes okay. Sleep on his side. Review of Systems:Is as above.Otherwise negative in full system review.Immunization History Administered Date(s) Administered  COVID-19, MODERNA 12Y+, 0.5 mL 07/01/2019, 12/04/2019, 05/16/2020  H1N1 for historical documentation only 04/13/2008  Hep A, adult 04/07/2004, 12/16/2007  Influenza, injectable, quadrivalent, preservative free 01/08/2017, 03/30/2019, 02/23/2020, 02/16/2021, 02/26/2022  Pneumococcal conjugate PCV 13 04/23/2019  Pneumococcal polysaccharide PPSV23 03/07/2005, 04/28/2020  Tdap 04/08/2007, 09/06/2020  ZOSTER RECOMBINANT (Shingrix) 01/08/2017, 03/27/2017 Past Medical History :He  has a past medical history of Arthritis and NHL (non-Hodgkin's lymphoma). Social History:  He  reports that he has never smoked. He has never used smokeless tobacco. He reports current alcohol use of about 4.0 standard drinks of alcohol per week. He reports that he does not use drugs.Family History:  He family history includes Alcohol abuse in his father and mother; Breast cancer in his mother; Dementia in his mother; Diabetes in his son; No Known Problems in his brother, brother, daughter, daughter, and son; Parkinsonism in his father; Prostate cancer in his father.Allergies:He has no known allergies.Medications:Encounter Medications[1]Physical Examination:Vital signs: Blood pressure 107/65, pulse (!) 56, resp. rate 18, height 5' 8.5 (1.74 m), weight 79.4 kg, SpO2 97%. Body mass index is 26.22 kg/m?.   11/28/2022   9:00 AM Neck Circumference  Neck Circumference 16.25  General: well appearing, nad, no conversational or ambulatory dyspneaHEENT:  anicteric, no significant rhinorrheaNeuro: nonfocal exam, normal gait, no tremorsSkin: no significant rashesPsychiatric: mood and affect normal  REVIEW OF DATA:PRIOR SLEEP TESTING:  HST September 2024:IMPRESSION:- Obstructive Sleep Apnea (G47.33): Overall REI of 7.2 (3%) /hr and 6.1 (4%) /hr, arterial oxygen saturation nadir of 73 %. Supine REI was 37.5DME: LincareMachine type and settings:  CPAP of 5-20 cmH2O Mask type: FFMCompliance reviewed:LABS:Lab Results Component Value Date  WBC 5.7 10/31/2023  HGB 13.0 (L) 10/31/2023  HCT 41.30 10/31/2023  MCV 99.3 10/31/2023  PLT 248 10/31/2023 Lab Results Component Value Date  CREATININE 1.26 10/31/2023  BUN 20 10/31/2023  NA 142 10/31/2023  K 4.6 10/31/2023  CL 105 10/31/2023  CO2 26 10/31/2023 Lab Results Component Value Date  TSH 2.860 03/24/2022 Lab Results Component Value Date  IRON 64 12/04/2017  TIBC 306 12/04/2017  FERRITIN 73 09/06/2020 Recent Results (from the past 730 days)ECHOCARDIOGRAM COMPLETE (TTE) 07/19/2023 (Final)Narrative* Quality of the study was good.* Normal left ventricular size, wall thickness, systolic function and wall motion. LVEF calculated by 3DE was 60%.  Global longitudinal strain was -20%.  Normal diastolic function and filling pressures.* Normal right ventricular cavity size and systolic function.* Atria are normal in size.* Aortic sclerosis without stenosis.* Moderate posterior mitral annular calcification.* All visible segments of the aorta are normal in size.* No significant pericardial effusion.* No prior study available for comparison.FindingsResult ReportReported 3DE EF%: 60 %Impression/Plan:This is a 63 y.o.-year-old male with a PMH of BPH, ED, and Hx of follicular lymphoma who presents to clinic for follow-up for sleep apnea. 1) OSA on CPAP: Prior sleep study on 12/2022 showed mild OSA with an SpO2 nadir of 73%. Patient is on autoCPAP of 5-20 cmH20 with compliance of 97% and rAHI of 1.7/hr. Benefiting from CPAP, with less daytime sleepiness. However, finds CPAP can interfere with his sleep. - continue CPAP with adjusted settings of 7 to 20 cmH20	- supplies order 	- mask fitting or can try dreamware full face mask or F30i fit pack - reviewed the reasons for sleep apnea treatment and insurance criteria for CPAP- can consider oral appliance if change in mask does not help with sleep disruption; referral to dentistry madeStatement re PAP therapy per CMS requirements: The patient is compliant with PAP therapy per CMS guidelines.The patient is benefiting from PAP therapy with improved symptoms. Follow up: ~6 months It is a pleasure to participate in the care of this patient.  Please feel free to contact me with any questions or concerns.Sincerely,Athanasios Heldman Dennise, MDElectronically signed by Antonina Dennise, MD, December 13, 2023 at 11:04 AMOn the day of this patient's encounter, a total of 24 minutes was personally spent by me.  This does not include any resident/fellow teaching time, or any time spent performing a procedural service. [1] Outpatient Encounter Medications as of 12/13/2023 Medication Sig Dispense Refill  calcium carbonate 1250 mg (500 mg calcium) - Vitamin D3 200 Unit tablet Take 1 tablet by mouth 2 (two) times daily (0800, 1800).    clotrimazole -betamethasone  (LOTRISONE ) 1-0.05 % cream Apply topically 2 (two) times daily. 30 g 0  doxylamine  succinate (UNISOM , DOXYLAMINE ,) 25 mg tablet Take 1 tablet (25 mg total) by mouth nightly as needed for sleep.    glucosamine-chondroitin 500-400 mg tablet Take 1 tablet by mouth 3 (three) times daily.    liver oil-zinc  oxide (DESITIN) ointment Apply topically as needed for dry skin. 56.7 g 0  multivitamin (MULTIPLE VITAMIN ORAL) Take 1 tablet by mouth daily.    nystatin  (MYCOSTATIN ) 100,000 unit/gram powder Apply 1 Application  topically 3 (three) times daily. 60 g 1  tadalafiL  (CIALIS ) 20 mg tablet Take 1 tablet (20 mg total) by mouth daily as needed for erectile dysfunction. 30 tablet 11  tadalafiL  (CIALIS ) 5 mg tablet Take 1 tablet (5 mg total) by mouth daily. 90 tablet 3  tamoxifen  (NOLVADEX ) 10 mg tablet Take 1 tablet (10 mg total) by mouth 2 (two) times daily.   No facility-administered encounter medications on file as of 12/13/2023.

## 2023-12-26 ENCOUNTER — Other Ambulatory Visit: Payer: Self-pay | Admitting: Oncology

## 2023-12-30 ENCOUNTER — Encounter: Payer: Self-pay | Admitting: Oncology

## 2024-01-01 ENCOUNTER — Encounter
Admit: 2024-01-01 | Payer: PRIVATE HEALTH INSURANCE | Attending: Vascular and Interventional Radiology | Primary: Internal Medicine

## 2024-02-12 ENCOUNTER — Encounter: Admit: 2024-02-12 | Payer: PRIVATE HEALTH INSURANCE | Attending: Urology | Primary: Internal Medicine

## 2024-02-12 ENCOUNTER — Ambulatory Visit: Admit: 2024-02-12 | Payer: BLUE CROSS/BLUE SHIELD | Attending: Urology | Primary: Internal Medicine

## 2024-02-12 ENCOUNTER — Inpatient Hospital Stay: Admit: 2024-02-12 | Discharge: 2024-02-12 | Payer: BLUE CROSS/BLUE SHIELD | Primary: Internal Medicine

## 2024-02-12 DIAGNOSIS — R972 Elevated prostate specific antigen [PSA]: Principal | ICD-10-CM

## 2024-02-12 LAB — PSA, TOTAL AND FREE
BKR PROSTATE SPECIFIC ANTIGEN FREE (ROCHE): 0.257 ng/mL (ref <4.000)
BKR PROSTATE SPECIFIC ANTIGEN, TOTAL (ROCHE): 0.996 ng/mL (ref <=4.500)

## 2024-02-13 ENCOUNTER — Encounter: Admit: 2024-02-13 | Payer: PRIVATE HEALTH INSURANCE | Attending: Urology | Primary: Internal Medicine

## 2024-02-13 NOTE — Other
 PSA stable

## 2024-02-27 ENCOUNTER — Inpatient Hospital Stay: Attending: Oncology | Admitting: Oncology

## 2024-02-27 ENCOUNTER — Inpatient Hospital Stay

## 2024-02-27 VITALS — BP 125/67 | HR 67 | Temp 98.6°F | Resp 18 | Ht 67.0 in | Wt 159.5 lb

## 2024-02-27 DIAGNOSIS — N189 Chronic kidney disease, unspecified: Secondary | ICD-10-CM | POA: Diagnosis not present

## 2024-02-27 DIAGNOSIS — D649 Anemia, unspecified: Secondary | ICD-10-CM | POA: Insufficient documentation

## 2024-02-27 DIAGNOSIS — I129 Hypertensive chronic kidney disease with stage 1 through stage 4 chronic kidney disease, or unspecified chronic kidney disease: Secondary | ICD-10-CM | POA: Diagnosis not present

## 2024-02-27 DIAGNOSIS — I4891 Unspecified atrial fibrillation: Secondary | ICD-10-CM | POA: Diagnosis not present

## 2024-02-27 DIAGNOSIS — C9211 Chronic myeloid leukemia, BCR/ABL-positive, in remission: Secondary | ICD-10-CM

## 2024-02-27 DIAGNOSIS — C9210 Chronic myeloid leukemia, BCR/ABL-positive, not having achieved remission: Secondary | ICD-10-CM | POA: Diagnosis present

## 2024-02-27 DIAGNOSIS — D721 Eosinophilia, unspecified: Secondary | ICD-10-CM | POA: Diagnosis not present

## 2024-02-27 DIAGNOSIS — D696 Thrombocytopenia, unspecified: Secondary | ICD-10-CM | POA: Insufficient documentation

## 2024-02-27 DIAGNOSIS — R011 Cardiac murmur, unspecified: Secondary | ICD-10-CM | POA: Insufficient documentation

## 2024-02-27 LAB — CMP (CANCER CENTER ONLY)
ALT: 45 U/L — ABNORMAL HIGH (ref 0–44)
AST: 48 U/L — ABNORMAL HIGH (ref 15–41)
Albumin: 4.4 g/dL (ref 3.5–5.0)
Alkaline Phosphatase: 80 U/L (ref 38–126)
Anion gap: 7 (ref 5–15)
BUN: 23 mg/dL (ref 8–23)
CO2: 28 mmol/L (ref 22–32)
Calcium: 9.7 mg/dL (ref 8.9–10.3)
Chloride: 104 mmol/L (ref 98–111)
Creatinine: 1.61 mg/dL — ABNORMAL HIGH (ref 0.61–1.24)
GFR, Estimated: 47 mL/min — ABNORMAL LOW (ref >60)
Glucose, Bld: 100 mg/dL — ABNORMAL HIGH (ref 70–99)
Potassium: 4.6 mmol/L (ref 3.5–5.1)
Sodium: 139 mmol/L (ref 135–145)
Total Bilirubin: 0.5 mg/dL (ref 0.0–1.2)
Total Protein: 7.1 g/dL (ref 6.5–8.1)

## 2024-02-27 LAB — CBC WITH DIFFERENTIAL (CANCER CENTER ONLY)
Abs Immature Granulocytes: 0.01 K/uL (ref 0.00–0.07)
Basophils Absolute: 0.1 K/uL (ref 0.0–0.1)
Basophils Relative: 1 %
Eosinophils Absolute: 0.9 K/uL — ABNORMAL HIGH (ref 0.0–0.5)
Eosinophils Relative: 13 %
HCT: 35.2 % — ABNORMAL LOW (ref 39.0–52.0)
Hemoglobin: 11.5 g/dL — ABNORMAL LOW (ref 13.0–17.0)
Immature Granulocytes: 0 %
Lymphocytes Relative: 26 %
Lymphs Abs: 1.8 K/uL (ref 0.7–4.0)
MCH: 29.9 pg (ref 26.0–34.0)
MCHC: 32.7 g/dL (ref 30.0–36.0)
MCV: 91.7 fL (ref 80.0–100.0)
Monocytes Absolute: 0.8 K/uL (ref 0.1–1.0)
Monocytes Relative: 11 %
Neutro Abs: 3.6 K/uL (ref 1.7–7.7)
Neutrophils Relative %: 49 %
Platelet Count: 163 K/uL (ref 150–400)
RBC: 3.84 MIL/uL — ABNORMAL LOW (ref 4.22–5.81)
RDW: 13 % (ref 11.5–15.5)
WBC Count: 7.2 K/uL (ref 4.0–10.5)
nRBC: 0 % (ref 0.0–0.2)

## 2024-02-27 NOTE — Progress Notes (Signed)
  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  He continues Gleevec .  No rash, diarrhea, or swelling.  He feels well.  He has received influenza and COVID 19 vaccines.  Objective:  Vital signs in last 24 hours:  Blood pressure 125/67, pulse 67, temperature 98.6 F (37 C), temperature source Temporal, resp. rate 18, height 5' 7 (1.702 m), weight 159 lb 8 oz (72.3 kg), SpO2 100%.    HEENT: No thrush or ulcers Resp: Lungs clear bilaterally Cardio: Regular rate and rhythm GI: No hepatosplenomegaly Vascular: No leg edema  Skin: No rash   Lab Results:  Lab Results  Component Value Date   WBC 7.2 02/27/2024   HGB 11.5 (L) 02/27/2024   HCT 35.2 (L) 02/27/2024   MCV 91.7 02/27/2024   PLT 163 02/27/2024   NEUTROABS 3.6 02/27/2024    CMP  Lab Results  Component Value Date   NA 141 11/27/2023   K 4.0 11/27/2023   CL 105 11/27/2023   CO2 27 11/27/2023   GLUCOSE 78 11/27/2023   BUN 22 11/27/2023   CREATININE 1.72 (H) 11/27/2023   CALCIUM 9.2 11/27/2023   PROT 6.9 11/27/2023   ALBUMIN 4.1 11/27/2023   AST 41 11/27/2023   ALT 38 11/27/2023   ALKPHOS 80 11/27/2023   BILITOT 0.4 11/27/2023   GFRNONAA 44 (L) 11/27/2023   GFRAA 51 (L) 11/09/2019    Lab Results  Component Value Date   CEA <1.00 01/29/2022    Medications: I have reviewed the patient's current medications.   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  Gleevec  discontinued April 2017, resumed in June 2017 after a rise in the peripheral blood BCR-ABL Gleevec  resumed and he again entered molecular remission Peripheral blood PCR detectable 11/09/2019, not detected 02/09/2020 Gleevec  decreased to 200 mg daily beginning 02/24/2020 secondary to anemia/thrombocytopenia Peripheral blood PCR 09/20/2020-detected, below limit of reporting Peripheral blood FISH 01/23/2021-negative Peripheral blood PCR  07/17/2021-0.01% Peripheral blood PCR 10/30/2021-detected below the level of measurement Peripheral blood PCR 01/29/2022-not detected Peripheral blood PCR 08/07/2022-0.0064% Blood PCR 11/13/2022-not detected Peripheral blood PCR 02/12/2023-not detected Peripheral blood PCR 11/27/2023-not detected Hypertension Chronic renal insufficiency Chronic eosinophilia Systolic heart murmur noted on exam 11/13/2018 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  He will return for lab visit to include a peripheral blood PCR in 3 months.  He continues follow-up with Dr. Shayne for internal medicine care and he is followed by nephrology for renal insufficiency.  We will follow-up on the chemistry panel from today.  He will be scheduled for an office and lab visit in 6 months.  Arley Hof, MD  02/27/2024  11:39 AM

## 2024-03-06 ENCOUNTER — Other Ambulatory Visit (HOSPITAL_BASED_OUTPATIENT_CLINIC_OR_DEPARTMENT_OTHER): Payer: Self-pay

## 2024-03-11 ENCOUNTER — Encounter: Payer: Self-pay | Admitting: *Deleted

## 2024-03-13 ENCOUNTER — Telehealth: Payer: Self-pay | Admitting: Pharmacy Technician

## 2024-03-13 ENCOUNTER — Other Ambulatory Visit (HOSPITAL_COMMUNITY): Payer: Self-pay

## 2024-03-13 NOTE — Telephone Encounter (Signed)
 Oral Oncology Patient Advocate Encounter  Received email from Devere Leaven, RN, with form attached stating prior authorization is being requested  After completing a benefits investigation, prior authorization for Imatinib  is not required at this time through [facility].  Patient is filling through [employer].   Patrick Nash (Patty) Chet Burnet, CPhT  [facility], Zelda Salmon, [facility] - Oral Chemotherapy Patient Advocate Specialist III Phone: [phone]  Fax: [phone]

## 2024-03-17 ENCOUNTER — Encounter
Admit: 2024-03-17 | Payer: PRIVATE HEALTH INSURANCE | Attending: Vascular and Interventional Radiology | Primary: Internal Medicine

## 2024-03-24 ENCOUNTER — Encounter
Admit: 2024-03-24 | Payer: PRIVATE HEALTH INSURANCE | Attending: Vascular and Interventional Radiology | Primary: Internal Medicine

## 2024-03-30 ENCOUNTER — Encounter: Payer: Self-pay | Admitting: Cardiology

## 2024-04-02 NOTE — Telephone Encounter (Signed)
 Pt called in stating he is back in sinus rhythm and no longer wants the appt

## 2024-04-24 ENCOUNTER — Other Ambulatory Visit: Payer: Self-pay | Admitting: Oncology

## 2024-04-27 ENCOUNTER — Encounter
Admit: 2024-04-27 | Payer: PRIVATE HEALTH INSURANCE | Attending: Vascular and Interventional Radiology | Primary: Internal Medicine

## 2024-04-28 ENCOUNTER — Encounter
Admit: 2024-04-28 | Payer: PRIVATE HEALTH INSURANCE | Attending: Vascular and Interventional Radiology | Primary: Internal Medicine

## 2024-05-07 ENCOUNTER — Other Ambulatory Visit (HOSPITAL_COMMUNITY): Payer: Self-pay

## 2024-05-07 ENCOUNTER — Telehealth: Payer: Self-pay | Admitting: Pharmacy Technician

## 2024-05-07 ENCOUNTER — Telehealth: Payer: Self-pay | Admitting: *Deleted

## 2024-05-07 NOTE — Telephone Encounter (Signed)
 Oral Oncology Patient Advocate Encounter   Change in insurance   Received notification that prior authorization for imatinib  is required.   PA submitted on CMM via [employer] [id number] Status is pending     Nikesh Teschner (Patty) Chet Burnet, CPhT  [facility], Zelda Salmon, [facility] - Oral Chemotherapy Patient Advocate Specialist III Phone: [phone]  Fax: [phone]

## 2024-05-07 NOTE — Telephone Encounter (Signed)
 VM from [facility] that prior authorization is needed for his imatinib  400 mg tablet. Forwarded message to oral oncology team.

## 2024-05-08 ENCOUNTER — Other Ambulatory Visit (HOSPITAL_COMMUNITY): Payer: Self-pay

## 2024-05-08 NOTE — Telephone Encounter (Signed)
 Oral Oncology Patient Advocate Encounter  Prior Authorization for imatinib  has been approved.    PA# [id number] Effective dates: 05/07/2024 through 05/07/2025  Patient can continue filling through [facility]  [place] (Patty) Chet Burnet, CPhT  [facility], Zelda Salmon, [place] [facility] - Oral Chemotherapy Patient Advocate Specialist III Phone: [phone]  Fax: [phone]

## 2024-05-11 ENCOUNTER — Telehealth: Payer: Self-pay | Admitting: *Deleted

## 2024-05-11 DIAGNOSIS — C9211 Chronic myeloid leukemia, BCR/ABL-positive, in remission: Secondary | ICD-10-CM

## 2024-05-11 MED ORDER — IMATINIB MESYLATE 400 MG PO TABS: 200.0000 mg | ORAL_TABLET | Freq: Every day | ORAL | 5 refills | Status: DC

## 2024-05-11 NOTE — Telephone Encounter (Signed)
 Mr. Riviera called to report his CHARON will not allow him to use the [facility] in [place] . Now need to use the one in [place]. Script re-sent to [facility] as requested.

## 2024-05-12 ENCOUNTER — Other Ambulatory Visit (HOSPITAL_COMMUNITY): Payer: Self-pay

## 2024-05-12 ENCOUNTER — Other Ambulatory Visit: Payer: Self-pay

## 2024-05-13 ENCOUNTER — Other Ambulatory Visit: Payer: Self-pay

## 2024-05-13 ENCOUNTER — Other Ambulatory Visit (HOSPITAL_COMMUNITY): Payer: Self-pay

## 2024-05-13 ENCOUNTER — Other Ambulatory Visit: Payer: Self-pay | Admitting: Pharmacy Technician

## 2024-05-13 MED ORDER — IMATINIB MESYLATE 100 MG PO TABS
200.0000 mg | ORAL_TABLET | Freq: Every day | ORAL | 5 refills | Status: AC
  Filled 2024-05-13: qty 30, 15d supply, fill #0
  Filled 2024-05-22 – 2024-05-25 (×2): qty 30, 15d supply, fill #1
  Filled 2024-06-05: qty 30, 15d supply, fill #2

## 2024-05-13 NOTE — Telephone Encounter (Signed)
 Patient contacted [facility] (Specialty) to request this medication be filled there.  Imatinib  prescription redirected to [facility] (Specialty).  Patient would also like to switch to 100 mg tablet strength, prescription updated to reflect this patient request.  Of note patient's new insurance is only allowing patient to fill 15-day supply, eventually they should allow a 30-day supply.  Patient is aware of this insurance restriction.  Expected medication delivery 05/15/2024.

## 2024-05-13 NOTE — Progress Notes (Signed)
 Specialty Pharmacy Initial Fill Coordination Note  Patrick Nash is a 64 y.o. male contacted today regarding initial fill of specialty medication(s) Imatinib  Mesylate (GLEEVEC )   Patient requested Delivery   Delivery date: 05/15/24   Verified address: [address] [place] [phone]   Medication will be filled on: 05/14/24   Patient is aware of $18.36 copayment.   Patrick Nash (Patrick) [name], CPhT  [facility], Patrick Nash, [facility] - Oral Chemotherapy Patient Advocate Specialist III Phone: [phone]  Fax: [phone]

## 2024-05-13 NOTE — Addendum Note (Signed)
 Addended by: RODGERS RENAEE SAILOR on: 05/13/2024 11:21 AM   Modules accepted: Orders

## 2024-05-13 NOTE — Progress Notes (Signed)
 Specialty Pharmacy Initiation Note   Patrick Nash is a 64 y.o. male who will be followed by the specialty pharmacy service for RxSp Oncology    Review of administration, indication, effectiveness, safety, potential side effects, storage/disposable, and missed dose instructions occurred today for patient's specialty medication(s) No data recorded    Previously filling at a different pharmacy but is transitioning to filling medication at [facility] (Specialty)  Patient/Caregiver did not have any additional questions or concerns.   Patient's therapy is appropriate to: Continue    Goals Addressed   None     Patrick Nash N Elnor Renovato Specialty Pharmacist

## 2024-05-14 ENCOUNTER — Other Ambulatory Visit (HOSPITAL_COMMUNITY): Payer: Self-pay

## 2024-05-14 ENCOUNTER — Other Ambulatory Visit: Payer: Self-pay

## 2024-05-22 ENCOUNTER — Other Ambulatory Visit (HOSPITAL_COMMUNITY): Payer: Self-pay

## 2024-05-25 ENCOUNTER — Other Ambulatory Visit: Payer: Self-pay

## 2024-05-26 ENCOUNTER — Other Ambulatory Visit (HOSPITAL_BASED_OUTPATIENT_CLINIC_OR_DEPARTMENT_OTHER): Payer: Self-pay

## 2024-05-26 ENCOUNTER — Encounter (INDEPENDENT_AMBULATORY_CARE_PROVIDER_SITE_OTHER): Payer: Self-pay

## 2024-05-27 ENCOUNTER — Other Ambulatory Visit (HOSPITAL_COMMUNITY): Payer: Self-pay

## 2024-05-27 NOTE — Progress Notes (Signed)
 Specialty Pharmacy Refill Coordination Note  Patrick Nash is a 64 y.o. male contacted today regarding refills of specialty medication(s) Imatinib  Mesylate (GLEEVEC )   Patient requested Delivery   Delivery date: 05/29/24   Verified address: [address], [place], [place] [address]   Medication will be filled on: 05/28/24

## 2024-05-28 ENCOUNTER — Inpatient Hospital Stay: Attending: Oncology

## 2024-05-28 ENCOUNTER — Other Ambulatory Visit: Payer: Self-pay

## 2024-05-28 DIAGNOSIS — C9211 Chronic myeloid leukemia, BCR/ABL-positive, in remission: Secondary | ICD-10-CM

## 2024-05-28 LAB — CBC WITH DIFFERENTIAL (CANCER CENTER ONLY)
Abs Immature Granulocytes: 0.01 10*3/uL (ref 0.00–0.07)
Basophils Absolute: 0.1 10*3/uL (ref 0.0–0.1)
Basophils Relative: 1 %
Eosinophils Absolute: 0.8 10*3/uL — ABNORMAL HIGH (ref 0.0–0.5)
Eosinophils Relative: 13 %
HCT: 35.8 % — ABNORMAL LOW (ref 39.0–52.0)
Hemoglobin: 11.8 g/dL — ABNORMAL LOW (ref 13.0–17.0)
Immature Granulocytes: 0 %
Lymphocytes Relative: 24 %
Lymphs Abs: 1.5 10*3/uL (ref 0.7–4.0)
MCH: 30.1 pg (ref 26.0–34.0)
MCHC: 33 g/dL (ref 30.0–36.0)
MCV: 91.3 fL (ref 80.0–100.0)
Monocytes Absolute: 0.8 10*3/uL (ref 0.1–1.0)
Monocytes Relative: 12 %
Neutro Abs: 3.2 10*3/uL (ref 1.7–7.7)
Neutrophils Relative %: 50 %
Platelet Count: 157 10*3/uL (ref 150–400)
RBC: 3.92 MIL/uL — ABNORMAL LOW (ref 4.22–5.81)
RDW: 12.6 % (ref 11.5–15.5)
WBC Count: 6.4 10*3/uL (ref 4.0–10.5)
nRBC: 0 % (ref 0.0–0.2)

## 2024-05-28 LAB — CMP (CANCER CENTER ONLY)
ALT: 49 U/L — ABNORMAL HIGH (ref 0–44)
AST: 47 U/L — ABNORMAL HIGH (ref 15–41)
Albumin: 4.1 g/dL (ref 3.5–5.0)
Alkaline Phosphatase: 82 U/L (ref 38–126)
Anion gap: 8 (ref 5–15)
BUN: 20 mg/dL (ref 8–23)
CO2: 28 mmol/L (ref 22–32)
Calcium: 9.6 mg/dL (ref 8.9–10.3)
Chloride: 103 mmol/L (ref 98–111)
Creatinine: 1.46 mg/dL — ABNORMAL HIGH (ref 0.61–1.24)
GFR, Estimated: 53 mL/min — ABNORMAL LOW
Glucose, Bld: 88 mg/dL (ref 70–99)
Potassium: 4.4 mmol/L (ref 3.5–5.1)
Sodium: 140 mmol/L (ref 135–145)
Total Bilirubin: 0.4 mg/dL (ref 0.0–1.2)
Total Protein: 6.9 g/dL (ref 6.5–8.1)

## 2024-06-02 ENCOUNTER — Other Ambulatory Visit: Payer: Self-pay

## 2024-06-03 ENCOUNTER — Other Ambulatory Visit: Payer: Self-pay

## 2024-06-03 LAB — BCR-ABL1, CML/ALL, PCR, QUANT
E1A2 Transcript: <0.0032 %
Interpretation (BCRAL):: NEGATIVE
b2a2 transcript: <0.0032 %
b3a2 transcript: <0.0032 %

## 2024-06-04 ENCOUNTER — Ambulatory Visit: Payer: Self-pay | Admitting: Oncology

## 2024-06-05 ENCOUNTER — Other Ambulatory Visit: Payer: Self-pay

## 2024-06-05 NOTE — Telephone Encounter (Signed)
 Notified Mr. Sitar that peripheral blood PCR is negative, continue Gleevec , follow-up as scheduled

## 2024-06-05 NOTE — Telephone Encounter (Signed)
-----   Message from Arley Hof, MD sent at 06/04/2024  4:11 PM EST ----- Please call patient, the peripheral blood PCR is negative, continue Gleevec , follow-up as scheduled

## 2024-07-27 ENCOUNTER — Ambulatory Visit: Admit: 2024-07-27 | Payer: PRIVATE HEALTH INSURANCE | Attending: Critical Care Medicine | Primary: Internal Medicine

## 2024-08-27 ENCOUNTER — Inpatient Hospital Stay: Admitting: Oncology

## 2024-08-27 ENCOUNTER — Inpatient Hospital Stay

## 2024-09-14 ENCOUNTER — Ambulatory Visit: Admit: 2024-09-14 | Payer: PRIVATE HEALTH INSURANCE | Attending: Urology | Primary: Internal Medicine
